# Patient Record
Sex: Female | Born: 1940 | Race: White | Hispanic: No | Smoking: Never smoker
Health system: Southern US, Community
[De-identification: ages and names within clinical notes are randomized; demographics above are authoritative.]

## PROBLEM LIST (undated history)

## (undated) DIAGNOSIS — R7303 Prediabetes: Secondary | ICD-10-CM

## (undated) DIAGNOSIS — T8859XA Other complications of anesthesia, initial encounter: Secondary | ICD-10-CM

## (undated) DIAGNOSIS — C801 Malignant (primary) neoplasm, unspecified: Secondary | ICD-10-CM

## (undated) DIAGNOSIS — D649 Anemia, unspecified: Secondary | ICD-10-CM

## (undated) DIAGNOSIS — J45909 Unspecified asthma, uncomplicated: Secondary | ICD-10-CM

## (undated) DIAGNOSIS — M5090 Cervical disc disorder, unspecified, unspecified cervical region: Secondary | ICD-10-CM

## (undated) DIAGNOSIS — R195 Other fecal abnormalities: Secondary | ICD-10-CM

## (undated) DIAGNOSIS — Z85828 Personal history of other malignant neoplasm of skin: Secondary | ICD-10-CM

## (undated) DIAGNOSIS — J189 Pneumonia, unspecified organism: Secondary | ICD-10-CM

## (undated) DIAGNOSIS — I341 Nonrheumatic mitral (valve) prolapse: Secondary | ICD-10-CM

## (undated) DIAGNOSIS — M1990 Unspecified osteoarthritis, unspecified site: Secondary | ICD-10-CM

## (undated) DIAGNOSIS — G479 Sleep disorder, unspecified: Secondary | ICD-10-CM

## (undated) DIAGNOSIS — R011 Cardiac murmur, unspecified: Secondary | ICD-10-CM

## (undated) DIAGNOSIS — E785 Hyperlipidemia, unspecified: Secondary | ICD-10-CM

## (undated) DIAGNOSIS — T4145XA Adverse effect of unspecified anesthetic, initial encounter: Secondary | ICD-10-CM

## (undated) HISTORY — PX: CATARACT EXTRACTION: SUR2

## (undated) HISTORY — PX: OTHER SURGICAL HISTORY: SHX169

## (undated) HISTORY — PX: KNEE ARTHROSCOPY: SHX127

## (undated) HISTORY — DX: Hyperlipidemia, unspecified: E78.5

## (undated) HISTORY — DX: Unspecified asthma, uncomplicated: J45.909

## (undated) HISTORY — PX: COLONOSCOPY: SHX174

## (undated) HISTORY — PX: ABDOMINAL HYSTERECTOMY: SHX81

## (undated) HISTORY — PX: BREAST ENHANCEMENT SURGERY: SHX7

## (undated) HISTORY — DX: Nonrheumatic mitral (valve) prolapse: I34.1

## (undated) HISTORY — PX: ELBOW SURGERY: SHX618

## (undated) HISTORY — DX: Unspecified osteoarthritis, unspecified site: M19.90

## (undated) HISTORY — PX: AUGMENTATION MAMMAPLASTY: SUR837

## (undated) HISTORY — PX: TUBAL LIGATION: SHX77

---
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
## 1999-01-11 ENCOUNTER — Ambulatory Visit (HOSPITAL_COMMUNITY): Admission: RE | Admit: 1999-01-11 | Discharge: 1999-01-11 | Payer: Self-pay | Admitting: Gynecology

## 1999-01-11 ENCOUNTER — Encounter (INDEPENDENT_AMBULATORY_CARE_PROVIDER_SITE_OTHER): Payer: Self-pay | Admitting: Specialist

## 2000-01-06 ENCOUNTER — Other Ambulatory Visit: Admission: RE | Admit: 2000-01-06 | Discharge: 2000-01-06 | Payer: Self-pay | Admitting: Gynecology

## 2000-02-10 ENCOUNTER — Encounter: Payer: Self-pay | Admitting: Gynecology

## 2000-02-10 ENCOUNTER — Encounter: Admission: RE | Admit: 2000-02-10 | Discharge: 2000-02-10 | Payer: Self-pay | Admitting: Gynecology

## 2001-03-11 ENCOUNTER — Other Ambulatory Visit: Admission: RE | Admit: 2001-03-11 | Discharge: 2001-03-11 | Payer: Self-pay | Admitting: Gynecology

## 2001-08-28 ENCOUNTER — Encounter: Payer: Self-pay | Admitting: Gynecology

## 2001-08-28 ENCOUNTER — Encounter: Payer: Self-pay | Admitting: Internal Medicine

## 2001-08-28 ENCOUNTER — Encounter: Admission: RE | Admit: 2001-08-28 | Discharge: 2001-08-28 | Payer: Self-pay | Admitting: Internal Medicine

## 2001-08-28 ENCOUNTER — Encounter: Admission: RE | Admit: 2001-08-28 | Discharge: 2001-08-28 | Payer: Self-pay | Admitting: Gynecology

## 2001-10-09 ENCOUNTER — Inpatient Hospital Stay (HOSPITAL_COMMUNITY): Admission: RE | Admit: 2001-10-09 | Discharge: 2001-10-12 | Payer: Self-pay | Admitting: Gynecology

## 2001-10-09 ENCOUNTER — Encounter (INDEPENDENT_AMBULATORY_CARE_PROVIDER_SITE_OTHER): Payer: Self-pay | Admitting: Specialist

## 2002-03-17 ENCOUNTER — Other Ambulatory Visit: Admission: RE | Admit: 2002-03-17 | Discharge: 2002-03-17 | Payer: Self-pay | Admitting: Gynecology

## 2002-07-03 DIAGNOSIS — M1990 Unspecified osteoarthritis, unspecified site: Secondary | ICD-10-CM

## 2002-07-03 HISTORY — DX: Unspecified osteoarthritis, unspecified site: M19.90

## 2002-11-21 ENCOUNTER — Encounter: Payer: Self-pay | Admitting: Internal Medicine

## 2002-11-21 ENCOUNTER — Encounter: Admission: RE | Admit: 2002-11-21 | Discharge: 2002-11-21 | Payer: Self-pay | Admitting: Internal Medicine

## 2003-04-06 ENCOUNTER — Other Ambulatory Visit: Admission: RE | Admit: 2003-04-06 | Discharge: 2003-04-06 | Payer: Self-pay | Admitting: Gynecology

## 2003-11-10 ENCOUNTER — Ambulatory Visit (HOSPITAL_COMMUNITY): Admission: RE | Admit: 2003-11-10 | Discharge: 2003-11-10 | Payer: Self-pay | Admitting: *Deleted

## 2003-11-10 ENCOUNTER — Encounter (INDEPENDENT_AMBULATORY_CARE_PROVIDER_SITE_OTHER): Payer: Self-pay | Admitting: Specialist

## 2003-11-24 ENCOUNTER — Encounter: Admission: RE | Admit: 2003-11-24 | Discharge: 2003-11-24 | Payer: Self-pay | Admitting: *Deleted

## 2004-03-04 ENCOUNTER — Encounter: Admission: RE | Admit: 2004-03-04 | Discharge: 2004-03-04 | Payer: Self-pay | Admitting: Gynecology

## 2004-04-06 ENCOUNTER — Other Ambulatory Visit: Admission: RE | Admit: 2004-04-06 | Discharge: 2004-04-06 | Payer: Self-pay | Admitting: Gynecology

## 2004-05-11 ENCOUNTER — Ambulatory Visit: Payer: Self-pay | Admitting: Internal Medicine

## 2004-06-10 ENCOUNTER — Ambulatory Visit: Payer: Self-pay | Admitting: Internal Medicine

## 2005-03-28 ENCOUNTER — Encounter: Admission: RE | Admit: 2005-03-28 | Discharge: 2005-03-28 | Payer: Self-pay | Admitting: Gynecology

## 2005-04-07 ENCOUNTER — Other Ambulatory Visit: Admission: RE | Admit: 2005-04-07 | Discharge: 2005-04-07 | Payer: Self-pay | Admitting: Gynecology

## 2005-07-17 ENCOUNTER — Ambulatory Visit: Payer: Self-pay | Admitting: Family Medicine

## 2005-08-10 ENCOUNTER — Ambulatory Visit: Payer: Self-pay | Admitting: Family Medicine

## 2006-04-09 ENCOUNTER — Other Ambulatory Visit: Admission: RE | Admit: 2006-04-09 | Discharge: 2006-04-09 | Payer: Self-pay | Admitting: Gynecology

## 2007-12-30 ENCOUNTER — Encounter: Admission: RE | Admit: 2007-12-30 | Discharge: 2007-12-30 | Payer: Self-pay | Admitting: Gynecology

## 2008-04-03 ENCOUNTER — Ambulatory Visit: Payer: Self-pay | Admitting: Internal Medicine

## 2008-04-03 DIAGNOSIS — R519 Headache, unspecified: Secondary | ICD-10-CM | POA: Insufficient documentation

## 2008-04-03 DIAGNOSIS — R51 Headache: Secondary | ICD-10-CM

## 2008-04-07 ENCOUNTER — Telehealth (INDEPENDENT_AMBULATORY_CARE_PROVIDER_SITE_OTHER): Payer: Self-pay | Admitting: *Deleted

## 2008-04-21 ENCOUNTER — Encounter: Payer: Self-pay | Admitting: Internal Medicine

## 2008-11-04 ENCOUNTER — Ambulatory Visit: Payer: Self-pay | Admitting: Internal Medicine

## 2008-11-04 DIAGNOSIS — J4520 Mild intermittent asthma, uncomplicated: Secondary | ICD-10-CM

## 2008-11-04 DIAGNOSIS — E785 Hyperlipidemia, unspecified: Secondary | ICD-10-CM | POA: Insufficient documentation

## 2008-11-04 DIAGNOSIS — M1990 Unspecified osteoarthritis, unspecified site: Secondary | ICD-10-CM

## 2008-11-04 DIAGNOSIS — K117 Disturbances of salivary secretion: Secondary | ICD-10-CM

## 2008-11-04 DIAGNOSIS — H04129 Dry eye syndrome of unspecified lacrimal gland: Secondary | ICD-10-CM | POA: Insufficient documentation

## 2008-11-05 LAB — CONVERTED CEMR LAB
Rhuematoid fact SerPl-aCnc: <20 intl units/mL (ref 0.0–20.0)
Sed Rate: 16 mm/hr (ref 0–22)

## 2008-11-06 ENCOUNTER — Encounter (INDEPENDENT_AMBULATORY_CARE_PROVIDER_SITE_OTHER): Payer: Self-pay | Admitting: *Deleted

## 2009-07-14 ENCOUNTER — Emergency Department (HOSPITAL_COMMUNITY): Admission: EM | Admit: 2009-07-14 | Discharge: 2009-07-14 | Payer: Self-pay | Admitting: Emergency Medicine

## 2009-09-07 ENCOUNTER — Ambulatory Visit: Payer: Self-pay | Admitting: Internal Medicine

## 2010-04-04 ENCOUNTER — Encounter: Admission: RE | Admit: 2010-04-04 | Discharge: 2010-04-04 | Payer: Self-pay | Admitting: Gynecology

## 2010-04-28 ENCOUNTER — Encounter: Payer: Self-pay | Admitting: Internal Medicine

## 2010-08-02 NOTE — Letter (Signed)
Summary: Beather Arbour MD  Beather Arbour MD   Imported By: Lanelle Bal 05/17/2010 10:41:47  _____________________________________________________________________  External Attachment:    Type:   Image     Comment:   External Document

## 2010-08-02 NOTE — Assessment & Plan Note (Signed)
Summary: congestion/swh   Vital Signs:  Patient profile:   70 year old female Height:      64.5 inches Weight:      167 pounds BMI:     28.32 O2 Sat:      98 % on Room air Temp:     98.1 degrees F oral Pulse rate:   78 / minute Resp:     17 per minute BP sitting:   118 / 76  (left arm)  Vitals Entered By: Doristine Devoid (September 07, 2009 3:51 PM)  O2 Flow:  Room air CC: some cough and congestion along w/ some nausea    CC:  some cough and congestion along w/ some nausea .  History of Present Illness: Onset as occipital headache 09/03/2009 followed by temp to 100 & chest congestion as of 03/05 w/o sputum. Rx: Vick's gel caps. Flu shot in 04/2009  Allergies: 1)  ! Cortisone 2)  ! Kenalog (Triamcinolone Acetonide)  Review of Systems General:  Complains of fever; denies chills and sweats; Temp to 99. ENT:  Denies nasal congestion and sinus pressure; No frontal headache , facial pain or purulence. Resp:  Complains of shortness of breath and wheezing; denies sputum productive; Some SOB; no PMH of asthma.  Physical Exam  General:  Appears younger than age,in no acute distress; alert,appropriate and cooperative throughout examination Ears:  wax bilaterally Nose:  External nasal examination shows no deformity or inflammation. Nasal mucosa are pink and moist without lesions or exudates. Mouth:  Oral mucosa and oropharynx without lesions or exudates.  Teeth in good repair. Lungs:  Normal respiratory effort, chest expands symmetrically. Lungs : R lung low grade rhonchi > L Heart:  Normal rate and regular rhythm. S1 and S2 normal without gallop, murmur, click, rub. S4 Cervical Nodes:  No lymphadenopathy noted Axillary Nodes:  No palpable lymphadenopathy   Impression & Recommendations:  Problem # 1:  BRONCHITIS-ACUTE (ICD-466.0)  RAD component  Her updated medication list for this problem includes:    Azithromycin 250 Mg Tabs (Azithromycin) .Marland Kitchen... As per pack    Advair Diskus  250-50 Mcg/dose Aepb (Fluticasone-salmeterol) .Marland Kitchen... 1 inhalation every 12 hrs; gargle & spit after use    Ventolin Hfa 108 (90 Base) Mcg/act Aers (Albuterol sulfate) .Marland Kitchen... 1-2 puffs every 4 hrs as needed cough/ wheezing  Complete Medication List: 1)  Estrogel 0.75 Mg/1.25 Gm (0.06%) Gel (Estradiol) .... Rub on arms daily 2)  Acuvail 0.45 % Soln (Ketorolac tromethamine) .Marland Kitchen.. 1 drop in right eye two times a day 3)  Pred Forte 1 % Susp (Prednisolone acetate) .Marland Kitchen.. 1 drop in right eye two times a day 4)  Azithromycin 250 Mg Tabs (Azithromycin) .... As per pack 5)  Advair Diskus 250-50 Mcg/dose Aepb (Fluticasone-salmeterol) .Marland Kitchen.. 1 inhalation every 12 hrs; gargle & spit after use 6)  Ventolin Hfa 108 (90 Base) Mcg/act Aers (Albuterol sulfate) .Marland Kitchen.. 1-2 puffs every 4 hrs as needed cough/ wheezing  Patient Instructions: 1)  Plain Mucinex for thick secretions. 2)  Drink as much fluid as you can tolerate for the next few days. Prescriptions: VENTOLIN HFA 108 (90 BASE) MCG/ACT AERS (ALBUTEROL SULFATE) 1-2 puffs every 4 hrs as needed cough/ wheezing  #1 x 0   Entered and Authorized by:   Marga Melnick MD   Signed by:   Marga Melnick MD on 09/07/2009   Method used:   Samples Given   RxID:   [id number] ADVAIR DISKUS 250-50 MCG/DOSE AEPB (FLUTICASONE-SALMETEROL) 1 inhalation every 12 hrs;  gargle & spit after use  #1 x 0   Entered and Authorized by:   Marga Melnick MD   Signed by:   Marga Melnick MD on 09/07/2009   Method used:   Samples Given   RxID:   [id number] AZITHROMYCIN 250 MG TABS (AZITHROMYCIN) as per pack  #1 x 0   Entered and Authorized by:   Marga Melnick MD   Signed by:   Marga Melnick MD on 09/07/2009   Method used:   Faxed to ...       [facility]  [id number] (retail)       [address]       [place], [place]  [address]       Ph: [phone] or [phone]       Fax: [phone]   RxID:   [id number]

## 2010-11-18 NOTE — Discharge Summary (Signed)
[facility]  Patient:    Isabella Valencia, Isabella Valencia Visit Number: [id number] MRN: [id number]          Service Type: GYN Location: 4W 0457 02 Attending Physician:  Katrina Stack Dictated by:   Jeani Sow, F.N.P. Admit Date:  10/09/2001 Discharge Date: 10/12/2001                             Discharge Summary  HISTORY OF PRESENT ILLNESS:  Ms. Pirani is a 70 year old white female with a history of an endometrial polyp.  She had hysteroscopy, resection of the polyp, and total endometrial resection.  She has not had any vaginal bleeding for several years and now presents with recurrence of spotting consistently. Upon ultrasound examination, she had a strange endometrial echo with thickening in the area suspicious of either endometrioma formation or recurrent endometrial polyp.  She had had endometrial sampling which revealed no evidence of endometrial carcinoma or hyperplasia, but the sampling was limited and it was impossible to be certain that a D&C went to the fundus of the uterus.  She is now admitted for definitive therapy by total abdominal hysterectomy, possible salpingo-oophorectomy.  ADMISSION PHYSICAL EXAMINATION:  CHEST:  Clear to A&P.  HEART:  Rate and rhythm were regular without murmur, gallop, or cardiac enlargement.  ABDOMEN:  Soft and scaphoid without masses or organomegaly.  PELVIC:  External genitalia within normal limits for female.  Vagina thin and atrophic.  Cervix is parous and clean.  Uterus is mid position, normal in size and shape.  Adnexa are bilaterally clear.  Rectovaginal exam confirms. Support is adequate.  IMPRESSION: 1. Abnormal uterine bleeding, recurrent.  Post hysteroscopy and resection of    endometrial polyps and ablation after a prolonged period of amenorrhea. 2. Ultrasound suspicious of endometrioma with normal endometrial sampling. 3. Osteoarthritis. 4. Anemia.  PLAN:  Alternatives had been discussed with the patient  including hysterectomy.  She wishes to proceed with abdominal hysterectomy, possible salpingo-oophorectomy.  Risks and benefits have been discussed with the patient and she accepts these procedures.  LABORATORY DATA:  Admission hemoglobin 13.1, hematocrit 38.7.  On the first postoperative day, hemoglobin was 11.8, hematocrit 34.7.  HOSPITAL COURSE:  Patient underwent total abdominal hysterectomy, bilateral salpingo-oophorectomy under general anesthesia.  The procedure was completed without any complications and the patient was returned to the recovery room in excellent condition.  Pathology report of the uterus - mild chronic cervicitis and hyperkeratosis, benign endometrial stratum both ______ with local inflammation, histiocytic infiltrate and fibrosis consistent with previous biopsy, leiomyomata and adenomyosis, serosal fibrous adhesions.  Bilateral ovaries and fallopian tubes - fibrous adhesions and a benign paratubal cyst. Her postoperative course was complicated on the second postoperative evening with some nausea and vomiting, cramping abdominal pain.  An enema and suppository were given and she had resolution of these symptoms.  She was discharged on the third postoperative day in excellent condition.  Final discharge instructions included no heavy lifting or straining, no vaginal entrance, and increased ambulation as tolerated.  She is to call for any fever of over 100.5 or failure of daily improvement.  MEDICATIONS: 1. Vioxx 25 mg daily. 2. Tylox one p.o. q.4-6h. p.r.n. discomfort. 3. Milk of Magnesia as needed. 4. ______ beta estradiol patch 0.05 mg.  DIET:  Regular.  FOLLOW-UP:  She is to return to the office in one week for follow-up.  CONDITION ON DISCHARGE:  Excellent.  FINAL DISCHARGE DIAGNOSES: 1. Abnormal uterine  bleeding with submucosal leiomyomata. 2. Resection of endometrial polyp and hysteroscopy, resection, ablation, 2000. 3. 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  PROCEDURES PERFORMED:  Total abdominal hysterectomy, bilateral salpingo- oophorectomy under general anesthesia. Dictated by:   Jeani Sow, F.N.P. Attending Physician:  Katrina Stack DD:  10/28/01 TD:  10/28/01 Job: [id number]

## 2010-11-18 NOTE — Op Note (Signed)
[facility]  Patient:    Isabella Valencia, Isabella Valencia Visit Number: [id number] MRN: [id number]          Service Type: GYN Location: 1S X001 01 Attending Physician:  Katrina Stack Dictated by:   Gretta Cool, M.D. Admit Date:  10/09/2001                             Operative Report  PREOPERATIVE DIAGNOSES: 1. Abnormal uterine bleeding with submucosal leiomyomata. 2. Resection of endometrial polyp and hysteroscopy resection ablation 2000.  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  OPERATION:  Total abdominal hysterectomy, bilateral salpingo-oophorectomy.  SURGEON:  Gretta Cool, M.D.  ASSISTANT:  Raynald Kemp, M.D.  ANESTHESIA:  General.  DESCRIPTION OF PROCEDURE:  Under excellent general anesthesia with the patients abdomen prepped and draped as a sterile field with Foley catheter entering her bladder, a Pfannenstiel incision was made.  The incision was extended through the fascia, perineum opened, and the abdomen explored.  There were no abnormalities identified in the upper abdomen.  Examination of the pelvis revealed extensive adhesions of the posterior aspect of the uterus to the rectosigmoid with no evidence of diverticulitis or other pelvic inflammatory process.  She has previous evidence of tubal sterilization procedure with hydrosalpinx formation.  The adhesions were lysed and the uterus mobilized.  The uterus was then grasped by the adnexal structures with Kelly clamps and the round ligament transected by cautery.  The anterior leaf of the broad ligament was then opened and the bladder pushed off the lower segment.  The infundibulopelvic vessels were then skeletonized, isolated from the ureter, clamped, cut, sutured, and tied with 0-Vicryl.  The cardinal and uterosacral ligaments were then progressively skeletonized, clamped, cut, sutured, and tied with 0-Vicryl.  The  cardinal and uterosacrals also were likewise clamped, cut, sutured, and tied with 0-Vicryl.  At this point the vagina was entered and the cervix excised.  The uterosacral and cardinal ligaments were then secured to the angle of the vagina and the cuff closed with running suture of 0-Vicryl.  At this point the cardinal uterosacral colposuspension was performed with O-Ethibond.  At this point the cuff was well supported and pulled far posteriorly.  The cul-de-sac was adequately supported.  At this point the peritoneum was closed with running suture of 2-0 Monocryl.  The packs and instruments were then removed and the abdominoperineal closed with running suture of 0-Monocryl.  The fascia was then approximated with a running suture of 0-Vicryl from ______ in the midline.  The subcutaneous tissue was approximated with interrupted sutures of 3-0 Vicryl and the skin closed with skin staples and Steri-Strips.  At the end of the procedure sponge and lap counts were correct.  There were no complications.  The patient returned to the recovery room in excellent condition. Dictated by:   Gretta Cool, M.D. Attending Physician:  Katrina Stack DD:  10/09/01 TD:  10/09/01 Job: [id number]

## 2010-11-18 NOTE — H&P (Signed)
[facility]  Patient:    Isabella Valencia, Isabella Valencia Visit Number: [id number] MRN: [id number]          Service Type: GYN Location: 4W 0457 02 Attending Physician:  Katrina Stack Dictated by:   Gretta Cool, M.D. Admit Date:  10/09/2001 Discharge Date: 10/12/2001                           History and Physical  CHIEF COMPLAINT:  Abnormal uterine bleeding.  HISTORY OF PRESENT ILLNESS:  Ms. Biss is a 70 year old white female with a history of an endometrial polyp.  She had hysteroscopy, resection of the polyp, and total endometrial resection.  She has not had bleeding for several years and now presents with recurrence of spotting fairly consistently.  She has a very strange endometrial echo with thickening in an area suspicious of either endometrioma formation or recurrent endometrial polyp.  She has had endometrial sampling which revealed no evidence of endometrial adenocarcinoma or hyperplasia, but the sampling was very limited and it was impossible to be certain that the [facility] went to the fundus of the uterus.  She is now admitted for definitive therapy by hysterectomy and possible salpingo-oophorectomy.  PAST MEDICAL HISTORY:  Usual childhood diseases without sequelae.  Medical Illnesses:  None.  PAST SURGICAL HISTORY: 1. Tubal ligation by Raynald Kemp, M.D. 2. Arm surgery by Dr. Leatha Gilding. 3. Breast augmentation in 1981 by Dr. ______ 4. Breast augmentation replacement by Consuello Bossier., M.D., in 1999.  FAMILY HISTORY:  Her father died at age 72 of stroke and dementia.  Her mother is living at age 25.  Two brothers in excellent health.  Her maternal grandmother had colon cancer.  No other known familiar tendency.  HABITS:  Occasional red wine intake.  Denies alcohol and recreation drugs.  SOCIAL HISTORY:  The patient is employed by the [employer] as a Scientist, forensic.  She has one daughter, age 69, who lives at home with  her. Two other grown daughters and living apart.  She is divorced from her husband.  REVIEW OF SYSTEMS:  Denies symptoms.  Cardiorespiratory:  Denies asthma, cough, bronchitis, and shortness of breath.  GU:  Denies urgency and dysuria. GI:  Denies change in bowel habits and food intolerance.  PHYSICAL EXAMINATION:  GENERAL APPEARANCE:  A well-developed, tall, thin, white female.  HEENT:  Pupils equal, round, and reactive to light and accommodation.  Fundi not examined.  Oropharynx clear.  NECK:  Supple without mass or thyroid enlargement.  CHEST:  Clear P to A.  BREASTS:  Soft without mass, nodes, or nipple discharge.  HEART:  Regular rhythm without murmur or cardiac enlargement.  ABDOMEN:  Soft and scaphoid without mass or organomegaly.  PELVIC:  External genitalia normal female.  Vagina thin and atrophic.  The cervix is parous and clear.  Uterine in mid position and normal in size, shape, and contour.  Adnexa clear.  Rectovaginal exam confirms.  Support is adequate.  EXTREMITIES:  Negative.  NEUROLOGIC:  Physiologic.  IMPRESSION: 1. Abnormal uterine bleeding, recurrent, post hysteroscopy and resection of    endometrial polyps and ablation after a prolonged period of amenorrhea.    Ultrasound suspicious of endometrioma.  Normal endometrial sampling. 2. Osteoarthritis. 3. Anemia.  RECOMMENDATIONS:  I have discussed the alternatives with the patient, including hysterectomy.  She wishes to proceed to abdominal hysterectomy and salpingo-oophorectomy.  She understands the risks and benefits and other alternative  forms of therapy, no therapy, etc. Dictated by:   Gretta Cool, M.D. Attending Physician:  Katrina Stack DD:  10/14/01 TD:  10/14/01 Job: [id number]

## 2012-03-27 ENCOUNTER — Encounter: Payer: Self-pay | Admitting: Internal Medicine

## 2012-03-27 ENCOUNTER — Ambulatory Visit (INDEPENDENT_AMBULATORY_CARE_PROVIDER_SITE_OTHER): Payer: Medicare Other | Admitting: Internal Medicine

## 2012-03-27 VITALS — BP 118/74 | HR 94 | Temp 98.3°F | Wt 164.8 lb

## 2012-03-27 DIAGNOSIS — M2669 Other specified disorders of temporomandibular joint: Secondary | ICD-10-CM

## 2012-03-27 DIAGNOSIS — Z23 Encounter for immunization: Secondary | ICD-10-CM

## 2012-03-27 DIAGNOSIS — H6120 Impacted cerumen, unspecified ear: Secondary | ICD-10-CM

## 2012-03-27 DIAGNOSIS — M542 Cervicalgia: Secondary | ICD-10-CM

## 2012-03-27 DIAGNOSIS — H6123 Impacted cerumen, bilateral: Secondary | ICD-10-CM

## 2012-03-27 MED ORDER — TRAMADOL HCL 50 MG PO TABS: 50.0000 mg | ORAL_TABLET | Freq: Four times a day (QID) | ORAL | Status: DC | PRN

## 2012-03-27 NOTE — Patient Instructions (Addendum)
Soft or liquid diet if having pain at the mandible. Use warm moist compresses to 3 times a day over the painful area.Consider glucosamine sulfate 1500 mg daily for your symptoms. Take this daily  for 3 months and then leave it off for 2 months. This will rehydrate the cartilages & soft tissues.  Please do not use Q-tips as we discussed. Should wax build up occur, please put 2-3 drops of mineral oil in the ear at night and cover the canal with a  cotton ball.In the morning fill the canal with hydrogen peroxide & leave  for 10-15 minutes.Following this shower and use the thinnest washrag available to wick out the wax.  If the  symptoms persist or progress; ENT consult recommended.

## 2012-03-27 NOTE — Progress Notes (Signed)
  Subjective:    Patient ID: Isabella Valencia, female    DOB: 1941-02-25, 71 y.o.   MRN: [id number]  HPI Symptoms began approximately 3 weeks ago after eating an apple; she has pain behind the posterior mandible every time she eats now. She has somewhat associated pressure sensation in the ear without tinnitus or hearing loss.  She had a similar problem in the Spring of 2012 which resolved without treatment. She had discussed with her dentist @ that time    Review of Systems She denies associated fever, chills, sweats, frontal headache, facial pain, or nasal purulence.     Objective:   Physical Exam General appearance:good health ;well nourished; no acute distress or increased work of breathing is present.  No  lymphadenopathy about the head, neck, or axilla noted.   Eyes: No conjunctival inflammation or lid edema is present. There is no scleral icterus.EOMI  Ears:  External ear exam shows no significant lesions or deformities.  Otoscopic examination reveals wax obscuring tympanic membranes  Nose:  External nasal examination shows no deformity or inflammation. Nasal mucosa are pink and moist without lesions or exudates. No significant septal dislocation or deviation.No obstruction to airflow.   Oral exam: Dental hygiene is good; lips and gums are healthy appearing.There is no oropharyngeal erythema or exudate noted. There is crepitus over the left temporal mandibular joint with mastication maneuvers. There is no tenderness to palpation over the masseter muscle or asymmetry of the mandible  Neck:  No deformities, thyromegaly, masses, or tenderness noted.   Supple with decreased range of motion with some discomfort with lateral rotation . There is no tenderness to deep palpation behind or under the posterior mandible   .    Extremities:  No cyanosis, edema, or clubbing  noted . 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  Plan: See orders and recommendations

## 2012-03-28 DIAGNOSIS — Z23 Encounter for immunization: Secondary | ICD-10-CM

## 2012-04-03 ENCOUNTER — Telehealth: Payer: Self-pay | Admitting: Internal Medicine

## 2012-04-03 ENCOUNTER — Other Ambulatory Visit: Payer: Self-pay | Admitting: Internal Medicine

## 2012-04-03 DIAGNOSIS — R6884 Jaw pain: Secondary | ICD-10-CM

## 2012-04-03 NOTE — Telephone Encounter (Signed)
Patient states she thinks she needs the ENT referral she discussed with Dr. Alwyn Ren. She is still having trouble swallowing and would prefer to go to Dr. Annalee Genta.

## 2012-04-03 NOTE — Telephone Encounter (Signed)
Referral needs to be entered if approved by Dr. Alwyn Ren please.

## 2012-04-08 ENCOUNTER — Other Ambulatory Visit: Payer: Self-pay | Admitting: Gastroenterology

## 2012-04-08 DIAGNOSIS — R198 Other specified symptoms and signs involving the digestive system and abdomen: Secondary | ICD-10-CM

## 2012-04-08 DIAGNOSIS — R197 Diarrhea, unspecified: Secondary | ICD-10-CM

## 2012-04-17 ENCOUNTER — Ambulatory Visit
Admission: RE | Admit: 2012-04-17 | Discharge: 2012-04-17 | Disposition: A | Discharge status: Home / Self Care | Payer: Medicare Other | Source: Ambulatory Visit | Attending: Gastroenterology | Admitting: Gastroenterology

## 2012-04-17 DIAGNOSIS — R197 Diarrhea, unspecified: Secondary | ICD-10-CM

## 2012-04-17 DIAGNOSIS — R198 Other specified symptoms and signs involving the digestive system and abdomen: Secondary | ICD-10-CM

## 2013-05-14 ENCOUNTER — Other Ambulatory Visit: Payer: Self-pay | Admitting: Dermatology

## 2014-05-13 ENCOUNTER — Ambulatory Visit (INDEPENDENT_AMBULATORY_CARE_PROVIDER_SITE_OTHER): Payer: Medicare Other | Admitting: Internal Medicine

## 2014-05-13 ENCOUNTER — Ambulatory Visit (INDEPENDENT_AMBULATORY_CARE_PROVIDER_SITE_OTHER): Payer: Medicare Other

## 2014-05-13 ENCOUNTER — Encounter: Payer: Self-pay | Admitting: Internal Medicine

## 2014-05-13 VITALS — BP 130/82 | HR 84 | Temp 98.3°F | Resp 12 | Wt 165.0 lb

## 2014-05-13 DIAGNOSIS — R0683 Snoring: Secondary | ICD-10-CM

## 2014-05-13 DIAGNOSIS — Z23 Encounter for immunization: Secondary | ICD-10-CM

## 2014-05-13 DIAGNOSIS — M1711 Unilateral primary osteoarthritis, right knee: Secondary | ICD-10-CM

## 2014-05-13 DIAGNOSIS — I4510 Unspecified right bundle-branch block: Secondary | ICD-10-CM | POA: Insufficient documentation

## 2014-05-13 DIAGNOSIS — Z87898 Personal history of other specified conditions: Secondary | ICD-10-CM

## 2014-05-13 DIAGNOSIS — Z9189 Other specified personal risk factors, not elsewhere classified: Secondary | ICD-10-CM

## 2014-05-13 NOTE — Progress Notes (Signed)
   Subjective:    Patient ID: Isabella Valencia, female    DOB: Nov 03, 1940, 73 y.o.   MRN: [id number]  HPI   She is scheduled for total right  knee replacement in January 2016.  She has end-stage osteoarthritis of the right knee. This compromises her ability to walk as this increases pain . The pain is almost constant and it is disturbing sleep.It's described as a level VII-X on a 10 scale and sharp.  She did have a steroid injection once by Dr. Alvan Dame; this gave no benefit. She does have some improvement with Voltaren gel topically. Aleve has been of benefit but has been associated with some bruising.    She had requested having injections of rooster comb but her orthopedist states that there is no cartilage to replenish.  She is very concerned about the surgery. She says she's had increasing difficulty being awakened following anesthesia postoperatively. She also has a history of snoring. There is no definite history of sleep apnea  She has had symptoms of dry eyes and dry mouth. The latter persists and is associated with halitosis.  Dry eye symptoms  essentially resolved when she quit wearing contacts.  She was evaluated for rheumatologic disease in 2010. At that time her ANA, sedimentation rate, and RA factor were negative or within normal limits  EKG has revealed right bundle branch block.      Review of Systems   Chest pain, palpitations, tachycardia, exertional dyspnea, paroxysmal nocturnal dyspnea, claudication or edema are absent.       Objective:   Physical Exam   Positive or pertinent findings include: She appears much younger then her stated age. Her tongue and oral mucosa are moist. She has mild DIP osteoarthritic change in the hands She has profound fusiform changes of the right knee with marked crepitus. The gait is slightly unstable.  General appearance :adequately nourished; in no distress. Eyes: No conjunctival inflammation or scleral icterus is  present. Oral exam: Dental hygiene is good. Lips and gums are healthy appearing.There is no oropharyngeal crowding,erythema or exudate noted.  Heart:  Normal rate and regular rhythm. S1 and S2 normal without gallop, murmur, click, rub or other extra sounds   Lungs:Chest clear to auscultation; no wheezes, rhonchi,rales ,or rubs present.No increased work of breathing.  Abdomen: bowel sounds normal, soft and non-tender without masses, organomegaly or hernias noted.  No guarding or rebound. No flank tenderness to percussion. Vascular : all pulses equal ; no bruits present. Skin:Warm & dry.  Intact without suspicious lesions or rashes ; no jaundice or tenting Lymphatic: No lymphadenopathy is noted about the head, neck, axilla.            Assessment & Plan:  #1end-stage osteoarthritis right knee  #2 history of difficulty being aroused from anesthesia postoperatively  #3 history of snoring  Plan: Referral will be made to Sleep Medicine to rule out sleep apnea as a perioperative risk.  Otherwise I see no contraindication to the planned surgery.

## 2014-05-13 NOTE — Patient Instructions (Signed)
The Sleep Medicine  referral will be scheduled and you'll be notified of the time.Please call the Referral Co-Ordinator @ [phone] if you have not been notified of appointment time within 7-10 days.

## 2014-05-13 NOTE — Progress Notes (Signed)
Pre visit review using our clinic review tool, if applicable. No additional management support is needed unless otherwise documented below in the visit note. 

## 2014-06-24 ENCOUNTER — Institutional Professional Consult (permissible substitution): Payer: Medicare Other | Admitting: Pulmonary Disease

## 2014-07-06 ENCOUNTER — Encounter (HOSPITAL_COMMUNITY): Admission: RE | Payer: Self-pay | Source: Ambulatory Visit

## 2014-07-06 ENCOUNTER — Inpatient Hospital Stay (HOSPITAL_COMMUNITY): Admission: RE | Admit: 2014-07-06 | Payer: Medicare Other | Source: Ambulatory Visit | Admitting: Orthopedic Surgery

## 2014-07-06 SURGERY — ARTHROPLASTY, KNEE, TOTAL
Anesthesia: Spinal | Site: Knee | Laterality: Right

## 2014-07-15 DIAGNOSIS — M1711 Unilateral primary osteoarthritis, right knee: Secondary | ICD-10-CM | POA: Diagnosis not present

## 2014-07-15 DIAGNOSIS — M25561 Pain in right knee: Secondary | ICD-10-CM | POA: Diagnosis not present

## 2014-08-18 ENCOUNTER — Other Ambulatory Visit: Payer: Self-pay | Admitting: Internal Medicine

## 2014-08-18 DIAGNOSIS — Z87898 Personal history of other specified conditions: Secondary | ICD-10-CM

## 2014-08-20 ENCOUNTER — Ambulatory Visit (INDEPENDENT_AMBULATORY_CARE_PROVIDER_SITE_OTHER): Payer: Medicare Other | Admitting: Internal Medicine

## 2014-08-20 ENCOUNTER — Ambulatory Visit (INDEPENDENT_AMBULATORY_CARE_PROVIDER_SITE_OTHER)
Admission: RE | Admit: 2014-08-20 | Discharge: 2014-08-20 | Disposition: A | Discharge status: Home / Self Care | Payer: Medicare Other | Source: Ambulatory Visit | Attending: Internal Medicine | Admitting: Internal Medicine

## 2014-08-20 ENCOUNTER — Encounter: Payer: Self-pay | Admitting: Internal Medicine

## 2014-08-20 VITALS — BP 100/62 | HR 84 | Ht 64.0 in | Wt 166.4 lb

## 2014-08-20 DIAGNOSIS — J4520 Mild intermittent asthma, uncomplicated: Secondary | ICD-10-CM

## 2014-08-20 DIAGNOSIS — Z01818 Encounter for other preprocedural examination: Secondary | ICD-10-CM | POA: Diagnosis not present

## 2014-08-20 DIAGNOSIS — Z9189 Other specified personal risk factors, not elsewhere classified: Secondary | ICD-10-CM

## 2014-08-20 DIAGNOSIS — Z87898 Personal history of other specified conditions: Secondary | ICD-10-CM

## 2014-08-20 NOTE — Progress Notes (Signed)
   Subjective:    Patient ID: Isabella Valencia, female    DOB: 02/08/1941,    MRN: [id number]  HPI   82 yowf never smoker had trouble waking up from [place] around 2000 p hysterectomy at [facility] now needs R TKR by Alvan Dame and surgery clearance requested by Dr Linna Darner so referred to pulmonary clinic 08/20/2014    08/20/2014 1st [facility] office visit/ Isabella Valencia   Chief Complaint  Patient presents with  . Pulmonary Consult    Needs pulmonary clearance for TKR- right. Pt denies any respiratory co's today.   Not limited by breathing from desired activities but rather by R knee pain but when she was not limited by knee she had no doe, also no daytime hypersomnolence or am ha/ confusion reported.   No obvious day to day or daytime variabilty in breathing or assoc chronic cough or cp or chest tightness, subjective wheeze overt sinus or hb symptoms. No unusual exp hx or h/o childhood pna/ asthma or knowledge of premature birth.  Sleeping ok without nocturnal  or early am exacerbation  of respiratory  c/o's or need for noct saba. Also denies any obvious fluctuation of symptoms with weather or environmental changes or other aggravating or alleviating factors except as outlined above   Current Medications, Allergies, Complete Past Medical History, Past Surgical History, Family History, and Social History were reviewed in [employer] record.            Review of Systems  Constitutional: Negative for fever, chills and unexpected weight change.  HENT: Negative for congestion, dental problem, ear pain, nosebleeds, postnasal drip, rhinorrhea, sinus pressure, sneezing, sore throat, trouble swallowing and voice change.   Eyes: Negative for visual disturbance.  Respiratory: Negative for cough, choking and shortness of breath.   Cardiovascular: Negative for chest pain and leg swelling.  Gastrointestinal: Negative for vomiting, abdominal pain and diarrhea.  Genitourinary: Negative for difficulty  urinating.  Musculoskeletal: Positive for arthralgias.  Skin: Negative for rash.  Neurological: Negative for tremors, syncope and headaches.  Hematological: Does not bruise/bleed easily.       Objective:   Physical Exam   amb wf nad   Wt Readings from Last 3 Encounters:  08/20/14 166 lb 6.4 oz (75.479 kg)  05/13/14 165 lb (74.844 kg)  03/27/12 164 lb 12.8 oz (74.753 kg)    Vital signs reviewed   M I airway   HEENT: nl dentition, turbinates, and orophanx. Nl external ear canals without cough reflex   NECK :  without JVD/Nodes/TM/ nl carotid upstrokes bilaterally   LUNGS: no acc muscle use, clear to A and P bilaterally without cough on insp or exp maneuvers   CV:  RRR  no s3 or murmur or increase in P2, no edema   ABD:  soft and nontender with nl excursion in the supine position. No bruits or organomegaly, bowel sounds nl  MS:  warm without deformities, calf tenderness, cyanosis or clubbing  SKIN: warm and dry without lesions    NEURO:  alert, approp, no deficits   CXR PA and Lateral:   08/20/2014 :     I personally reviewed images and agree with radiology impression as follows:    The heart size and mediastinal contours are within normal limits. Both lungs are clear. The visualized skeletal structures are unremarkable. Breast implants noted.  IMPRESSION: No active cardiopulmonary disease.         Assessment & Plan:

## 2014-08-20 NOTE — Patient Instructions (Signed)
Please remember to go to the xray  department downstairs for your tests - we will call you with the results when they are available.  You are cleared for anesthesia in all forms

## 2014-08-20 NOTE — Assessment & Plan Note (Signed)
No significant hx to suggest active asthma or contraindication to surgery.

## 2014-08-20 NOTE — Assessment & Plan Note (Addendum)
Records are not available in epic but the surgery happened at [facility] where presumably anesthesia has access to the old records but sounds like simple over sedation and nothing "toxic" or prohibitive to repeat anesthesia but obviously based on her age would use the lowest doses tolerated  No hx to suggest OSA but could certainly use periop cpap if any issues with this and PCCM service can see prn.

## 2014-08-21 ENCOUNTER — Telehealth: Payer: Self-pay

## 2014-08-21 ENCOUNTER — Encounter: Payer: Self-pay | Admitting: *Deleted

## 2014-08-21 NOTE — Telephone Encounter (Signed)
-----   Message from Hendricks Limes, MD sent at 08/21/2014  6:20 AM EST ----- Please FAX Dr Gustavus Bryant 08/20/14 office visit to Dr Alvan Dame as pre op clearance. Thanks, [employer]

## 2014-08-21 NOTE — Telephone Encounter (Signed)
08/20/14 office note has been faxed to Dr Alvan Dame at [phone]

## 2014-09-16 NOTE — H&P (Signed)
TOTAL KNEE ADMISSION H&P  Patient is being admitted for right total knee arthroplasty.  Subjective:  Chief Complaint:   Right knee primary OA / pain.  HPI: Isabella Valencia, 74 y.o. female, has a history of pain and functional disability in the right knee due to arthritis and has failed non-surgical conservative treatments for greater than 12 weeks to includeNSAID's and/or analgesics, corticosteriod injections and activity modification.  Onset of symptoms was gradual, starting 5+ years ago with gradually worsening course since that time. The patient noted prior procedures on the knee to include  arthroscopy on the right knee(s).  Patient currently rates pain in the right knee(s) at 8 out of 10 with activity. Patient has night pain, worsening of pain with activity and weight bearing, pain that interferes with activities of daily living, pain with passive range of motion, crepitus and joint swelling.  Patient has evidence of periarticular osteophytes and joint space narrowing by imaging studies. There is no active infection.  Risks, benefits and expectations were discussed with the patient.  Risks including but not limited to the risk of anesthesia, blood clots, nerve damage, blood vessel damage, failure of the prosthesis, infection and up to and including death.  Patient understand the risks, benefits and expectations and wishes to proceed with surgery.   PCP: Unice Cobble, MD  D/C Plans:      SNF [facility])  Post-op Meds:       No Rx given   Tranexamic Acid:      To be given - IV   Decadron:      Is to be given  FYI:     ASA post-op  Norco post-op    Patient Active Problem List   Diagnosis Date Noted  . History of anesthesia reaction 08/20/2014  . RBBB (right bundle branch block) 05/13/2014  . HYPERLIPIDEMIA 11/04/2008  . DRY EYE SYNDROME 11/04/2008  . Intermittent asthma without complication 40/02/6760  . DRY MOUTH 11/04/2008  . OSTEOARTHRITIS 11/04/2008  . HEADACHE 04/03/2008   Past  Medical History  Diagnosis Date  . Arthritis 2004    neg ANA & RA  . Asthma   . Hyperlipidemia   . MVP (mitral valve prolapse)     Past Surgical History  Procedure Laterality Date  . Elbow surgery       X3  . Cataract extraction    . Abdominal hysterectomy    . Tubal ligation    . Cosmetic eye surgery    . Colonoscopy      every 5 years ; Dr Earlean Shawl    No prescriptions prior to admission   Allergies  Allergen Reactions  . Triamcinolone Acetonide     Soft tissue atrophy with injection of Kenalog    History  Substance Use Topics  . Smoking status: Never Smoker   . Smokeless tobacco: Not on file  . Alcohol Use: Yes     Comment:  wine socially    Family History  Problem Relation Age of Onset  . Arthritis Mother   . Colon cancer Mother   . Stroke Father   . Colon cancer Maternal Aunt   . Colon cancer Maternal Grandmother   . Colon polyps Brother      Review of Systems  Constitutional: Negative.   HENT: Negative.   Eyes: Negative.   Respiratory: Negative.   Cardiovascular: Negative.   Gastrointestinal: Negative.   Genitourinary: Negative.   Musculoskeletal: Positive for joint pain.  Skin: Negative.   Neurological: Negative.   Endo/Heme/Allergies:  Negative.   Psychiatric/Behavioral: Negative.     Objective:  Physical Exam  Constitutional: She is oriented to person, place, and time. She appears well-developed and well-nourished.  HENT:  Head: Normocephalic and atraumatic.  Eyes: Pupils are equal, round, and reactive to light.  Neck: Neck supple. No JVD present. No tracheal deviation present. No thyromegaly present.  Cardiovascular: Normal rate, regular rhythm, normal heart sounds and intact distal pulses.   Respiratory: Effort normal and breath sounds normal. No respiratory distress. She has no wheezes.  GI: Soft. There is no tenderness. There is no guarding.  Musculoskeletal:       Right knee: She exhibits decreased range of motion, swelling and bony  tenderness. She exhibits no ecchymosis, no deformity, no laceration and no erythema. Tenderness found.  Lymphadenopathy:    She has no cervical adenopathy.  Neurological: She is alert and oriented to person, place, and time.  Skin: Skin is warm and dry.  Psychiatric: She has a normal mood and affect.     Labs:  Estimated body mass index is 27.90 kg/(m^2) as calculated from the following:   Height as of 09/07/09: 5' 4.5" (1.638 m).   Weight as of 05/13/14: 74.844 kg (165 lb).   Imaging Review Plain radiographs demonstrate moderate degenerative joint disease of the right knee(s). The overall alignment is neutral. The bone quality appears to be good for age and reported activity level.  Assessment/Plan:  End stage arthritis, right knee   The patient history, physical examination, clinical judgment of the provider and imaging studies are consistent with end stage degenerative joint disease of the right knee(s) and total knee arthroplasty is deemed medically necessary. The treatment options including medical management, injection therapy arthroscopy and arthroplasty were discussed at length. The risks and benefits of total knee arthroplasty were presented and reviewed. The risks due to aseptic loosening, infection, stiffness, patella tracking problems, thromboembolic complications and other imponderables were discussed. The patient acknowledged the explanation, agreed to proceed with the plan and consent was signed. Patient is being admitted for inpatient treatment for surgery, pain control, PT, OT, prophylactic antibiotics, VTE prophylaxis, progressive ambulation and ADL's and discharge planning. The patient is planning to be discharged to skilled nursing facility.      West Pugh Crucita Lacorte   PA-C  09/16/2014, 11:50 AM

## 2014-09-23 NOTE — Patient Instructions (Addendum)
Isabella Valencia  09/23/2014   Your procedure is scheduled on: 10/06/14   Report to [facility] Main  Entrance and follow signs to               [name] at 7:00 AM.   Call this number if you have problems the morning of surgery [phone]   Remember:  Do not eat food or drink liquids :After Midnight.   Take these medicines the morning of surgery with A SIP OF WATER: NONE                               You may not have any metal on your body including hair pins and              piercings  Do not wear jewelry, make-up, lotions, powders or perfumes.             Do not wear nail polish.  Do not shave  48 hours prior to surgery.              Men may shave face and neck.   Do not bring valuables to the hospital. Isabella Valencia.  Contacts, dentures or bridgework may not be worn into surgery.  Leave suitcase in the car. After surgery it may be brought to your room.     Patients discharged the day of surgery will not be allowed to drive home.  Name and phone number of your driver:  Special Instructions: N/A              Please read over the following fact sheets you were given: _____________________________________________________________________                                                     Isabella Valencia  Before surgery, you can play an important role.  Because skin is not sterile, your skin needs to be as free of germs as possible.  You can reduce the number of germs on your skin by washing with CHG (chlorahexidine gluconate) soap before surgery.  CHG is an antiseptic cleaner which kills germs and bonds with the skin to continue killing germs even after washing. Please DO NOT use if you have an allergy to CHG or antibacterial soaps.  If your skin becomes reddened/irritated stop using the CHG and inform your nurse when you arrive at Short Stay. Do not shave (including legs and  underarms) for at least 48 hours prior to the first CHG shower.  You may shave your face. Please follow these instructions carefully:   1.  Shower with CHG Soap the night before surgery and the  morning of Surgery.   2.  If you choose to wash your hair, wash your hair first as usual with your  normal  Shampoo.   3.  After you shampoo, rinse your hair and body thoroughly to remove the  shampoo.  4.  Use CHG as you would any other liquid soap.  You can apply chg directly  to the skin and wash . Gently wash with scrungie or clean wascloth    5.  Apply the CHG Soap to your body ONLY FROM THE NECK DOWN.   Do not use on open                           Wound or open sores. Avoid contact with eyes, ears mouth and genitals (private parts).                        Genitals (private parts) with your normal soap.              6.  Wash thoroughly, paying special attention to the area where your surgery  will be performed.   7.  Thoroughly rinse your body with warm water from the neck down.   8.  DO NOT shower/wash with your normal soap after using and rinsing off  the CHG Soap .                9.  Pat yourself dry with a clean towel.             10.  Wear clean pajamas.             11.  Place clean sheets on your bed the night of your first shower and do not  sleep with pets.  Day of Surgery : Do not apply any lotions/deodorants the morning of surgery.  Please wear clean clothes to the hospital/surgery center.  FAILURE TO FOLLOW THESE INSTRUCTIONS MAY RESULT IN THE CANCELLATION OF YOUR SURGERY    PATIENT SIGNATURE_________________________________  ______________________________________________________________________     Isabella Valencia  An incentive spirometer is a tool that can help keep your lungs clear and active. This tool measures how well you are filling your lungs with each breath. Taking long deep breaths may help reverse or decrease  the chance of developing breathing (pulmonary) problems (especially infection) following:  A long period of time when you are unable to move or be active. BEFORE THE PROCEDURE   If the spirometer includes an indicator to show your best effort, your nurse or respiratory therapist will set it to a desired goal.  If possible, sit up straight or lean slightly forward. Try not to slouch.  Hold the incentive spirometer in an upright position. INSTRUCTIONS FOR USE   Sit on the edge of your bed if possible, or sit up as far as you can in bed or on a chair.  Hold the incentive spirometer in an upright position.  Breathe out normally.  Place the mouthpiece in your mouth and seal your lips tightly around it.  Breathe in slowly and as deeply as possible, raising the piston or the ball toward the top of the column.  Hold your breath for 3-5 seconds or for as long as possible. Allow the piston or ball to fall to the bottom of the column.  Remove the mouthpiece from your mouth and breathe out normally.  Rest for a few seconds and repeat Steps 1 through 7 at least 10 times every 1-2 hours when you are awake. Take your time and take a few normal breaths between deep breaths.  The spirometer may include an indicator to show your best effort. Use the indicator as a goal to work toward during  each repetition.  After each set of 10 deep breaths, practice coughing to be sure your lungs are clear. If you have an incision (the cut made at the time of surgery), support your incision when coughing by placing a pillow or rolled up towels firmly against it. Once you are able to get out of bed, walk around indoors and cough well. You may stop using the incentive spirometer when instructed by your caregiver.  RISKS AND COMPLICATIONS  Take your time so you do not get dizzy or light-headed.  If you are in pain, you may need to take or ask for pain medication before doing incentive spirometry. It is harder to  take a deep breath if you are having pain. AFTER USE  Rest and breathe slowly and easily.  It can be helpful to keep track of a log of your progress. Your caregiver can provide you with a simple table to help with this. If you are using the spirometer at home, follow these instructions: Isabella Valencia IF:   You are having difficultly using the spirometer.  You have trouble using the spirometer as often as instructed.  Your pain medication is not giving enough relief while using the spirometer.  You develop fever of 100.5 F (38.1 C) or higher. SEEK IMMEDIATE MEDICAL CARE IF:   You cough up bloody sputum that had not been present before.  You develop fever of 102 F (38.9 C) or greater.  You develop worsening pain at or near the incision site. MAKE SURE YOU:   Understand these instructions.  Will watch your condition.  Will get help right away if you are not doing well or get worse. Document Released: 10/30/2006 Document Revised: 09/11/2011 Document Reviewed: 12/31/2006 ExitCare Patient Information 2014 ExitCare, [place].   ________________________________________________________________________  WHAT IS A BLOOD TRANSFUSION? Blood Transfusion Information  A transfusion is the replacement of blood or some of its parts. Blood is made up of multiple cells which provide different functions.  Red blood cells carry oxygen and are used for blood loss replacement.  White blood cells fight against infection.  Platelets control bleeding.  Plasma helps clot blood.  Other blood products are available for specialized needs, such as hemophilia or other clotting disorders. BEFORE THE TRANSFUSION  Who gives blood for transfusions?   Healthy volunteers who are fully evaluated to make sure their blood is safe. This is blood bank blood. Transfusion therapy is the safest it has ever been in the practice of medicine. Before blood is taken from a donor, a complete history is taken to  make sure that person has no history of diseases nor engages in risky social behavior (examples are intravenous drug use or sexual activity with multiple partners). The donor's travel history is screened to minimize risk of transmitting infections, such as malaria. The donated blood is tested for signs of infectious diseases, such as HIV and hepatitis. The blood is then tested to be sure it is compatible with you in order to minimize the chance of a transfusion reaction. If you or a relative donates blood, this is often done in anticipation of surgery and is not appropriate for emergency situations. It takes many days to process the donated blood. RISKS AND COMPLICATIONS Although transfusion therapy is very safe and saves many lives, the main dangers of transfusion include:   Getting an infectious disease.  Developing a transfusion reaction. This is an allergic reaction to something in the blood you were given. Every precaution is taken to prevent  this. The decision to have a blood transfusion has been considered carefully by your caregiver before blood is given. Blood is not given unless the benefits outweigh the risks. AFTER THE TRANSFUSION  Right after receiving a blood transfusion, you will usually feel much better and more energetic. This is especially true if your red blood cells have gotten low (anemic). The transfusion raises the level of the red blood cells which carry oxygen, and this usually causes an energy increase.  The nurse administering the transfusion will monitor you carefully for complications. HOME CARE INSTRUCTIONS  No special instructions are needed after a transfusion. You may find your energy is better. Speak with your caregiver about any limitations on activity for underlying diseases you may have. SEEK MEDICAL CARE IF:   Your condition is not improving after your transfusion.  You develop redness or irritation at the intravenous (IV) site. SEEK IMMEDIATE MEDICAL CARE  IF:  Any of the following symptoms occur over the next 12 hours:  Shaking chills.  You have a temperature by mouth above 102 F (38.9 C), not controlled by medicine.  Chest, back, or muscle pain.  People around you feel you are not acting correctly or are confused.  Shortness of breath or difficulty breathing.  Dizziness and fainting.  You get a rash or develop hives.  You have a decrease in urine output.  Your urine turns a dark color or changes to pink, red, or brown. Any of the following symptoms occur over the next 10 days:  You have a temperature by mouth above 102 F (38.9 C), not controlled by medicine.  Shortness of breath.  Weakness after normal activity.  The white part of the eye turns yellow (jaundice).  You have a decrease in the amount of urine or are urinating less often.  Your urine turns a dark color or changes to pink, red, or brown. Document Released: 06/16/2000 Document Revised: 09/11/2011 Document Reviewed: 02/03/2008 [facility] Patient Information 2014 [place], [place].  _______________________________________________________________________

## 2014-09-28 ENCOUNTER — Encounter (HOSPITAL_COMMUNITY): Payer: Self-pay

## 2014-09-28 ENCOUNTER — Other Ambulatory Visit (HOSPITAL_COMMUNITY): Payer: Self-pay | Admitting: *Deleted

## 2014-09-28 ENCOUNTER — Encounter (HOSPITAL_COMMUNITY)
Admission: RE | Admit: 2014-09-28 | Discharge: 2014-09-28 | Disposition: A | Discharge status: Home / Self Care | Payer: Medicare Other | Source: Ambulatory Visit | Attending: Orthopedic Surgery | Admitting: Orthopedic Surgery

## 2014-09-28 DIAGNOSIS — I341 Nonrheumatic mitral (valve) prolapse: Secondary | ICD-10-CM | POA: Insufficient documentation

## 2014-09-28 DIAGNOSIS — Z01812 Encounter for preprocedural laboratory examination: Secondary | ICD-10-CM | POA: Diagnosis not present

## 2014-09-28 DIAGNOSIS — Z01810 Encounter for preprocedural cardiovascular examination: Secondary | ICD-10-CM | POA: Insufficient documentation

## 2014-09-28 HISTORY — DX: Other complications of anesthesia, initial encounter: T88.59XA

## 2014-09-28 HISTORY — DX: Sleep disorder, unspecified: G47.9

## 2014-09-28 HISTORY — DX: Personal history of other malignant neoplasm of skin: Z85.828

## 2014-09-28 LAB — BASIC METABOLIC PANEL
Anion gap: 9 (ref 5–15)
BUN: 13 mg/dL (ref 6–23)
CHLORIDE: 103 mmol/L (ref 96–112)
CO2: 30 mmol/L (ref 19–32)
CREATININE: 0.79 mg/dL (ref 0.50–1.10)
Calcium: 9.8 mg/dL (ref 8.4–10.5)
GFR calc Af Amer: >90 mL/min (ref >90)
GFR, EST NON AFRICAN AMERICAN: 80 mL/min — AB (ref >90)
Glucose, Bld: 94 mg/dL (ref 70–99)
Potassium: 4.8 mmol/L (ref 3.5–5.1)
SODIUM: 142 mmol/L (ref 135–145)

## 2014-09-28 LAB — SURGICAL PCR SCREEN
MRSA, PCR: NEGATIVE
Staphylococcus aureus: POSITIVE — AB

## 2014-09-28 LAB — CBC
HEMATOCRIT: 39.7 % (ref 36.0–46.0)
Hemoglobin: 12.9 g/dL (ref 12.0–15.0)
MCH: 32.6 pg (ref 26.0–34.0)
MCHC: 32.5 g/dL (ref 30.0–36.0)
MCV: 100.3 fL — ABNORMAL HIGH (ref 78.0–100.0)
Platelets: 292 10*3/uL (ref 150–400)
RBC: 3.96 MIL/uL (ref 3.87–5.11)
RDW: 12.8 % (ref 11.5–15.5)
WBC: 5.1 10*3/uL (ref 4.0–10.5)

## 2014-09-28 LAB — PROTIME-INR
INR: 0.92 (ref 0.00–1.49)
Prothrombin Time: 12.5 seconds (ref 11.6–15.2)

## 2014-09-28 LAB — APTT: aPTT: 27 seconds (ref 24–37)

## 2014-09-28 LAB — URINALYSIS, ROUTINE W REFLEX MICROSCOPIC
BILIRUBIN URINE: NEGATIVE
GLUCOSE, UA: NEGATIVE mg/dL
HGB URINE DIPSTICK: NEGATIVE
Ketones, ur: NEGATIVE mg/dL
LEUKOCYTES UA: NEGATIVE
NITRITE: NEGATIVE
PROTEIN: NEGATIVE mg/dL
Specific Gravity, Urine: 1.019 (ref 1.005–1.030)
Urobilinogen, UA: 1 mg/dL (ref 0.0–1.0)
pH: 6.5 (ref 5.0–8.0)

## 2014-09-29 LAB — ABO/RH: ABO/RH(D): A NEG

## 2014-10-06 ENCOUNTER — Inpatient Hospital Stay (HOSPITAL_COMMUNITY): Payer: Medicare Other | Admitting: Anesthesiology

## 2014-10-06 ENCOUNTER — Encounter (HOSPITAL_COMMUNITY): Payer: Self-pay | Admitting: *Deleted

## 2014-10-06 ENCOUNTER — Inpatient Hospital Stay (HOSPITAL_COMMUNITY)
Admission: RE | Admit: 2014-10-06 | Discharge: 2014-10-09 | DRG: 470 | Disposition: A | Discharge status: Skilled Nursing Facility | Payer: Medicare Other | Source: Ambulatory Visit | Attending: Orthopedic Surgery | Admitting: Orthopedic Surgery

## 2014-10-06 ENCOUNTER — Encounter (HOSPITAL_COMMUNITY)
Admission: RE | Disposition: A | Discharge status: Skilled Nursing Facility | Payer: Self-pay | Source: Ambulatory Visit | Attending: Orthopedic Surgery

## 2014-10-06 DIAGNOSIS — M1711 Unilateral primary osteoarthritis, right knee: Secondary | ICD-10-CM | POA: Diagnosis not present

## 2014-10-06 DIAGNOSIS — Z23 Encounter for immunization: Secondary | ICD-10-CM

## 2014-10-06 DIAGNOSIS — I4510 Unspecified right bundle-branch block: Secondary | ICD-10-CM | POA: Diagnosis not present

## 2014-10-06 DIAGNOSIS — Z85828 Personal history of other malignant neoplasm of skin: Secondary | ICD-10-CM

## 2014-10-06 DIAGNOSIS — R278 Other lack of coordination: Secondary | ICD-10-CM | POA: Diagnosis not present

## 2014-10-06 DIAGNOSIS — J4520 Mild intermittent asthma, uncomplicated: Secondary | ICD-10-CM | POA: Diagnosis present

## 2014-10-06 DIAGNOSIS — Z823 Family history of stroke: Secondary | ICD-10-CM

## 2014-10-06 DIAGNOSIS — E785 Hyperlipidemia, unspecified: Secondary | ICD-10-CM | POA: Diagnosis present

## 2014-10-06 DIAGNOSIS — Z96659 Presence of unspecified artificial knee joint: Secondary | ICD-10-CM

## 2014-10-06 DIAGNOSIS — Z800 Family history of malignant neoplasm of digestive organs: Secondary | ICD-10-CM | POA: Diagnosis not present

## 2014-10-06 DIAGNOSIS — E663 Overweight: Secondary | ICD-10-CM | POA: Diagnosis present

## 2014-10-06 DIAGNOSIS — Z96651 Presence of right artificial knee joint: Secondary | ICD-10-CM

## 2014-10-06 DIAGNOSIS — I349 Nonrheumatic mitral valve disorder, unspecified: Secondary | ICD-10-CM | POA: Diagnosis not present

## 2014-10-06 DIAGNOSIS — M179 Osteoarthritis of knee, unspecified: Secondary | ICD-10-CM | POA: Diagnosis not present

## 2014-10-06 DIAGNOSIS — M6281 Muscle weakness (generalized): Secondary | ICD-10-CM | POA: Diagnosis not present

## 2014-10-06 DIAGNOSIS — R2681 Unsteadiness on feet: Secondary | ICD-10-CM | POA: Diagnosis not present

## 2014-10-06 DIAGNOSIS — Z471 Aftercare following joint replacement surgery: Secondary | ICD-10-CM | POA: Diagnosis not present

## 2014-10-06 HISTORY — PX: TOTAL KNEE ARTHROPLASTY: SHX125

## 2014-10-06 LAB — TYPE AND SCREEN
ABO/RH(D): A NEG
ANTIBODY SCREEN: NEGATIVE
DAT, IGG: NEGATIVE

## 2014-10-06 SURGERY — ARTHROPLASTY, KNEE, TOTAL
Anesthesia: Spinal | Site: Knee | Laterality: Right

## 2014-10-06 MED ORDER — POTASSIUM CHLORIDE 2 MEQ/ML IV SOLN
INTRAVENOUS | Status: DC
  Administered 2014-10-06 – 2014-10-07 (×2): via INTRAVENOUS
  Filled 2014-10-06 (×9): qty 1000

## 2014-10-06 MED ORDER — ALUM & MAG HYDROXIDE-SIMETH 200-200-20 MG/5ML PO SUSP: 30.0000 mL | ORAL | Status: DC | PRN

## 2014-10-06 MED ORDER — ASPIRIN EC 325 MG PO TBEC
325.0000 mg | DELAYED_RELEASE_TABLET | Freq: Two times a day (BID) | ORAL | Status: DC
  Administered 2014-10-07 – 2014-10-09 (×5): 325 mg via ORAL
  Filled 2014-10-06 (×7): qty 1

## 2014-10-06 MED ORDER — SODIUM CHLORIDE 0.9 % IJ SOLN
INTRAMUSCULAR | Status: AC
  Filled 2014-10-06: qty 50

## 2014-10-06 MED ORDER — CELECOXIB 200 MG PO CAPS
200.0000 mg | ORAL_CAPSULE | Freq: Two times a day (BID) | ORAL | Status: DC
  Administered 2014-10-06 – 2014-10-09 (×7): 200 mg via ORAL
  Filled 2014-10-06 (×7): qty 1

## 2014-10-06 MED ORDER — LACTATED RINGERS IV SOLN
INTRAVENOUS | Status: DC | PRN
  Administered 2014-10-06 (×3): via INTRAVENOUS

## 2014-10-06 MED ORDER — MIDAZOLAM HCL 2 MG/2ML IJ SOLN
INTRAMUSCULAR | Status: AC
  Filled 2014-10-06: qty 2

## 2014-10-06 MED ORDER — BUPIVACAINE-EPINEPHRINE (PF) 0.25% -1:200000 IJ SOLN
INTRAMUSCULAR | Status: AC
  Filled 2014-10-06: qty 30

## 2014-10-06 MED ORDER — FERROUS SULFATE 325 (65 FE) MG PO TABS
325.0000 mg | ORAL_TABLET | Freq: Three times a day (TID) | ORAL | Status: DC
  Administered 2014-10-06 – 2014-10-08 (×6): 325 mg via ORAL
  Filled 2014-10-06 (×11): qty 1

## 2014-10-06 MED ORDER — SODIUM CHLORIDE 0.9 % IJ SOLN
INTRAMUSCULAR | Status: DC | PRN
  Administered 2014-10-06: 29 mL

## 2014-10-06 MED ORDER — HYDROCODONE-ACETAMINOPHEN 7.5-325 MG PO TABS
1.0000 | ORAL_TABLET | ORAL | Status: DC
  Administered 2014-10-06 (×2): 2 via ORAL
  Administered 2014-10-06 – 2014-10-07 (×2): 1 via ORAL
  Administered 2014-10-07: 2 via ORAL
  Administered 2014-10-07 (×4): 1 via ORAL
  Administered 2014-10-08 – 2014-10-09 (×7): 2 via ORAL
  Filled 2014-10-06: qty 1
  Filled 2014-10-06: qty 2
  Filled 2014-10-06: qty 1
  Filled 2014-10-06 (×3): qty 2
  Filled 2014-10-06: qty 1
  Filled 2014-10-06 (×3): qty 2
  Filled 2014-10-06 (×2): qty 1
  Filled 2014-10-06: qty 2
  Filled 2014-10-06: qty 1
  Filled 2014-10-06 (×2): qty 2

## 2014-10-06 MED ORDER — CEFAZOLIN SODIUM-DEXTROSE 2-3 GM-% IV SOLR
2.0000 g | INTRAVENOUS | Status: AC
  Administered 2014-10-06: 2 g via INTRAVENOUS

## 2014-10-06 MED ORDER — METHOCARBAMOL 1000 MG/10ML IJ SOLN
500.0000 mg | Freq: Four times a day (QID) | INTRAVENOUS | Status: DC | PRN
  Administered 2014-10-06: 500 mg via INTRAVENOUS
  Filled 2014-10-06 (×2): qty 5

## 2014-10-06 MED ORDER — BUPIVACAINE IN DEXTROSE 0.75-8.25 % IT SOLN
INTRATHECAL | Status: DC | PRN
  Administered 2014-10-06: 1.5 mL via INTRATHECAL

## 2014-10-06 MED ORDER — ONDANSETRON HCL 4 MG/2ML IJ SOLN
4.0000 mg | Freq: Four times a day (QID) | INTRAMUSCULAR | Status: DC | PRN
  Administered 2014-10-06 – 2014-10-08 (×2): 4 mg via INTRAVENOUS
  Filled 2014-10-06 (×2): qty 2

## 2014-10-06 MED ORDER — PHENOL 1.4 % MT LIQD: 1.0000 | OROMUCOSAL | Status: DC | PRN

## 2014-10-06 MED ORDER — DOCUSATE SODIUM 100 MG PO CAPS
100.0000 mg | ORAL_CAPSULE | Freq: Two times a day (BID) | ORAL | Status: DC
  Administered 2014-10-06 – 2014-10-09 (×6): 100 mg via ORAL

## 2014-10-06 MED ORDER — LIDOCAINE HCL (CARDIAC) 20 MG/ML IV SOLN
INTRAVENOUS | Status: DC | PRN
  Administered 2014-10-06: 50 mg via INTRAVENOUS

## 2014-10-06 MED ORDER — DIPHENHYDRAMINE HCL 25 MG PO CAPS: 25.0000 mg | ORAL_CAPSULE | Freq: Four times a day (QID) | ORAL | Status: DC | PRN

## 2014-10-06 MED ORDER — DEXAMETHASONE SODIUM PHOSPHATE 10 MG/ML IJ SOLN
10.0000 mg | Freq: Once | INTRAMUSCULAR | Status: AC
  Administered 2014-10-06: 10 mg via INTRAVENOUS

## 2014-10-06 MED ORDER — PROPOFOL 10 MG/ML IV BOLUS
INTRAVENOUS | Status: AC
  Filled 2014-10-06: qty 20

## 2014-10-06 MED ORDER — LACTATED RINGERS IV SOLN: INTRAVENOUS | Status: DC

## 2014-10-06 MED ORDER — PROPOFOL INFUSION 10 MG/ML OPTIME
INTRAVENOUS | Status: DC | PRN
  Administered 2014-10-06: 75 ug/kg/min via INTRAVENOUS

## 2014-10-06 MED ORDER — BUPIVACAINE-EPINEPHRINE (PF) 0.25% -1:200000 IJ SOLN
INTRAMUSCULAR | Status: DC | PRN
  Administered 2014-10-06: 30 mL

## 2014-10-06 MED ORDER — POLYETHYLENE GLYCOL 3350 17 G PO PACK
17.0000 g | PACK | Freq: Two times a day (BID) | ORAL | Status: DC
  Administered 2014-10-06: 17 g via ORAL

## 2014-10-06 MED ORDER — LIDOCAINE HCL (CARDIAC) 20 MG/ML IV SOLN
INTRAVENOUS | Status: AC
  Filled 2014-10-06: qty 5

## 2014-10-06 MED ORDER — KETOROLAC TROMETHAMINE 30 MG/ML IJ SOLN
INTRAMUSCULAR | Status: DC | PRN
  Administered 2014-10-06: 30 mg

## 2014-10-06 MED ORDER — FENTANYL CITRATE 0.05 MG/ML IJ SOLN
INTRAMUSCULAR | Status: AC
  Filled 2014-10-06: qty 2

## 2014-10-06 MED ORDER — HYDROMORPHONE HCL 1 MG/ML IJ SOLN: 0.2500 mg | INTRAMUSCULAR | Status: DC | PRN

## 2014-10-06 MED ORDER — METOCLOPRAMIDE HCL 10 MG PO TABS
5.0000 mg | ORAL_TABLET | Freq: Three times a day (TID) | ORAL | Status: DC | PRN
  Administered 2014-10-07 – 2014-10-08 (×2): 10 mg via ORAL
  Filled 2014-10-06 (×2): qty 1

## 2014-10-06 MED ORDER — FENTANYL CITRATE 0.05 MG/ML IJ SOLN
INTRAMUSCULAR | Status: DC | PRN
  Administered 2014-10-06: 100 ug via INTRAVENOUS

## 2014-10-06 MED ORDER — CHLORHEXIDINE GLUCONATE 4 % EX LIQD: 60.0000 mL | Freq: Once | CUTANEOUS | Status: DC

## 2014-10-06 MED ORDER — MAGNESIUM CITRATE PO SOLN: 1.0000 | Freq: Once | ORAL | Status: AC | PRN

## 2014-10-06 MED ORDER — METOCLOPRAMIDE HCL 5 MG/ML IJ SOLN
5.0000 mg | Freq: Three times a day (TID) | INTRAMUSCULAR | Status: DC | PRN
  Administered 2014-10-06 – 2014-10-09 (×3): 10 mg via INTRAVENOUS
  Filled 2014-10-06 (×3): qty 2

## 2014-10-06 MED ORDER — METHOCARBAMOL 500 MG PO TABS
500.0000 mg | ORAL_TABLET | Freq: Four times a day (QID) | ORAL | Status: DC | PRN
  Administered 2014-10-06 – 2014-10-08 (×5): 500 mg via ORAL
  Filled 2014-10-06 (×5): qty 1

## 2014-10-06 MED ORDER — MENTHOL 3 MG MT LOZG: 1.0000 | LOZENGE | OROMUCOSAL | Status: DC | PRN

## 2014-10-06 MED ORDER — ONDANSETRON HCL 4 MG PO TABS
4.0000 mg | ORAL_TABLET | Freq: Four times a day (QID) | ORAL | Status: DC | PRN
  Administered 2014-10-08 – 2014-10-09 (×2): 4 mg via ORAL
  Filled 2014-10-06 (×2): qty 1

## 2014-10-06 MED ORDER — ONDANSETRON HCL 4 MG/2ML IJ SOLN
INTRAMUSCULAR | Status: DC | PRN
  Administered 2014-10-06: 4 mg via INTRAVENOUS

## 2014-10-06 MED ORDER — SODIUM CHLORIDE 0.9 % IR SOLN
Status: DC | PRN
Start: 2014-10-06 — End: 2014-10-06
  Administered 2014-10-06: 1000 mL

## 2014-10-06 MED ORDER — SODIUM CHLORIDE 0.9 % IV SOLN
1000.0000 mg | Freq: Once | INTRAVENOUS | Status: AC
  Administered 2014-10-06: 1000 mg via INTRAVENOUS
  Filled 2014-10-06: qty 10

## 2014-10-06 MED ORDER — HYDROMORPHONE HCL 1 MG/ML IJ SOLN
0.5000 mg | INTRAMUSCULAR | Status: DC | PRN
  Administered 2014-10-06: 0.5 mg via INTRAVENOUS
  Administered 2014-10-06: 1 mg via INTRAVENOUS
  Administered 2014-10-08: 0.5 mg via INTRAVENOUS
  Filled 2014-10-06 (×4): qty 1

## 2014-10-06 MED ORDER — BISACODYL 10 MG RE SUPP: 10.0000 mg | Freq: Every day | RECTAL | Status: DC | PRN

## 2014-10-06 MED ORDER — PNEUMOCOCCAL VAC POLYVALENT 25 MCG/0.5ML IJ INJ
0.5000 mL | INJECTION | INTRAMUSCULAR | Status: DC
  Filled 2014-10-06 (×3): qty 0.5

## 2014-10-06 MED ORDER — CEFAZOLIN SODIUM-DEXTROSE 2-3 GM-% IV SOLR
INTRAVENOUS | Status: AC
  Filled 2014-10-06: qty 50

## 2014-10-06 MED ORDER — CEFAZOLIN SODIUM-DEXTROSE 2-3 GM-% IV SOLR
2.0000 g | Freq: Four times a day (QID) | INTRAVENOUS | Status: AC
  Administered 2014-10-06 (×2): 2 g via INTRAVENOUS
  Filled 2014-10-06 (×2): qty 50

## 2014-10-06 MED ORDER — KETOROLAC TROMETHAMINE 30 MG/ML IJ SOLN
INTRAMUSCULAR | Status: AC
  Filled 2014-10-06: qty 1

## 2014-10-06 MED ORDER — MIDAZOLAM HCL 5 MG/5ML IJ SOLN
INTRAMUSCULAR | Status: DC | PRN
  Administered 2014-10-06: 2 mg via INTRAVENOUS

## 2014-10-06 SURGICAL SUPPLY — 58 items
BAG DECANTER FOR FLEXI CONT (MISCELLANEOUS) IMPLANT
BAG SPEC THK2 15X12 ZIP CLS (MISCELLANEOUS)
BAG ZIPLOCK 12X15 (MISCELLANEOUS) IMPLANT
BANDAGE ELASTIC 6 VELCRO ST LF (GAUZE/BANDAGES/DRESSINGS) ×3 IMPLANT
BANDAGE ESMARK 6X9 LF (GAUZE/BANDAGES/DRESSINGS) ×1 IMPLANT
BLADE SAW SGTL 13.0X1.19X90.0M (BLADE) ×3 IMPLANT
BNDG CMPR 9X6 STRL LF SNTH (GAUZE/BANDAGES/DRESSINGS) ×1
BNDG ESMARK 6X9 LF (GAUZE/BANDAGES/DRESSINGS) ×3
BOWL SMART MIX CTS (DISPOSABLE) ×3 IMPLANT
CAP KNEE TOTAL 3 SIGMA ×2 IMPLANT
CEMENT HV SMART SET (Cement) ×4 IMPLANT
CUFF TOURN SGL QUICK 34 (TOURNIQUET CUFF) ×3
CUFF TRNQT CYL 34X4X40X1 (TOURNIQUET CUFF) ×1 IMPLANT
DECANTER SPIKE VIAL GLASS SM (MISCELLANEOUS) ×3 IMPLANT
DRAPE EXTREMITY T 121X128X90 (DRAPE) ×3 IMPLANT
DRAPE POUCH INSTRU U-SHP 10X18 (DRAPES) ×3 IMPLANT
DRAPE U-SHAPE 47X51 STRL (DRAPES) ×3 IMPLANT
DRSG AQUACEL AG ADV 3.5X10 (GAUZE/BANDAGES/DRESSINGS) ×3 IMPLANT
DURAPREP 26ML APPLICATOR (WOUND CARE) ×6 IMPLANT
ELECT REM PT RETURN 9FT ADLT (ELECTROSURGICAL) ×3
ELECTRODE REM PT RTRN 9FT ADLT (ELECTROSURGICAL) ×1 IMPLANT
FACESHIELD WRAPAROUND (MASK) ×15 IMPLANT
FACESHIELD WRAPAROUND OR TEAM (MASK) ×5 IMPLANT
GLOVE BIOGEL PI IND STRL 7.5 (GLOVE) ×1 IMPLANT
GLOVE BIOGEL PI IND STRL 8.5 (GLOVE) ×1 IMPLANT
GLOVE BIOGEL PI INDICATOR 7.5 (GLOVE) ×2
GLOVE BIOGEL PI INDICATOR 8.5 (GLOVE) ×2
GLOVE ECLIPSE 8.0 STRL XLNG CF (GLOVE) ×1 IMPLANT
GLOVE ORTHO TXT STRL SZ7.5 (GLOVE) ×2 IMPLANT
GLOVE SURG SS PI 7.5 STRL IVOR (GLOVE) ×2 IMPLANT
GLOVE SURG SS PI 8.0 STRL IVOR (GLOVE) ×2 IMPLANT
GOWN SPEC L3 XXLG W/TWL (GOWN DISPOSABLE) ×3 IMPLANT
GOWN STRL REUS W/TWL LRG LVL3 (GOWN DISPOSABLE) ×3 IMPLANT
HANDPIECE INTERPULSE COAX TIP (DISPOSABLE) ×3
KIT BASIN OR (CUSTOM PROCEDURE TRAY) ×3 IMPLANT
LIQUID BAND (GAUZE/BANDAGES/DRESSINGS) ×3 IMPLANT
MANIFOLD NEPTUNE II (INSTRUMENTS) ×3 IMPLANT
NDL SAFETY ECLIPSE 18X1.5 (NEEDLE) ×1 IMPLANT
NEEDLE HYPO 18GX1.5 SHARP (NEEDLE) ×3
PACK TOTAL JOINT (CUSTOM PROCEDURE TRAY) ×3 IMPLANT
PEN SKIN MARKING BROAD (MISCELLANEOUS) ×3 IMPLANT
POSITIONER SURGICAL ARM (MISCELLANEOUS) ×3 IMPLANT
SET HNDPC FAN SPRY TIP SCT (DISPOSABLE) ×1 IMPLANT
SET PAD KNEE POSITIONER (MISCELLANEOUS) ×3 IMPLANT
SUCTION FRAZIER 12FR DISP (SUCTIONS) ×5 IMPLANT
SUT MNCRL AB 4-0 PS2 18 (SUTURE) ×3 IMPLANT
SUT VIC AB 1 CT1 36 (SUTURE) ×3 IMPLANT
SUT VIC AB 2-0 CT1 27 (SUTURE) ×9
SUT VIC AB 2-0 CT1 TAPERPNT 27 (SUTURE) ×3 IMPLANT
SUT VLOC 180 0 24IN GS25 (SUTURE) ×3 IMPLANT
SYR 50ML LL SCALE MARK (SYRINGE) ×3 IMPLANT
TOWEL OR 17X26 10 PK STRL BLUE (TOWEL DISPOSABLE) ×3 IMPLANT
TOWEL OR NON WOVEN STRL DISP B (DISPOSABLE) IMPLANT
TRAY FOLEY CATH 14FRSI W/METER (CATHETERS) ×1 IMPLANT
TRAY FOLEY METER SIL LF 16FR (CATHETERS) ×2 IMPLANT
WATER STERILE IRR 1500ML POUR (IV SOLUTION) ×3 IMPLANT
WRAP KNEE MAXI GEL POST OP (GAUZE/BANDAGES/DRESSINGS) ×3 IMPLANT
YANKAUER SUCT BULB TIP 10FT TU (MISCELLANEOUS) ×3 IMPLANT

## 2014-10-06 NOTE — Anesthesia Preprocedure Evaluation (Addendum)
Anesthesia Evaluation  Patient identified by MRN, date of birth, ID band Patient awake    Reviewed: Allergy & Precautions, NPO status , Patient's Chart, lab work & pertinent test results  History of Anesthesia Complications (+) history of anesthetic complications  Airway Mallampati: II  TM Distance: >3 FB Neck ROM: Full    Dental no notable dental hx.    Pulmonary asthma ,  breath sounds clear to auscultation  Pulmonary exam normal       Cardiovascular + dysrhythmias Rhythm:Regular Rate:Normal  H/O RBBB   Neuro/Psych  Headaches, negative psych ROS   GI/Hepatic negative GI ROS, Neg liver ROS,   Endo/Other  negative endocrine ROS  Renal/GU negative Renal ROS  negative genitourinary   Musculoskeletal  (+) Arthritis -,   Abdominal   Peds negative pediatric ROS (+)  Hematology negative hematology ROS (+)   Anesthesia Other Findings   Reproductive/Obstetrics negative OB ROS                             Anesthesia Physical Anesthesia Plan  ASA: II  Anesthesia Plan: Spinal   Post-op Pain Management:    Induction: Intravenous  Airway Management Planned:   Additional Equipment:   Intra-op Plan:   Post-operative Plan:   Informed Consent: I have reviewed the patients History and Physical, chart, labs and discussed the procedure including the risks, benefits and alternatives for the proposed anesthesia with the patient or authorized representative who has indicated his/her understanding and acceptance.   Dental advisory given  Plan Discussed with: CRNA  Anesthesia Plan Comments: (Discussed risks and benefits of and differences between spinal and general. Discussed risks of spinal including headache, backache, failure, bleeding, infection, and nerve damage. Patient consents to spinal. Questions answered. Coagulation studies and platelet count acceptable.)       Anesthesia Quick  Evaluation

## 2014-10-06 NOTE — Anesthesia Procedure Notes (Signed)
Spinal Patient location during procedure: OR End time: 10/06/2014 9:24 AM Staffing Resident/CRNA: WILLIFORD, PEGGY D Performed by: anesthesiologist and resident/CRNA  Preanesthetic Checklist Completed: patient identified, site marked, surgical consent, pre-op evaluation, timeout performed, IV checked, risks and benefits discussed and monitors and equipment checked Spinal Block Patient position: sitting Prep: Betadine Patient monitoring: heart rate, continuous pulse ox and blood pressure Approach: midline Location: L3-4 Injection technique: single-shot Needle Needle type: Sprotte and Pencil-Tip  Needle gauge: 24 G Needle length: 9 cm Assessment Sensory level: T6 Additional Notes Expiration date of kit checked and confirmed. Patient tolerated procedure well, without complications.     

## 2014-10-06 NOTE — Anesthesia Postprocedure Evaluation (Signed)
  Anesthesia Post-op Note  Patient: Isabella Valencia  Procedure(s) Performed: Procedure(s) (LRB): RIGHT TOTAL KNEE ARTHROPLASTY (Right)  Patient Location: PACU  Anesthesia Type: Spinal  Level of Consciousness: awake and alert   Airway and Oxygen Therapy: Patient Spontanous Breathing  Post-op Pain: mild  Post-op Assessment: Post-op Vital signs reviewed, Patient's Cardiovascular Status Stable, Respiratory Function Stable, Patent Airway and No signs of Nausea or vomiting  Last Vitals:  Filed Vitals:   10/06/14 1200  BP:   Pulse:   Temp:   Resp: 16    Post-op Vital Signs: stable   Complications: No apparent anesthesia complications

## 2014-10-06 NOTE — Evaluation (Signed)
Physical Therapy Evaluation Patient Details Name: Isabella Valencia MRN: [id number] DOB: 04-Feb-1941 Today's Date: 10/06/2014   History of Present Illness  s/p RTKA  Clinical Impression  S/p RTKA presents with decreased ability with mobility. To benefit from PT to increase ROM, strength and ability to eventually safely transition back home.     Follow Up Recommendations SNF (seems pt already planned for SNF )    Equipment Recommendations  Rolling walker with 5" wheels    Recommendations for Other Services       Precautions / Restrictions Precautions Precautions: Knee Restrictions Weight Bearing Restrictions: No      Mobility  Bed Mobility Overal bed mobility: Needs Assistance Bed Mobility: Supine to Sit;Sit to Supine     Supine to sit: Min assist     General bed mobility comments: asssit with upper body and LE   Transfers Overall transfer level: Needs assistance Equipment used: Rolling walker (2 wheeled) Transfers: Sit to/from Stand Sit to Stand: Min assist         General transfer comment: assist for lift off and cues for RW safety and hand placement  Ambulation/Gait Ambulation/Gait assistance: Min assist Ambulation Distance (Feet): 5 Feet Assistive device: Rolling walker (2 wheeled) Gait Pattern/deviations: Step-to pattern     General Gait Details: limited due to pt vomited once sitting EOB and didn't feel like waliking a distance.   Stairs            Wheelchair Mobility    Modified Rankin (Stroke Patients Only)       Balance Overall balance assessment: No apparent balance deficits (not formally assessed)                                           Pertinent Vitals/Pain Pain Assessment: 0-10 Pain Score: 2  Pain Location: R knee Pain Descriptors / Indicators: Aching Pain Intervention(s): Limited activity within patient's tolerance;Monitored during session;Premedicated before session;Ice applied    Home Living  Family/patient expects to be discharged to:: Skilled nursing facility Living Arrangements: Spouse/significant other Available Help at Discharge: Family Type of Home: House Home Access: Stairs to enter Entrance Stairs-Rails: Right Entrance Stairs-Number of Steps: 3-5 Home Layout: Two level;Bed/bath upstairs;Able to live on main level with bedroom/bathroom Home Equipment: Cane - single point      Prior Function Level of Independence: Independent         Comments: just limited with distance and pain      Hand Dominance        Extremity/Trunk Assessment               Lower Extremity Assessment: RLE deficits/detail RLE Deficits / Details: grossly 0-60 knee flexionin supine and quad 3+/5       Communication   Communication: No difficulties  Cognition Arousal/Alertness: Awake/alert Behavior During Therapy: WFL for tasks assessed/performed Overall Cognitive Status: Within Functional Limits for tasks assessed                      General Comments      Exercises Total Joint Exercises Ankle Circles/Pumps: AROM;Supine;Right;5 reps Quad Sets: AROM;Supine;Right;5 reps Heel Slides: AROM;Supine;Right;5 reps Straight Leg Raises: AAROM;Supine;Right;5 reps      Assessment/Plan    PT Assessment Patient needs continued PT services  PT Diagnosis Difficulty walking   PT Problem List Decreased strength;Decreased range of motion;Decreased activity tolerance;Decreased knowledge of use of DME;Decreased  mobility  PT Treatment Interventions DME instruction;Gait training;Patient/family education;Functional mobility training;Therapeutic activities;Therapeutic exercise   PT Goals (Current goals can be found in the Care Plan section) Acute Rehab PT Goals Patient Stated Goal: to walk better again  PT Goal Formulation: With patient Time For Goal Achievement: 10/20/14 Potential to Achieve Goals: Good    Frequency 7X/week   Barriers to discharge        Co-evaluation                End of Session Equipment Utilized During Treatment: Gait belt Activity Tolerance: Patient tolerated treatment well Patient left: in chair;with call bell/phone within reach;with family/visitor present Nurse Communication: Mobility status         Time: 4174-0814 PT Time Calculation (min) (ACUTE ONLY): 24 min   Charges:   PT Evaluation $Initial PT Evaluation Tier I: 1 Procedure PT Treatments $Therapeutic Exercise: 8-22 mins   PT G CodesClide Dales 2014/11/04, 6:33 PM Clide Dales, PT Pager: [phone] 11-04-2014

## 2014-10-06 NOTE — Op Note (Signed)
NAME:  Isabella Valencia                      MEDICAL RECORD NO.:  [id number]                             FACILITY:  [facility]      PHYSICIAN:  Pietro Cassis. Alvan Dame, M.D.  DATE OF BIRTH:  Nov 12, 1940      DATE OF PROCEDURE:  10/06/2014                                     OPERATIVE REPORT         PREOPERATIVE DIAGNOSIS:  Right knee osteoarthritis.      POSTOPERATIVE DIAGNOSIS:  Right knee osteoarthritis.      FINDINGS:  The patient was noted to have complete loss of cartilage and   bone-on-bone arthritis with associated osteophytes in the medial and patellofemoral compartments of   the knee with a significant synovitis and associated effusion.      PROCEDURE:  Right total knee replacement.      COMPONENTS USED:  De[employer] Sigma rotating platform posterior stabilized knee   system, a size 4N femur, 3 tibia, 10 mm PS insert, and 35 patellar   button.      SURGEON:  Pietro Cassis. Alvan Dame, M.D.      ASSISTANT:  Danae Orleans, PA-C.      ANESTHESIA:  Spinal.      SPECIMENS:  None.      COMPLICATION:  None.      DRAINS:  none.  EBL: <50cc      TOURNIQUET TIME:   Total Tourniquet Time Documented: Thigh (Right) - 30 minutes Total: Thigh (Right) - 30 minutes  .      The patient was stable to the recovery room.      INDICATION FOR PROCEDURE:  Avia E Zayed is a 74 y.o. female patient of   mine.  The patient had been seen, evaluated, and treated conservatively in the   office with medication, activity modification, and injections.  The patient had   radiographic changes of bone-on-bone arthritis with endplate sclerosis and osteophytes noted.      The patient failed conservative measures including medication, injections, and activity modification, and at this point was ready for more definitive measures.   Based on the radiographic changes and failed conservative measures, the patient   decided to proceed with total knee replacement.  Risks of infection,   DVT, component failure, need for revision  surgery, postop course, and   expectations were all   discussed and reviewed.  Consent was obtained for benefit of pain   relief.      PROCEDURE IN DETAIL:  The patient was brought to the operative theater.   Once adequate anesthesia, preoperative antibiotics, 2 gm of Ancef, 1gm of Tranexamic Acid, 10mg  of Decadron administered, the patient was positioned supine with the right thigh tourniquet placed.  The  right lower extremity was prepped and draped in sterile fashion.  A time-   out was performed identifying the patient, planned procedure, and   extremity.      The right lower extremity was placed in the [facility] leg holder.  The leg was   exsanguinated, tourniquet elevated to 250 mmHg.  A midline incision was   made followed by median parapatellar arthrotomy.  Following initial   exposure, attention was first directed to the patella.  Precut   measurement was noted to be 19 mm.  I resected down to 14 mm and used a   35 patellar button to restore patellar height as well as cover the cut   surface.      The lug holes were drilled and a metal shim was placed to protect the   patella from retractors and saw blades.      At this point, attention was now directed to the femur.  The femoral   canal was opened with a drill, irrigated to try to prevent fat emboli.  An   intramedullary rod was passed at 3 degrees valgus, 10 mm of bone was   resected off the distal femur.  Following this resection, the tibia was   subluxated anteriorly.  Using the extramedullary guide, 2 mm of bone was resected off   the proximal medial tibia.  We confirmed the gap would be   stable medially and laterally with a 10 mm insert as well as confirmed   the cut was perpendicular in the coronal plane, checking with an alignment rod.      Once this was done, I sized the femur to be a size 4 in the anterior-   posterior dimension, chose a narrow component based on medial and   lateral dimension.  The size 4 rotation  block was then pinned in   position anterior referenced using the C-clamp to set rotation.  The   anterior, posterior, and  chamfer cuts were made without difficulty nor   notching making certain that I was along the anterior cortex to help   with flexion gap stability.      The final box cut was made off the lateral aspect of distal femur.      At this point, the tibia was sized to be a size 3, the size 3 tray was   then pinned in position through the medial third of the tubercle,   drilled, and keel punched.  Trial reduction was now carried with a 4N femur,  3 tibia, a 10 mm PS insert, and the 35 patella botton.  The knee was brought to   extension, full extension with good flexion stability with the patella   tracking through the trochlea without application of pressure.  Given   all these findings, the trial components removed.  Final components were   opened and cement was mixed.  The knee was irrigated with normal saline   solution and pulse lavage.  The synovial lining was   then injected with 30cc of 0.25% Marcaine with epinephrine and 1 cc of Toradol plus 30cc of NS for a  total of 61 cc.      The knee was irrigated.  Final implants were then cemented onto clean and   dried cut surfaces of bone with the knee brought to extension with a 10 mm trial insert.      Once the cement had fully cured, the excess cement was removed   throughout the knee.  I confirmed I was satisfied with the range of   motion and stability, and the final 10 mm PS insert was chosen.  It was   placed into the knee.      The tourniquet had been let down at 30 minutes.  No significant   hemostasis required.  The   extensor mechanism was then reapproximated using #1 Vicryl with the knee  in flexion.  The   remaining wound was closed with 2-0 Vicryl and running 4-0 Monocryl.   The knee was cleaned, dried, dressed sterilely using Dermabond and   Aquacel dressing.  The patient was then   brought to  recovery room in stable condition, tolerating the procedure   well.   Please note that Physician Assistant, Danae Orleans, PA-C, was present for the entirety of the case, and was utilized for pre-operative positioning, peri-operative retractor management, general facilitation of the procedure.  He was also utilized for primary wound closure at the end of the case.              Pietro Cassis Alvan Dame, M.D.    10/06/2014 10:52 AM

## 2014-10-06 NOTE — Progress Notes (Signed)
Clinical Social Work Department CLINICAL SOCIAL WORK PLACEMENT NOTE 10/06/2014  Patient:  Isabella Valencia, Isabella Valencia  Account Number:  [id number] Admit date:  10/06/2014  Clinical Social Worker:  Werner Lean, LCSW  Date/time:  10/06/2014 04:20 PM  Clinical Social Work is seeking post-discharge placement for this patient at the following level of care:   SKILLED NURSING   (*CSW will update this form in Epic as items are completed)     Patient/family provided with [facility] Department of Clinical Social Work's list of facilities offering this level of care within the geographic area requested by the patient (or if unable, by the patient's family).  10/06/2014  Patient/family informed of their freedom to choose among providers that offer the needed level of care, that participate in Medicare, Medicaid or managed care program needed by the patient, have an available bed and are willing to accept the patient.    Patient/family informed of [facility]' ownership interest in [facility], as well as of the fact that they are under no obligation to receive care at this facility.  PASARR submitted to EDS on 10/06/2014 PASARR number received on 10/06/2014  FL2 transmitted to all facilities in geographic area requested by pt/family on  10/06/2014 FL2 transmitted to all facilities within larger geographic area on   Patient informed that his/her managed care company has contracts with or will negotiate with  certain facilities, including the following:     Patient/family informed of bed offers received:  10/06/2014 Patient chooses bed at [place] Physician recommends and patient chooses bed at    Patient to be transferred to  on   Patient to be transferred to facility by  Patient and family notified of transfer on  Name of family member notified:    The following physician request were entered in Epic:   Additional Comments:  Werner Lean LCSW [phone]

## 2014-10-06 NOTE — Transfer of Care (Signed)
Immediate Anesthesia Transfer of Care Note  Patient: Isabella Valencia  Procedure(s) Performed: Procedure(s): RIGHT TOTAL KNEE ARTHROPLASTY (Right)  Patient Location: PACU  Anesthesia Type:Spinal  Level of Consciousness: awake, alert  and oriented  Airway & Oxygen Therapy: Patient Spontanous Breathing and Patient connected to face mask oxygen  Post-op Assessment: Report given to RN and Post -op Vital signs reviewed and stable  Post vital signs: Reviewed and stable  Last Vitals:  Filed Vitals:   10/06/14 0659  BP: 97/81  Pulse: 94  Temp: 37.1 C  Resp: 16    Complications: No apparent anesthesia complications

## 2014-10-06 NOTE — Progress Notes (Signed)
Utilization review completed.  

## 2014-10-06 NOTE — Progress Notes (Signed)
Clinical Social Work Department BRIEF PSYCHOSOCIAL ASSESSMENT 10/06/2014  Patient:  Isabella Valencia, Isabella Valencia     Account Number:  [id number]     Admit date:  10/06/2014  Clinical Social Worker:  Lacie Scotts  Date/Time:  10/06/2014 03:18 PM  Referred by:  Physician  Date Referred:  10/06/2014 Referred for  SNF Placement   Other Referral:   Interview type:  Patient Other interview type:    PSYCHOSOCIAL DATA Living Status:  ALONE Admitted from facility:   Level of care:   Primary support name:  Angelica Chessman Primary support relationship to patient:  CHILD, ADULT Degree of support available:   supportive    CURRENT CONCERNS Current Concerns  Post-Acute Placement   Other Concerns:    SOCIAL WORK ASSESSMENT / PLAN Pt is a 74 yr old female living at home prior to hospitalization. CSW met with pt / family to assist with d/c planning. This is a planned admission. Pt has made prior arrangements to have ST Rehab at [facility] following hospital d/c. CSW has contacted SNF and d/c plans have been confirmed. CSW will continue to follow to assist with d/c planning to SNF.   Assessment/plan status:  Psychosocial Support/Ongoing Assessment of Needs Other assessment/ plan:   Information/referral to community resources:   Insurance coverage for SNF and ambulance transport reviewed.    PATIENT'S/FAMILY'S RESPONSE TO PLAN OF CARE: " I'm going to [facility] for rehab. They're expecting me. " Pt's mood is bright. Pt / family are relieved surgery is completed and all went well.  " I know I'm going to be sore."  Reassurance / encouragement provided.    Werner Lean  LCSW [phone]

## 2014-10-06 NOTE — Interval H&P Note (Signed)
History and Physical Interval Note:  10/06/2014 8:28 AM  Isabella Valencia  has presented today for surgery, with the diagnosis of RIGHT KNEE OA  The various methods of treatment have been discussed with the patient and family. After consideration of risks, benefits and other options for treatment, the patient has consented to  Procedure(s): RIGHT TOTAL KNEE ARTHROPLASTY (Right) as a surgical intervention .  The patient's history has been reviewed, patient examined, no change in status, stable for surgery.  I have reviewed the patient's chart and labs.  Questions were answered to the patient's satisfaction.     Mauri Pole

## 2014-10-07 ENCOUNTER — Encounter (HOSPITAL_COMMUNITY): Payer: Self-pay | Admitting: Orthopedic Surgery

## 2014-10-07 LAB — BASIC METABOLIC PANEL
ANION GAP: 6 (ref 5–15)
BUN: 14 mg/dL (ref 6–23)
CALCIUM: 8.6 mg/dL (ref 8.4–10.5)
CHLORIDE: 105 mmol/L (ref 96–112)
CO2: 26 mmol/L (ref 19–32)
Creatinine, Ser: 0.62 mg/dL (ref 0.50–1.10)
GFR calc Af Amer: >90 mL/min (ref >90)
GFR, EST NON AFRICAN AMERICAN: 87 mL/min — AB (ref >90)
Glucose, Bld: 135 mg/dL — ABNORMAL HIGH (ref 70–99)
POTASSIUM: 4 mmol/L (ref 3.5–5.1)
Sodium: 137 mmol/L (ref 135–145)

## 2014-10-07 LAB — CBC
HCT: 33.5 % — ABNORMAL LOW (ref 36.0–46.0)
Hemoglobin: 10.9 g/dL — ABNORMAL LOW (ref 12.0–15.0)
MCH: 32.3 pg (ref 26.0–34.0)
MCHC: 32.5 g/dL (ref 30.0–36.0)
MCV: 99.4 fL (ref 78.0–100.0)
PLATELETS: 232 10*3/uL (ref 150–400)
RBC: 3.37 MIL/uL — AB (ref 3.87–5.11)
RDW: 12.7 % (ref 11.5–15.5)
WBC: 10.9 10*3/uL — AB (ref 4.0–10.5)

## 2014-10-07 NOTE — Progress Notes (Signed)
OT Cancellation Note  Patient Details Name: Isabella Valencia MRN: [id number] DOB: Jul 08, 1940   Cancelled Treatment:  Noted plans for SNF- Will defer OT eval to SNF [facility] [place], Thereasa Parkin 10/07/2014, 12:39 PM

## 2014-10-07 NOTE — Progress Notes (Signed)
Physical Therapy Treatment Patient Details Name: Isabella Valencia MRN: [id number] DOB: 28-Jun-1941 Today's Date: 10/07/2014    History of Present Illness s/p RTKA    PT Comments    POD # 1 am session.  Assisted with amb a limited distance in hallway then performed some TKR TE's followed by ICE.   Follow Up Recommendations  SNF     Equipment Recommendations  Rolling walker with 5" wheels    Recommendations for Other Services       Precautions / Restrictions      Mobility  Bed Mobility               General bed mobility comments: Pt OOB in recliner  Transfers Overall transfer level: Needs assistance Equipment used: Rolling walker (2 wheeled) Transfers: Sit to/from Stand Sit to Stand: Min assist            Ambulation/Gait Ambulation/Gait assistance: Min assist Ambulation Distance (Feet): 12 Feet Assistive device: Rolling walker (2 wheeled) Gait Pattern/deviations: Step-to pattern;Decreased stance time - right Gait velocity: decreased   General Gait Details: 75% VC's to increase WB thru R LE and proper placement.   Stairs            Wheelchair Mobility    Modified Rankin (Stroke Patients Only)       Balance                                    Cognition Arousal/Alertness: Awake/alert Behavior During Therapy: WFL for tasks assessed/performed Overall Cognitive Status: Within Functional Limits for tasks assessed                      Exercises   Total Knee Replacement TE's 10 reps B LE ankle pumps 10 reps towel squeezes 10 reps knee presses  Followed by ICE     General Comments        Pertinent Vitals/Pain Pain Assessment: 0-10 Pain Score: 5  Pain Location: R knee Pain Descriptors / Indicators: Aching;Sore Pain Intervention(s): Monitored during session;Premedicated before session;Repositioned;Ice applied    Home Living                      Prior Function            PT Goals (current goals can  now be found in the care plan section) Progress towards PT goals: Progressing toward goals    Frequency  7X/week    PT Plan      Co-evaluation             End of Session Equipment Utilized During Treatment: Gait belt Activity Tolerance: Patient limited by fatigue Patient left: in chair;with call bell/phone within reach     Time: 1055-1120 PT Time Calculation (min) (ACUTE ONLY): 25 min  Charges:  $Gait Training: 8-22 mins $Therapeutic Exercise: 8-22 mins                    G Codes:      Rica Koyanagi  PTA [facility]  Acute  Rehab Pager      [phone]

## 2014-10-07 NOTE — Care Management Note (Signed)
    Page 1 of 1   10/07/2014     11:51:50 AM CARE MANAGEMENT NOTE 10/07/2014  Patient:  Isabella Valencia, Isabella Valencia   Account Number:  [id number]  Date Initiated:  10/07/2014  Documentation initiated by:  [facility]  Subjective/Objective Assessment:   adm: RIGHT TOTAL KNEE ARTHROPLASTY (Right)     Action/Plan:   discharge planning   Anticipated DC Date:  10/08/2014   Anticipated DC Plan:  [place]  CM consult      Choice offered to / List presented to:             Status of service:  Completed, signed off Medicare Important Message given?   (If response is "NO", the following Medicare IM given date fields will be blank) Date Medicare IM given:   Medicare IM given by:   Date Additional Medicare IM given:   Additional Medicare IM given by:    Discharge Disposition:  [place]  Per UR Regulation:    If discussed at Long Length of Stay Meetings, dates discussed:    Comments:  10/07/14 11:50 CM notes pt to go to SNF; CSW arranging.  No other CM needs were communicated.  Mariane Masters, BSN, CM [phone].

## 2014-10-07 NOTE — Progress Notes (Signed)
     Subjective: 1 Day Post-Op Procedure(s) (LRB): RIGHT TOTAL KNEE ARTHROPLASTY (Right)   Patient reports pain as mild, pain controlled. Some nausea yesterday, but otherwise no events.   Objective:   VITALS:   Filed Vitals:   10/07/14 0640  BP: 102/60  Pulse: 58  Temp: 97.6 F (36.4 C)  Resp: 16    Dorsiflexion/Plantar flexion intact Incision: dressing C/D/I No cellulitis present Compartment soft  LABS  Recent Labs  10/07/14 0428  HGB 10.9*  HCT 33.5*  WBC 10.9*  PLT 232     Recent Labs  10/07/14 0428  NA 137  K 4.0  BUN 14  CREATININE 0.62  GLUCOSE 135*     Assessment/Plan: 1 Day Post-Op Procedure(s) (LRB): RIGHT TOTAL KNEE ARTHROPLASTY (Right) Foley cath d/c'ed Advance diet Up with therapy D/C IV fluids Discharge to SNF [facility]) eventually, when ready  Overweight (BMI 25-29.9) Estimated body mass index is 27.97 kg/(m^2) as calculated from the following:   Height as of this encounter: 5\' 4"  (1.626 m).   Weight as of this encounter: 73.936 kg (163 lb). Patient also counseled that weight may inhibit the healing process Patient counseled that losing weight will help with future health issues      [place]. Raynelle Fujikawa   PAC  10/07/2014, 9:53 AM

## 2014-10-08 DIAGNOSIS — E663 Overweight: Secondary | ICD-10-CM | POA: Diagnosis present

## 2014-10-08 LAB — CBC
HCT: 32.8 % — ABNORMAL LOW (ref 36.0–46.0)
HEMOGLOBIN: 10.7 g/dL — AB (ref 12.0–15.0)
MCH: 32.5 pg (ref 26.0–34.0)
MCHC: 32.6 g/dL (ref 30.0–36.0)
MCV: 99.7 fL (ref 78.0–100.0)
PLATELETS: 240 10*3/uL (ref 150–400)
RBC: 3.29 MIL/uL — AB (ref 3.87–5.11)
RDW: 13.1 % (ref 11.5–15.5)
WBC: 7.8 10*3/uL (ref 4.0–10.5)

## 2014-10-08 LAB — BASIC METABOLIC PANEL
ANION GAP: 6 (ref 5–15)
BUN: 20 mg/dL (ref 6–23)
CO2: 27 mmol/L (ref 19–32)
Calcium: 8.9 mg/dL (ref 8.4–10.5)
Chloride: 105 mmol/L (ref 96–112)
Creatinine, Ser: 0.74 mg/dL (ref 0.50–1.10)
GFR calc Af Amer: >90 mL/min (ref >90)
GFR, EST NON AFRICAN AMERICAN: 82 mL/min — AB (ref >90)
GLUCOSE: 104 mg/dL — AB (ref 70–99)
POTASSIUM: 4.1 mmol/L (ref 3.5–5.1)
SODIUM: 138 mmol/L (ref 135–145)

## 2014-10-08 MED ORDER — DOCUSATE SODIUM 100 MG PO CAPS: 100.0000 mg | ORAL_CAPSULE | Freq: Two times a day (BID) | ORAL | Status: DC

## 2014-10-08 MED ORDER — ASPIRIN 325 MG PO TBEC: 325.0000 mg | DELAYED_RELEASE_TABLET | Freq: Two times a day (BID) | ORAL | Status: AC

## 2014-10-08 MED ORDER — TIZANIDINE HCL 4 MG PO TABS: 4.0000 mg | ORAL_TABLET | Freq: Four times a day (QID) | ORAL | Status: DC | PRN

## 2014-10-08 MED ORDER — FERROUS SULFATE 325 (65 FE) MG PO TABS: 325.0000 mg | ORAL_TABLET | Freq: Three times a day (TID) | ORAL | Status: DC

## 2014-10-08 MED ORDER — POLYETHYLENE GLYCOL 3350 17 G PO PACK: 17.0000 g | PACK | Freq: Two times a day (BID) | ORAL | Status: DC

## 2014-10-08 MED ORDER — HYDROCODONE-ACETAMINOPHEN 7.5-325 MG PO TABS: 1.0000 | ORAL_TABLET | ORAL | Status: DC | PRN

## 2014-10-08 MED ORDER — PROMETHAZINE HCL 12.5 MG PO TABS: 12.5000 mg | ORAL_TABLET | Freq: Four times a day (QID) | ORAL | Status: DC | PRN

## 2014-10-08 NOTE — Discharge Summary (Signed)
Physician Discharge Summary  Patient ID: Isabella Valencia MRN: [id number] DOB/AGE: 1941/01/13 74 y.o.  Admit date: 10/06/2014 Discharge date:   10/09/2014  Procedures:  Procedure(s) (LRB): RIGHT TOTAL KNEE ARTHROPLASTY (Right)  Attending Physician:  Dr. Paralee Cancel   Admission Diagnoses:   Right knee primary OA / pain  Discharge Diagnoses:  Principal Problem:   S/P right TKA Active Problems:   S/P knee replacement   Overweight (BMI 25.0-29.9)  Past Medical History  Diagnosis Date  . Arthritis 2004    neg ANA & RA  . Hyperlipidemia   . MVP (mitral valve prolapse)   . Complication of anesthesia     slow to wake up  . Asthma     minimal problems  . History of skin cancer   . Difficulty sleeping     due to knee pain    HPI:    Isabella Valencia, 74 y.o. female, has a history of pain and functional disability in the right knee due to arthritis and has failed non-surgical conservative treatments for greater than 12 weeks to includeNSAID's and/or analgesics, corticosteriod injections and activity modification. Onset of symptoms was gradual, starting 5+ years ago with gradually worsening course since that time. The patient noted prior procedures on the knee to include arthroscopy on the right knee(s). Patient currently rates pain in the right knee(s) at 8 out of 10 with activity. Patient has night pain, worsening of pain with activity and weight bearing, pain that interferes with activities of daily living, pain with passive range of motion, crepitus and joint swelling. Patient has evidence of periarticular osteophytes and joint space narrowing by imaging studies. There is no active infection. Risks, benefits and expectations were discussed with the patient. Risks including but not limited to the risk of anesthesia, blood clots, nerve damage, blood vessel damage, failure of the prosthesis, infection and up to and including death. Patient understand the risks, benefits and expectations  and wishes to proceed with surgery.   PCP: Unice Cobble, MD   Discharged Condition: good  Hospital Course:  Patient underwent the above stated procedure on 10/06/2014. Patient tolerated the procedure well and brought to the recovery room in good condition and subsequently to the floor.  POD #1 BP: 102/60 ; Pulse: 58 ; Temp: 97.6 F (36.4 C) ; Resp: 16 Patient reports pain as mild, pain controlled. Some nausea yesterday, but otherwise no events.  Dorsiflexion/plantar flexion intact, incision: dressing C/D/I, no cellulitis present and compartment soft.   LABS  Basename    HGB  10.9  HCT  33.5   POD #2  BP: 125/67 ; Pulse: 87 ; Temp: 97.8 F (36.6 C) ; Resp: 16 Patient reports pain as mild, pain controlled. No events throughout the night. Complaining of some nausea. Ready to be discharged. Dorsiflexion/plantar flexion intact, incision: dressing C/D/I, no cellulitis present and compartment soft.   LABS  Basename    HGB  10.7  HCT  32.8   POD #3  BP: 137/82 ; Pulse: 91 ; Temp: 97.7 F (36.5 C) ; Resp: 16 Patient reports pain as mild, pain controlled. Still complaining of nausea, but otherwise no events. Ready to be discharged to skilled nursing facility. Dorsiflexion/plantar flexion intact, incision: dressing C/D/I, no cellulitis present and compartment soft.   LABS   No new labs   Discharge Exam: General appearance: alert, cooperative and no distress Extremities: Homans sign is negative, no sign of DVT, no edema, redness or tenderness in the calves or thighs and  no ulcers, gangrene or trophic changes  Disposition:  Skilled nursing facility with follow up in 2 weeks   Follow-up Information    Follow up with Mauri Pole, MD. Schedule an appointment as soon as possible for a visit in 2 weeks.   Specialty:  Orthopedic Surgery   Contact information:   [address] [place] [address] [phone]       Discharge Instructions    Call MD / Call  911    Complete by:  As directed   If you experience chest pain or shortness of breath, CALL 911 and be transported to the hospital emergency room.  If you develope a fever above 101 F, pus (white drainage) or increased drainage or redness at the wound, or calf pain, call your surgeon's office.     Change dressing    Complete by:  As directed   Maintain surgical dressing until follow up in the clinic. If the edges start to pull up, may reinforce with tape. If the dressing is no longer working, may remove and cover with gauze and tape, but must keep the area dry and clean.  Call with any questions or concerns.     Constipation Prevention    Complete by:  As directed   Drink plenty of fluids.  Prune juice may be helpful.  You may use a stool softener, such as Colace (over the counter) 100 mg twice a day.  Use MiraLax (over the counter) for constipation as needed.     Diet - low sodium heart healthy    Complete by:  As directed      Discharge instructions    Complete by:  As directed   Maintain surgical dressing until follow up in the clinic. If the edges start to pull up, may reinforce with tape. If the dressing is no longer working, may remove and cover with gauze and tape, but must keep the area dry and clean.  Follow up in 2 weeks at [facility]. Call with any questions or concerns.     Increase activity slowly as tolerated    Complete by:  As directed      TED hose    Complete by:  As directed   Use stockings (TED hose) for 2 weeks on both leg(s).  You may remove them at night for sleeping.     Weight bearing as tolerated    Complete by:  As directed   Laterality:  right  Extremity:  Lower             Medication List    STOP taking these medications        naproxen sodium 220 MG tablet  Commonly known as:  ANAPROX     traMADol 50 MG tablet  Commonly known as:  ULTRAM      TAKE these medications        aspirin 325 MG EC tablet  Take 1 tablet (325 mg total) by  mouth 2 (two) times daily.     docusate sodium 100 MG capsule  Commonly known as:  COLACE  Take 1 capsule (100 mg total) by mouth 2 (two) times daily.     ELESTRIN TD  Place 1 application onto the skin every morning.     ferrous sulfate 325 (65 FE) MG tablet  Take 1 tablet (325 mg total) by mouth 3 (three) times daily after meals.     HYDROcodone-acetaminophen 7.5-325 MG per tablet  Commonly known as:  NORCO  Take 1-2 tablets by mouth every 4 (four) hours as needed for moderate pain.     ONE-A-DAY ESSENTIAL Tabs  Take 1 tablet by mouth every morning.     polyethylene glycol packet  Commonly known as:  MIRALAX / GLYCOLAX  Take 17 g by mouth 2 (two) times daily.     promethazine 12.5 MG tablet  Commonly known as:  PHENERGAN  Take 1 tablet (12.5 mg total) by mouth every 6 (six) hours as needed for nausea or vomiting.     tiZANidine 4 MG tablet  Commonly known as:  ZANAFLEX  Take 1 tablet (4 mg total) by mouth every 6 (six) hours as needed for muscle spasms.         Signed:   West Pugh. Lindberg Zenon   PA-C  10/09/2014, 7:57 AM

## 2014-10-08 NOTE — Discharge Instructions (Signed)

## 2014-10-08 NOTE — Progress Notes (Signed)
Physical Therapy Treatment Patient Details Name: Isabella Valencia MRN: [id number] DOB: 10-07-1940 Today's Date: 10/08/2014    History of Present Illness s/p RTKA    PT Comments    POD # 2 pm session.  Pt feeling a little better but still present with c/o fatigue and pain which limits her activity tolerance.  Pt will need ST Rehab at SNF prior to returning home.  Follow Up Recommendations  SNF     Equipment Recommendations  Rolling walker with 5" wheels    Recommendations for Other Services       Precautions / Restrictions Precautions Precautions: Knee;Fall Restrictions Weight Bearing Restrictions: No    Mobility  Bed Mobility Overal bed mobility: Needs Assistance Bed Mobility: Sit to Supine     Supine to sit: Min guard (Assist with LE) Sit to supine: Min guard   General bed mobility comments: assist for R LE only  Transfers Overall transfer level: Needs assistance Equipment used: Rolling walker (2 wheeled) Transfers: Sit to/from Stand Sit to Stand: Min guard         General transfer comment: increased time and <25% VC's on safety with turns and hand placement with stand to sit   Ambulation/Gait Ambulation/Gait assistance: Min guard Ambulation Distance (Feet): 65 Feet Assistive device: Rolling walker (2 wheeled) Gait Pattern/deviations: Step-to pattern;Step-through pattern;Decreased stance time - right Gait velocity: decreased   General Gait Details: 25% VC's to increase WB thru R LE and proper placement.  Increased time.  Feeling a little better.   Stairs            Wheelchair Mobility    Modified Rankin (Stroke Patients Only)       Balance                                    Cognition Arousal/Alertness: Awake/alert Behavior During Therapy: WFL for tasks assessed/performed Overall Cognitive Status: Within Functional Limits for tasks assessed                      Exercises Total Joint Exercises Ankle Circles/Pumps:  AROM;10 reps;Right;Seated Quad Sets: AROM;Right;10 reps;Seated Towel Squeeze: AROM;Seated;10 reps;Both Heel Slides: AAROM;Right;Seated;Other reps (comment) (7 reps) Hip ABduction/ADduction: AROM;Right;10 reps;Seated Straight Leg Raises: Right;Seated;AROM (1 rep unable to lift. )    General Comments        Pertinent Vitals/Pain Pain Assessment: 0-10 Pain Score: 4  Pain Location: R knee during gait Pain Descriptors / Indicators: Aching;Sore Pain Intervention(s): Repositioned    Home Living                      Prior Function            PT Goals (current goals can now be found in the care plan section) Progress towards PT goals: Progressing toward goals    Frequency  7X/week    PT Plan      Co-evaluation             End of Session Equipment Utilized During Treatment: Gait belt Activity Tolerance: Patient limited by fatigue;Patient limited by pain Patient left: in bed;with call bell/phone within reach     Time: 1422-1435 PT Time Calculation (min) (ACUTE ONLY): 13 min  Charges:  $Gait Training: 8-22 mins                    G Codes:      Cecille Rubin  Duvan Mousel  PTA [facility]  Acute  Rehab Pager      [phone]

## 2014-10-08 NOTE — Progress Notes (Signed)
Physical Therapy Treatment Patient Details Name: Isabella Valencia MRN: [id number] DOB: June 07, 1941 Today's Date: 10/08/2014    History of Present Illness  R TKA     PT Comments    POD #2 AM session; pt in bed; ambulate 56ft; pt performed exercises in chair;  pt was fatigued, c/o nausea and dizziness earlier this morning, limited by pain during ambulate and exercies;   Follow Up Recommendations  SNF     Equipment Recommendations  Rolling walker with 5" wheels    Recommendations for Other Services       Precautions / Restrictions Precautions Precautions: Knee;Fall Restrictions Weight Bearing Restrictions: No    Mobility  Bed Mobility   Bed Mobility: Supine to Sit     Supine to sit: Min guard (Assist with LE)     General bed mobility comments: PT OOB to ambulate with walker  Transfers Overall transfer level: Needs assistance Equipment used: Rolling walker (2 wheeled) Transfers: Sit to/from Stand Sit to Stand: Min guard         General transfer comment: assit with Lift   Ambulation/Gait Ambulation/Gait assistance: Min guard Ambulation Distance (Feet): 75 Feet Assistive device: Rolling walker (2 wheeled) Gait Pattern/deviations: Decreased stance time - right;Step-to pattern (tight grasp on walker; elevated shoulder )     General Gait Details: 75% VC's to increase WB thru R LE and proper placement.    Stairs            Wheelchair Mobility    Modified Rankin (Stroke Patients Only)       Balance                                    Cognition Arousal/Alertness: Awake/alert (Sleepy/Gorggy ) Behavior During Therapy: WFL for tasks assessed/performed Overall Cognitive Status: Within Functional Limits for tasks assessed                      Exercises Total Joint Exercises Ankle Circles/Pumps: AROM;10 reps;Right;Seated Quad Sets: AROM;Right;10 reps;Seated Towel Squeeze: AROM;Seated;10 reps;Both Heel Slides:  AAROM;Right;Seated;Other reps (comment) (7 reps) Hip ABduction/ADduction: AROM;Right;10 reps;Seated Straight Leg Raises: Right;Seated;AROM (1 rep unable to lift. )    General Comments        Pertinent Vitals/Pain Pain Assessment: 0-10 Pain Score: 6  Pain Location: R Knee; pain of 4 with ambulation and 6 with exercises  Pain Intervention(s): Premedicated before session;Repositioned;Ice applied;Monitored during session    Home Living                      Prior Function            PT Goals (current goals can now be found in the care plan section) Progress towards PT goals: Progressing toward goals    Frequency  7X/week    PT Plan      Co-evaluation             End of Session Equipment Utilized During Treatment: Gait belt Activity Tolerance: Patient limited by fatigue;Patient limited by pain Patient left: in chair;with call bell/phone within reach     Time: 1130-1155 PT Time Calculation (min) (ACUTE ONLY): 25 min  Charges:  $Gait Training: 8-22 mins $Therapeutic Exercise: 8-22 mins                    G Codes:      Duric,Sreto Student PTA 10/08/2014, 12:32 PM  Reviewed above  Rica Koyanagi  PTA [facility]  Acute  Rehab Pager      [phone]

## 2014-10-08 NOTE — Progress Notes (Signed)
     Subjective: 2 Days Post-Op Procedure(s) (LRB): RIGHT TOTAL KNEE ARTHROPLASTY (Right)   Patient reports pain as mild, pain controlled. No events throughout the night. Complaining of some nausea.  Ready to be discharged if nausea resolves and she continues to do well.  Objective:   VITALS:   Filed Vitals:   10/08/14 0454  BP: 125/67  Pulse: 87  Temp: 97.8 F (36.6 C)  Resp: 16    Dorsiflexion/Plantar flexion intact Incision: dressing C/D/I No cellulitis present Compartment soft  LABS  Recent Labs  10/07/14 0428 10/08/14 0435  HGB 10.9* 10.7*  HCT 33.5* 32.8*  WBC 10.9* 7.8  PLT 232 240     Recent Labs  10/07/14 0428 10/08/14 0435  NA 137 138  K 4.0 4.1  BUN 14 20  CREATININE 0.62 0.74  GLUCOSE 135* 104*     Assessment/Plan: 2 Days Post-Op Procedure(s) (LRB): RIGHT TOTAL KNEE ARTHROPLASTY (Right) Up with therapy Discharge to SNF  Follow up in 2 weeks at [facility] Follow up with OLIN,Sopheap Basic D in 2 weeks.  Contact information:  [facility] [address], [address] [name]   PAC  10/08/2014, 8:04 AM

## 2014-10-09 ENCOUNTER — Telehealth: Payer: Self-pay | Admitting: *Deleted

## 2014-10-09 DIAGNOSIS — Z96651 Presence of right artificial knee joint: Secondary | ICD-10-CM | POA: Diagnosis not present

## 2014-10-09 DIAGNOSIS — Z471 Aftercare following joint replacement surgery: Secondary | ICD-10-CM | POA: Diagnosis not present

## 2014-10-09 DIAGNOSIS — M6281 Muscle weakness (generalized): Secondary | ICD-10-CM | POA: Diagnosis not present

## 2014-10-09 DIAGNOSIS — M1711 Unilateral primary osteoarthritis, right knee: Secondary | ICD-10-CM | POA: Diagnosis not present

## 2014-10-09 DIAGNOSIS — J4520 Mild intermittent asthma, uncomplicated: Secondary | ICD-10-CM | POA: Diagnosis not present

## 2014-10-09 DIAGNOSIS — I4510 Unspecified right bundle-branch block: Secondary | ICD-10-CM | POA: Diagnosis not present

## 2014-10-09 DIAGNOSIS — R2681 Unsteadiness on feet: Secondary | ICD-10-CM | POA: Diagnosis not present

## 2014-10-09 DIAGNOSIS — R195 Other fecal abnormalities: Secondary | ICD-10-CM | POA: Diagnosis not present

## 2014-10-09 DIAGNOSIS — Z79890 Hormone replacement therapy (postmenopausal): Secondary | ICD-10-CM | POA: Diagnosis not present

## 2014-10-09 DIAGNOSIS — E785 Hyperlipidemia, unspecified: Secondary | ICD-10-CM | POA: Diagnosis not present

## 2014-10-09 DIAGNOSIS — I349 Nonrheumatic mitral valve disorder, unspecified: Secondary | ICD-10-CM | POA: Diagnosis not present

## 2014-10-09 DIAGNOSIS — R278 Other lack of coordination: Secondary | ICD-10-CM | POA: Diagnosis not present

## 2014-10-09 DIAGNOSIS — K5900 Constipation, unspecified: Secondary | ICD-10-CM | POA: Diagnosis not present

## 2014-10-09 DIAGNOSIS — D62 Acute posthemorrhagic anemia: Secondary | ICD-10-CM | POA: Diagnosis not present

## 2014-10-09 MED ORDER — PROMETHAZINE HCL 25 MG/ML IJ SOLN
6.2500 mg | Freq: Once | INTRAMUSCULAR | Status: AC
  Administered 2014-10-09: 12.5 mg via INTRAVENOUS
  Filled 2014-10-09: qty 1

## 2014-10-09 NOTE — Progress Notes (Signed)
Clinical Social Work Department CLINICAL SOCIAL WORK PLACEMENT NOTE 10/09/2014  Patient:  Isabella Valencia, Isabella Valencia  Account Number:  [id number] Admit date:  10/06/2014  Clinical Social Worker:  Werner Lean, LCSW  Date/time:  10/09/2014 12:29 PM  Clinical Social Work is seeking post-discharge placement for this patient at the following level of care:   SKILLED NURSING   (*CSW will update this form in Epic as items are completed)     Patient/family provided with [place] Department of Clinical Social Work's list of facilities offering this level of care within the geographic area requested by the patient (or if unable, by the patient's family).  10/06/2014  Patient/family informed of their freedom to choose among providers that offer the needed level of care, that participate in Medicare, Medicaid or managed care program needed by the patient, have an available bed and are willing to accept the patient.    Patient/family informed of [facility]' ownership interest in [facility], as well as of the fact that they are under no obligation to receive care at this facility.  PASARR submitted to EDS on 10/06/2014 PASARR number received on 10/06/2014  FL2 transmitted to all facilities in geographic area requested by pt/family on  10/06/2014 FL2 transmitted to all facilities within larger geographic area on   Patient informed that his/her managed care company has contracts with or will negotiate with  certain facilities, including the following:     Patient/family informed of bed offers received:  10/06/2014 Patient chooses bed at [facility] Physician recommends and patient chooses bed at    Patient to be transferred to [facility] on  10/09/2014 Patient to be transferred to facility by [facility] Patient and family notified of transfer on 10/09/2014 Name of family member notified:  Pt contacted family directly.  The following physician request were entered in Epic:   Additional  Comments: Pt is in agreement with d/c to SNF today. PT approved transport by car. NSG reviewed d/c summary, scripts, avs. Scripts included in d/c packet. Packet provided to pt prior to d/c.  Werner Lean LCSW [phone]

## 2014-10-09 NOTE — Telephone Encounter (Signed)
Pt was on tcm list d/c 10/09/14 ot has total right knee replacement. Will f/u with Dr. Honor Loh in 2 weeks. Did not schedule TCM appt with pcp...Johny Chess

## 2014-10-09 NOTE — Progress Notes (Signed)
     Subjective: 3 Days Post-Op Procedure(s) (LRB): RIGHT TOTAL KNEE ARTHROPLASTY (Right)   Patient reports pain as mild, pain controlled. Still complaining of nausea, but otherwise no events.  Ready to be discharged to skilled nursing facility.  Objective:   VITALS:   Filed Vitals:   10/09/14 0452  BP: 137/82  Pulse: 91  Temp: 97.7 F (36.5 C)  Resp: 16    Dorsiflexion/Plantar flexion intact Incision: dressing C/Valencia/I No cellulitis present Compartment soft  LABS  Recent Labs  10/07/14 0428 10/08/14 0435  HGB 10.9* 10.7*  HCT 33.5* 32.8*  WBC 10.9* 7.8  PLT 232 240     Recent Labs  10/07/14 0428 10/08/14 0435  NA 137 138  K 4.0 4.1  BUN 14 20  CREATININE 0.62 0.74  GLUCOSE 135* 104*     Assessment/Plan: 3 Days Post-Op Procedure(s) (LRB): RIGHT TOTAL KNEE ARTHROPLASTY (Right) Up with therapy Discharge to SNF  Follow up in 2 weeks at [facility]. Follow up with Isabella Valencia,Isabella Valencia in 2 weeks.  Contact information:  [facility] [address] Isabella Valencia   PAC  10/09/2014, 7:52 AM

## 2014-10-12 ENCOUNTER — Non-Acute Institutional Stay (SKILLED_NURSING_FACILITY): Payer: Medicare Other | Admitting: Adult Health

## 2014-10-12 ENCOUNTER — Encounter: Payer: Self-pay | Admitting: Adult Health

## 2014-10-12 DIAGNOSIS — Z79890 Hormone replacement therapy (postmenopausal): Secondary | ICD-10-CM

## 2014-10-12 DIAGNOSIS — D62 Acute posthemorrhagic anemia: Secondary | ICD-10-CM | POA: Diagnosis not present

## 2014-10-12 DIAGNOSIS — Z96651 Presence of right artificial knee joint: Secondary | ICD-10-CM | POA: Diagnosis not present

## 2014-10-12 DIAGNOSIS — M1711 Unilateral primary osteoarthritis, right knee: Secondary | ICD-10-CM | POA: Diagnosis not present

## 2014-10-12 DIAGNOSIS — K5900 Constipation, unspecified: Secondary | ICD-10-CM | POA: Diagnosis not present

## 2014-10-12 NOTE — Progress Notes (Signed)
Patient ID: Isabella Valencia, female   DOB: [birth year], 74 y.o.   MRN: [id number]   10/12/2014  Facility:  Nursing Home Location:  [place] Room Number: 169-C LEVEL OF CARE:  SNF (31)   Chief Complaint  Patient presents with  . Hospitalization Follow-up    Osteoarthritis S/P right total knee arthroplasty, anemia, constipation and HRT    HISTORY OF PRESENT ILLNESS:  This is a 74 year old female who has been admitted to [facility] on 10/09/14 from [facility] with osteoarthritis S/P right total knee arthroplasty. She has PMH of hyperlipidemia, mitral valve prolapse and asthma.  She has been admitted for a short-term rehabilitation.  PAST MEDICAL HISTORY:  Past Medical History  Diagnosis Date  . Arthritis 2004    neg ANA & RA  . Hyperlipidemia   . MVP (mitral valve prolapse)   . Complication of anesthesia     slow to wake up  . Asthma     minimal problems  . History of skin cancer   . Difficulty sleeping     due to knee pain    CURRENT MEDICATIONS: Reviewed per MAR/see medication list  Allergies  Allergen Reactions  . Triamcinolone Acetonide     Soft tissue atrophy with injection of Kenalog     REVIEW OF SYSTEMS:  GENERAL: no change in appetite, no fatigue, no weight changes, no fever, chills or weakness RESPIRATORY: no cough, SOB, DOE, wheezing, hemoptysis CARDIAC: no chest pain, edema or palpitations GI: no abdominal pain, diarrhea, heart burn, nausea or vomiting + constipation  PHYSICAL EXAMINATION  GENERAL: no acute distress, normal body habitus SKIN:   Right knee surgical incision is covered with aquacel dressing, dry, no redness EYES: conjunctivae normal, sclerae normal, normal eye lids NECK: supple, trachea midline, no neck masses, no thyroid tenderness, no thyromegaly LYMPHATICS: no LAN in the neck, no supraclavicular LAN RESPIRATORY: breathing is even & unlabored, BS CTAB CARDIAC: RRR, no murmur,no extra heart  sounds, no edema GI: abdomen soft, normal BS, no masses, no tenderness, no hepatomegaly, no splenomegaly EXTREMITIES: Able to move 4 extremities PSYCHIATRIC: the patient is alert & oriented to person, affect & behavior appropriate  LABS/RADIOLOGY: Labs reviewed: Basic Metabolic Panel:  Recent Labs  09/28/14 1102 10/07/14 0428 10/08/14 0435  NA 142 137 138  K 4.8 4.0 4.1  CL 103 105 105  CO2 30 26 27   GLUCOSE 94 135* 104*  BUN 13 14 20   CREATININE 0.79 0.62 0.74  CALCIUM 9.8 8.6 8.9   CBC:  Recent Labs  09/28/14 1102 10/07/14 0428 10/08/14 0435  WBC 5.1 10.9* 7.8  HGB 12.9 10.9* 10.7*  HCT 39.7 33.5* 32.8*  MCV 100.3* 99.4 99.7  PLT 292 232 240    ASSESSMENT/PLAN:  Osteoarthritis S/P right total knee arthroplasty - or rehabilitation; follow-up with [facility]; continue aspirin 325 mg by mouth twice a day for DVT prophylaxis, Norco 7.5/325 mg 1-2 tabs by mouth every 4 hours when necessary for pain and sent to flex 4 mg by mouth every 6 hours when necessary for muscle spasm Anemia, acute blood loss - hemoglobin 10.7; continue iron 325 mg by mouth 3 times a day Constipation - discontinue Colace and MiraLAX per patient request and start senna S2 tabs by mouth twice a day and Dulcolax suppository 1 per rectal daily when necessary HRT - continue Elastrin 0.1 mg/graft 1 application topically Q D   Goals of care:  Short-term rehabilitation   Labs/test ordered:  CBC, CMP  Spent 50 minutes in patient care.   [facility], NP [employer] [phone]

## 2014-10-13 ENCOUNTER — Non-Acute Institutional Stay (SKILLED_NURSING_FACILITY): Payer: Medicare Other | Admitting: Internal Medicine

## 2014-10-13 DIAGNOSIS — M1711 Unilateral primary osteoarthritis, right knee: Secondary | ICD-10-CM

## 2014-10-13 DIAGNOSIS — K5900 Constipation, unspecified: Secondary | ICD-10-CM

## 2014-10-13 DIAGNOSIS — R195 Other fecal abnormalities: Secondary | ICD-10-CM

## 2014-10-13 DIAGNOSIS — Z79890 Hormone replacement therapy (postmenopausal): Secondary | ICD-10-CM | POA: Diagnosis not present

## 2014-10-13 DIAGNOSIS — D62 Acute posthemorrhagic anemia: Secondary | ICD-10-CM

## 2014-10-13 NOTE — Progress Notes (Signed)
Patient ID: Isabella Valencia, female   DOB: Feb 23, 1941, 74 y.o.   MRN: [id number]     [facility]   PCP: Unice Cobble, MD  Code Status: full code  Allergies  Allergen Reactions  . Triamcinolone Acetonide     Soft tissue atrophy with injection of Kenalog    Chief Complaint  Patient presents with  . New Admit To SNF     HPI:  74 year old patient is here for short term rehabilitation post hospital admission from 10/06/14-10/09/14 with right knee osteoarthritis. She underwent right total knee arthroplasty. She is seen in her room today. She complaints of black colored stool and nausea. Denies any frank blood in stool. No other concerns. Daughter is present at bedside. Has been constipated since surgery but now having bowel movement, had one today She has PMH of HLD, arthritis, MVP and asthma  Review of Systems:  Constitutional: Negative for fever, chills, diaphoresis.  HENT: Negative for headache, congestion, nasal discharge Eyes: Negative for eye pain, blurred vision, double vision and discharge.  Respiratory: Negative for cough, shortness of breath and wheezing.   Cardiovascular: Negative for chest pain, palpitations. Positive for leg swelling.  Gastrointestinal: Negative for heartburn, nausea, vomiting, abdominal pain, rectal bleed, diarrhea. Has constipation Genitourinary: Negative for dysuria Musculoskeletal: Negative for back pain, falls Skin: Negative for itching, rash.  Neurological: positive for weakness. Negative for dizziness, tingling, focal weakness Psychiatric/Behavioral: Negative for depression, anxiety, insomnia and memory loss.    Past Medical History  Diagnosis Date  . Arthritis 2004    neg ANA & RA  . Hyperlipidemia   . MVP (mitral valve prolapse)   . Complication of anesthesia     slow to wake up  . Asthma     minimal problems  . History of skin cancer   . Difficulty sleeping     due to knee pain   Past Surgical  History  Procedure Laterality Date  . Elbow surgery       X3  . Cataract extraction    . Abdominal hysterectomy    . Tubal ligation    . Cosmetic eye surgery    . Colonoscopy      every 5 years ; Dr Earlean Shawl  . Knee arthroscopy      rt knee  . Total knee arthroplasty Right 10/06/2014    Procedure: RIGHT TOTAL KNEE ARTHROPLASTY;  Surgeon: Paralee Cancel, MD;  Location: [facility];  Service: Orthopedics;  Laterality: Right;   Social History:   reports that she has never smoked. She does not have any smokeless tobacco history on file. She reports that she drinks alcohol. She reports that she does not use illicit drugs.  Family History  Problem Relation Age of Onset  . Arthritis Mother   . Colon cancer Mother   . Stroke Father   . Colon cancer Maternal Aunt   . Colon cancer Maternal Grandmother   . Colon polyps Brother     Medications: Patient's Medications  New Prescriptions   No medications on file  Previous Medications   ASPIRIN EC 325 MG EC TABLET    Take 1 tablet (325 mg total) by mouth 2 (two) times daily.   DOCUSATE SODIUM (COLACE) 100 MG CAPSULE    Take 1 capsule (100 mg total) by mouth 2 (two) times daily.   ESTRADIOL (ELESTRIN TD)    Place 1 application onto the skin every morning.   FERROUS SULFATE 325 (65 FE) MG TABLET  Take 1 tablet (325 mg total) by mouth 3 (three) times daily after meals.   HYDROCODONE-ACETAMINOPHEN (NORCO) 7.5-325 MG PER TABLET    Take 1-2 tablets by mouth every 4 (four) hours as needed for moderate pain.   MULTIPLE VITAMIN (ONE-A-DAY ESSENTIAL) TABS    Take 1 tablet by mouth every morning.    POLYETHYLENE GLYCOL (MIRALAX / GLYCOLAX) PACKET    Take 17 g by mouth 2 (two) times daily.   PROMETHAZINE (PHENERGAN) 12.5 MG TABLET    Take 1 tablet (12.5 mg total) by mouth every 6 (six) hours as needed for nausea or vomiting.   TIZANIDINE (ZANAFLEX) 4 MG TABLET    Take 1 tablet (4 mg total) by mouth every 6 (six) hours as needed for muscle spasms.  Modified  Medications   No medications on file  Discontinued Medications   No medications on file     Physical Exam: Filed Vitals:   10/13/14 1355  BP: 110/71  Pulse: 85  Temp: 98.3 F (36.8 C)  Resp: 16  Weight: 169 lb 12.8 oz (77.021 kg)  SpO2: 94%    General- elderly female, well built, in no acute distress Head- normocephalic, atraumatic Throat- moist mucus membrane Eyes- PERRLA, EOMI, no pallor, no icterus, no discharge, normal conjunctiva, normal sclera Neck- no cervical lymphadenopathy Cardiovascular- normal s1,s2, no murmurs, palpable dorsalis pedis and radial pulses, 1+ leg edema Respiratory- bilateral clear to auscultation, no wheeze, no rhonchi, no crackles, no use of accessory muscles Abdomen- bowel sounds present, soft, non tender Musculoskeletal- able to move all 4 extremities, limited right knee range of motion  Neurological- no focal deficit Skin- warm and dry, aquacel dressing to right knee Psychiatry- alert and oriented to person, place and time, normal mood and affect    Labs reviewed: Basic Metabolic Panel:  Recent Labs  09/28/14 1102 10/07/14 0428 10/08/14 0435  NA 142 137 138  K 4.8 4.0 4.1  CL 103 105 105  CO2 30 26 27   GLUCOSE 94 135* 104*  BUN 13 14 20   CREATININE 0.79 0.62 0.74  CALCIUM 9.8 8.6 8.9   CBC:  Recent Labs  09/28/14 1102 10/07/14 0428 10/08/14 0435  WBC 5.1 10.9* 7.8  HGB 12.9 10.9* 10.7*  HCT 39.7 33.5* 32.8*  MCV 100.3* 99.4 99.7  PLT 292 232 240    Assessment/Plan  Right knee Osteoarthritis  S/P right total knee arthroplasty. Will have her work with physical therapy and occupational therapy team to help with gait training and muscle strengthening exercises.fall precautions. Skin care. Encourage to be out of bed. continue aspirin 325 mg bid but change it to [facility] for DVT prophylaxis, Norco 7.5/325 mg 1-2 tabs q4h prn pain and zenaflex 4 mg q6h prn muscle spasm  Dark colored stools Likely from being on iron. She is also  on aspirin, check stool x 3 for blood. Pending h&h  Anemia, acute blood loss On iron 325 mg tid, monitor h&h and if hb > 10, change iron to 325 mg daily and monitor  Constipation  continue senna S 2 tabs bid and Dulcolax suppository daily prn with miralx bid  HRT continue Elastrin 0.1 mg/graft 1 application topically Q D   Goals of care: short term rehabilitation    Labs/tests ordered: cbc, cmp  Family/ staff Communication: reviewed care plan with patient and nursing supervisor    Blanchie Serve, MD  [facility] [phone] (Monday-Friday 8 am - 5 pm) [phone] (afterhours)

## 2014-10-18 DIAGNOSIS — Z96651 Presence of right artificial knee joint: Secondary | ICD-10-CM | POA: Diagnosis not present

## 2014-10-18 DIAGNOSIS — Z9181 History of falling: Secondary | ICD-10-CM | POA: Diagnosis not present

## 2014-10-18 DIAGNOSIS — Z471 Aftercare following joint replacement surgery: Secondary | ICD-10-CM | POA: Diagnosis not present

## 2014-10-19 DIAGNOSIS — Z9181 History of falling: Secondary | ICD-10-CM | POA: Diagnosis not present

## 2014-10-19 DIAGNOSIS — Z471 Aftercare following joint replacement surgery: Secondary | ICD-10-CM | POA: Diagnosis not present

## 2014-10-19 DIAGNOSIS — Z96651 Presence of right artificial knee joint: Secondary | ICD-10-CM | POA: Diagnosis not present

## 2014-10-21 DIAGNOSIS — Z96651 Presence of right artificial knee joint: Secondary | ICD-10-CM | POA: Diagnosis not present

## 2014-10-21 DIAGNOSIS — Z471 Aftercare following joint replacement surgery: Secondary | ICD-10-CM | POA: Diagnosis not present

## 2014-10-21 DIAGNOSIS — Z9181 History of falling: Secondary | ICD-10-CM | POA: Diagnosis not present

## 2014-10-24 DIAGNOSIS — Z471 Aftercare following joint replacement surgery: Secondary | ICD-10-CM | POA: Diagnosis not present

## 2014-10-24 DIAGNOSIS — Z96651 Presence of right artificial knee joint: Secondary | ICD-10-CM | POA: Diagnosis not present

## 2014-10-24 DIAGNOSIS — Z9181 History of falling: Secondary | ICD-10-CM | POA: Diagnosis not present

## 2014-10-26 DIAGNOSIS — Z9181 History of falling: Secondary | ICD-10-CM | POA: Diagnosis not present

## 2014-10-26 DIAGNOSIS — Z471 Aftercare following joint replacement surgery: Secondary | ICD-10-CM | POA: Diagnosis not present

## 2014-10-26 DIAGNOSIS — Z96651 Presence of right artificial knee joint: Secondary | ICD-10-CM | POA: Diagnosis not present

## 2014-10-28 DIAGNOSIS — Z471 Aftercare following joint replacement surgery: Secondary | ICD-10-CM | POA: Diagnosis not present

## 2014-10-28 DIAGNOSIS — Z96651 Presence of right artificial knee joint: Secondary | ICD-10-CM | POA: Diagnosis not present

## 2014-10-28 DIAGNOSIS — Z9181 History of falling: Secondary | ICD-10-CM | POA: Diagnosis not present

## 2014-10-29 DIAGNOSIS — Z9181 History of falling: Secondary | ICD-10-CM | POA: Diagnosis not present

## 2014-10-29 DIAGNOSIS — Z471 Aftercare following joint replacement surgery: Secondary | ICD-10-CM | POA: Diagnosis not present

## 2014-10-29 DIAGNOSIS — Z96651 Presence of right artificial knee joint: Secondary | ICD-10-CM | POA: Diagnosis not present

## 2014-11-02 DIAGNOSIS — R262 Difficulty in walking, not elsewhere classified: Secondary | ICD-10-CM | POA: Diagnosis not present

## 2014-11-02 DIAGNOSIS — M1711 Unilateral primary osteoarthritis, right knee: Secondary | ICD-10-CM | POA: Diagnosis not present

## 2014-11-02 DIAGNOSIS — M25561 Pain in right knee: Secondary | ICD-10-CM | POA: Diagnosis not present

## 2014-11-04 DIAGNOSIS — R262 Difficulty in walking, not elsewhere classified: Secondary | ICD-10-CM | POA: Diagnosis not present

## 2014-11-04 DIAGNOSIS — M25561 Pain in right knee: Secondary | ICD-10-CM | POA: Diagnosis not present

## 2014-11-04 DIAGNOSIS — M1711 Unilateral primary osteoarthritis, right knee: Secondary | ICD-10-CM | POA: Diagnosis not present

## 2014-11-06 DIAGNOSIS — R262 Difficulty in walking, not elsewhere classified: Secondary | ICD-10-CM | POA: Diagnosis not present

## 2014-11-06 DIAGNOSIS — M25561 Pain in right knee: Secondary | ICD-10-CM | POA: Diagnosis not present

## 2014-11-06 DIAGNOSIS — M1711 Unilateral primary osteoarthritis, right knee: Secondary | ICD-10-CM | POA: Diagnosis not present

## 2014-11-09 DIAGNOSIS — M1711 Unilateral primary osteoarthritis, right knee: Secondary | ICD-10-CM | POA: Diagnosis not present

## 2014-11-09 DIAGNOSIS — M25561 Pain in right knee: Secondary | ICD-10-CM | POA: Diagnosis not present

## 2014-11-09 DIAGNOSIS — R262 Difficulty in walking, not elsewhere classified: Secondary | ICD-10-CM | POA: Diagnosis not present

## 2014-11-11 DIAGNOSIS — M25561 Pain in right knee: Secondary | ICD-10-CM | POA: Diagnosis not present

## 2014-11-11 DIAGNOSIS — M1711 Unilateral primary osteoarthritis, right knee: Secondary | ICD-10-CM | POA: Diagnosis not present

## 2014-11-11 DIAGNOSIS — R262 Difficulty in walking, not elsewhere classified: Secondary | ICD-10-CM | POA: Diagnosis not present

## 2014-11-13 DIAGNOSIS — M25561 Pain in right knee: Secondary | ICD-10-CM | POA: Diagnosis not present

## 2014-11-13 DIAGNOSIS — M1711 Unilateral primary osteoarthritis, right knee: Secondary | ICD-10-CM | POA: Diagnosis not present

## 2014-11-13 DIAGNOSIS — R262 Difficulty in walking, not elsewhere classified: Secondary | ICD-10-CM | POA: Diagnosis not present

## 2014-11-16 DIAGNOSIS — R262 Difficulty in walking, not elsewhere classified: Secondary | ICD-10-CM | POA: Diagnosis not present

## 2014-11-16 DIAGNOSIS — M25561 Pain in right knee: Secondary | ICD-10-CM | POA: Diagnosis not present

## 2014-11-16 DIAGNOSIS — M1711 Unilateral primary osteoarthritis, right knee: Secondary | ICD-10-CM | POA: Diagnosis not present

## 2014-11-18 DIAGNOSIS — R262 Difficulty in walking, not elsewhere classified: Secondary | ICD-10-CM | POA: Diagnosis not present

## 2014-11-18 DIAGNOSIS — M1711 Unilateral primary osteoarthritis, right knee: Secondary | ICD-10-CM | POA: Diagnosis not present

## 2014-11-18 DIAGNOSIS — M25561 Pain in right knee: Secondary | ICD-10-CM | POA: Diagnosis not present

## 2014-11-20 DIAGNOSIS — R262 Difficulty in walking, not elsewhere classified: Secondary | ICD-10-CM | POA: Diagnosis not present

## 2014-11-20 DIAGNOSIS — M1711 Unilateral primary osteoarthritis, right knee: Secondary | ICD-10-CM | POA: Diagnosis not present

## 2014-11-20 DIAGNOSIS — M25561 Pain in right knee: Secondary | ICD-10-CM | POA: Diagnosis not present

## 2014-11-24 DIAGNOSIS — M1711 Unilateral primary osteoarthritis, right knee: Secondary | ICD-10-CM | POA: Diagnosis not present

## 2014-11-24 DIAGNOSIS — M25561 Pain in right knee: Secondary | ICD-10-CM | POA: Diagnosis not present

## 2014-11-24 DIAGNOSIS — R262 Difficulty in walking, not elsewhere classified: Secondary | ICD-10-CM | POA: Diagnosis not present

## 2014-11-25 DIAGNOSIS — Z471 Aftercare following joint replacement surgery: Secondary | ICD-10-CM | POA: Diagnosis not present

## 2014-11-25 DIAGNOSIS — Z96651 Presence of right artificial knee joint: Secondary | ICD-10-CM | POA: Diagnosis not present

## 2014-11-27 DIAGNOSIS — R262 Difficulty in walking, not elsewhere classified: Secondary | ICD-10-CM | POA: Diagnosis not present

## 2014-11-27 DIAGNOSIS — M25561 Pain in right knee: Secondary | ICD-10-CM | POA: Diagnosis not present

## 2014-11-27 DIAGNOSIS — M1711 Unilateral primary osteoarthritis, right knee: Secondary | ICD-10-CM | POA: Diagnosis not present

## 2014-12-01 DIAGNOSIS — M25561 Pain in right knee: Secondary | ICD-10-CM | POA: Diagnosis not present

## 2014-12-01 DIAGNOSIS — R262 Difficulty in walking, not elsewhere classified: Secondary | ICD-10-CM | POA: Diagnosis not present

## 2014-12-01 DIAGNOSIS — M1711 Unilateral primary osteoarthritis, right knee: Secondary | ICD-10-CM | POA: Diagnosis not present

## 2014-12-02 ENCOUNTER — Telehealth: Payer: Self-pay

## 2014-12-02 NOTE — Telephone Encounter (Signed)
Patient stated she did mammogram sometime in April, 2015 at [facility] on [address]---overdue health maint updated with that date

## 2014-12-04 DIAGNOSIS — M25561 Pain in right knee: Secondary | ICD-10-CM | POA: Diagnosis not present

## 2014-12-04 DIAGNOSIS — M1711 Unilateral primary osteoarthritis, right knee: Secondary | ICD-10-CM | POA: Diagnosis not present

## 2014-12-04 DIAGNOSIS — R262 Difficulty in walking, not elsewhere classified: Secondary | ICD-10-CM | POA: Diagnosis not present

## 2015-01-01 DIAGNOSIS — D485 Neoplasm of uncertain behavior of skin: Secondary | ICD-10-CM | POA: Diagnosis not present

## 2015-01-01 DIAGNOSIS — L814 Other melanin hyperpigmentation: Secondary | ICD-10-CM | POA: Diagnosis not present

## 2015-01-01 DIAGNOSIS — L821 Other seborrheic keratosis: Secondary | ICD-10-CM | POA: Diagnosis not present

## 2015-01-01 DIAGNOSIS — L570 Actinic keratosis: Secondary | ICD-10-CM | POA: Diagnosis not present

## 2015-01-01 DIAGNOSIS — Z85828 Personal history of other malignant neoplasm of skin: Secondary | ICD-10-CM | POA: Diagnosis not present

## 2015-01-06 DIAGNOSIS — Z471 Aftercare following joint replacement surgery: Secondary | ICD-10-CM | POA: Diagnosis not present

## 2015-01-06 DIAGNOSIS — M1712 Unilateral primary osteoarthritis, left knee: Secondary | ICD-10-CM | POA: Diagnosis not present

## 2015-01-06 DIAGNOSIS — Z96651 Presence of right artificial knee joint: Secondary | ICD-10-CM | POA: Diagnosis not present

## 2015-01-06 DIAGNOSIS — M25562 Pain in left knee: Secondary | ICD-10-CM | POA: Diagnosis not present

## 2015-01-19 DIAGNOSIS — M1712 Unilateral primary osteoarthritis, left knee: Secondary | ICD-10-CM | POA: Diagnosis not present

## 2015-01-26 DIAGNOSIS — M1711 Unilateral primary osteoarthritis, right knee: Secondary | ICD-10-CM | POA: Diagnosis not present

## 2015-02-02 DIAGNOSIS — M1711 Unilateral primary osteoarthritis, right knee: Secondary | ICD-10-CM | POA: Diagnosis not present

## 2015-02-02 DIAGNOSIS — M25561 Pain in right knee: Secondary | ICD-10-CM | POA: Diagnosis not present

## 2015-03-17 DIAGNOSIS — M1712 Unilateral primary osteoarthritis, left knee: Secondary | ICD-10-CM | POA: Diagnosis not present

## 2015-04-27 DIAGNOSIS — H524 Presbyopia: Secondary | ICD-10-CM | POA: Diagnosis not present

## 2015-08-27 ENCOUNTER — Ambulatory Visit: Payer: Self-pay | Admitting: Family

## 2016-02-18 ENCOUNTER — Encounter: Payer: Self-pay | Admitting: Internal Medicine

## 2016-02-18 ENCOUNTER — Ambulatory Visit (INDEPENDENT_AMBULATORY_CARE_PROVIDER_SITE_OTHER): Payer: Medicare Other | Admitting: Internal Medicine

## 2016-02-18 VITALS — BP 124/76 | HR 86 | Temp 98.2°F | Resp 16 | Wt 165.0 lb

## 2016-02-18 DIAGNOSIS — E785 Hyperlipidemia, unspecified: Secondary | ICD-10-CM | POA: Diagnosis not present

## 2016-02-18 DIAGNOSIS — Z23 Encounter for immunization: Secondary | ICD-10-CM | POA: Diagnosis not present

## 2016-02-18 NOTE — Progress Notes (Signed)
Pre visit review using our clinic review tool, if applicable. No additional management support is needed unless otherwise documented below in the visit note. 

## 2016-02-18 NOTE — Assessment & Plan Note (Addendum)
Familial Deferred blood work today Will start exercising and work on weight loss

## 2016-02-18 NOTE — Progress Notes (Signed)
Subjective:    Patient ID: Isabella Valencia, female    DOB: 1940-10-28, 75 y.o.   MRN: [id number]  HPI She is here to establish with a new pcp.  She is here for follow up.   She has no concerns.    She did taper herself off the estrogen recently.  She is having some hot flashes.   Her last dexa was normal.  She does not exercise, but takes calcium and vitamin d daily.    Hyperlipidemia:  She has a history of high cholesterol and believes it is genetic.  She wants to avoid medication and is not too concerned about her cholesterol.  She is not currently exercising recently and knows she needs to.  She knows she needs to lose.    Medications and allergies reviewed with patient and updated if appropriate.  Patient Active Problem List   Diagnosis Date Noted  . Overweight (BMI 25.0-29.9) 10/08/2014  . 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 S/P knee replacement 10/06/2014  . History of anesthesia reaction 08/20/2014  . RBBB (right bundle branch block) 05/13/2014  . Hyperlipidemia 11/04/2008  . DRY EYE SYNDROME 11/04/2008  . Intermittent asthma without complication [id number]  . Osteoarthritis 11/04/2008  . HEADACHE 04/03/2008    Current Outpatient Prescriptions on File Prior to Visit  Medication Sig Dispense Refill  . Multiple Vitamin (ONE-A-DAY ESSENTIAL) TABS Take 1 tablet by mouth every morning.      No current facility-administered medications on file prior to visit.     Past Medical History:  Diagnosis Date  . Arthritis 2004   neg ANA & RA  . Asthma    minimal problems  . Complication of anesthesia    slow to wake up  . Difficulty sleeping    due to knee pain  . History of skin cancer   . Hyperlipidemia   . MVP (mitral valve prolapse)     Past Surgical History:  Procedure Laterality Date  . ABDOMINAL HYSTERECTOMY    . CATARACT EXTRACTION    . COLONOSCOPY     every 5 years ; Dr Earlean Shawl  . cosmetic eye surgery    . ELBOW SURGERY      X3  . KNEE ARTHROSCOPY     rt knee    . TOTAL KNEE ARTHROPLASTY Right 10/06/2014   Procedure: RIGHT TOTAL KNEE ARTHROPLASTY;  Surgeon: Paralee Cancel, MD;  Location: [facility];  Service: Orthopedics;  Laterality: Right;  . TUBAL LIGATION      Social History   Social History  . Marital status: Divorced    Spouse name: N/A  . Number of children: N/A  . Years of education: N/A   Social History Main Topics  . Smoking status: Never Smoker  . Smokeless tobacco: None  . Alcohol use 0.0 oz/week     Comment:  wine socially  . Drug use: No  . Sexual activity: Not Asked   Other Topics Concern  . None   Social History Narrative   No regular exercise    Family History  Problem Relation Age of Onset  . Arthritis Mother   . Colon cancer Mother   . Stroke Father   . Colon cancer Maternal Aunt   . Colon cancer Maternal Grandmother   . Colon polyps Brother     Review of Systems  Constitutional: Negative for chills and fever.       Hot flashes  Respiratory: Negative for cough, shortness of breath and wheezing.   Cardiovascular:  Negative for chest pain, palpitations and leg swelling.  Gastrointestinal: Positive for diarrhea (after eating salads). Negative for abdominal pain and nausea.       No gerd  Musculoskeletal: Positive for arthralgias (knee pain - OA) and back pain (chronic lower back).  Neurological: Negative for dizziness, light-headedness and headaches.  Psychiatric/Behavioral: Negative for dysphoric mood. The patient is not nervous/anxious.        Objective:   Vitals:   02/18/16 1524  BP: 124/76  Pulse: 86  Resp: 16  Temp: 98.2 F (36.8 C)   Filed Weights   02/18/16 1524  Weight: 165 lb (74.8 kg)   Body mass index is 28.32 kg/m.   Physical Exam Constitutional: Appears well-developed and well-nourished. No distress.  HENT:  Head: Normocephalic and atraumatic.  Neck: Neck supple. No tracheal deviation present. No thyromegaly present.  Cardiovascular: Normal rate, regular rhythm and normal heart  sounds.   No murmur heard. No carotid bruit  Pulmonary/Chest: Effort normal and breath sounds normal. No respiratory distress. No has no wheezes. No rales.  Musculoskeletal: No edema.  Lymphadenopathy: No cervical adenopathy.  Skin: Skin is warm and dry. Not diaphoretic.  Psychiatric: Normal mood and affect. Behavior is normal.       Assessment & Plan:   prevnar today  She deferred blood work   See Problem List for Assessment and Plan of chronic medical problems.

## 2016-02-18 NOTE — Patient Instructions (Addendum)
You should try to get 1200mg  of calcium a day and [phone] units of vitamin D a day.    Ideally start exercising regularly.  Test(s) ordered today. Your results will be released to [place] (or called to you) after review, usually within 72hours after test completion. If any changes need to be made, you will be notified at that same time.  All other Health Maintenance issues reviewed.   All recommended immunizations and age-appropriate screenings are up-to-date or discussed.  prevnar vaccine administered today.   Medications reviewed and updated.  No changes recommended at this time.  Please followup in one year for a physical, sooner if needed

## 2016-02-26 ENCOUNTER — Emergency Department (HOSPITAL_COMMUNITY): Payer: Medicare Other

## 2016-02-26 ENCOUNTER — Observation Stay (HOSPITAL_COMMUNITY)
Admission: EM | Admit: 2016-02-26 | Discharge: 2016-02-28 | Disposition: A | Discharge status: Home / Self Care | Payer: Medicare Other | Attending: Internal Medicine | Admitting: Internal Medicine

## 2016-02-26 ENCOUNTER — Observation Stay (HOSPITAL_COMMUNITY): Payer: Medicare Other

## 2016-02-26 ENCOUNTER — Encounter (HOSPITAL_COMMUNITY): Payer: Self-pay | Admitting: Emergency Medicine

## 2016-02-26 DIAGNOSIS — R7989 Other specified abnormal findings of blood chemistry: Secondary | ICD-10-CM

## 2016-02-26 DIAGNOSIS — R749 Abnormal serum enzyme level, unspecified: Secondary | ICD-10-CM | POA: Diagnosis not present

## 2016-02-26 DIAGNOSIS — R778 Other specified abnormalities of plasma proteins: Secondary | ICD-10-CM | POA: Diagnosis present

## 2016-02-26 DIAGNOSIS — Z96651 Presence of right artificial knee joint: Secondary | ICD-10-CM | POA: Insufficient documentation

## 2016-02-26 DIAGNOSIS — R079 Chest pain, unspecified: Secondary | ICD-10-CM | POA: Diagnosis present

## 2016-02-26 DIAGNOSIS — J45909 Unspecified asthma, uncomplicated: Secondary | ICD-10-CM | POA: Insufficient documentation

## 2016-02-26 DIAGNOSIS — I341 Nonrheumatic mitral (valve) prolapse: Secondary | ICD-10-CM

## 2016-02-26 DIAGNOSIS — J4520 Mild intermittent asthma, uncomplicated: Secondary | ICD-10-CM | POA: Diagnosis present

## 2016-02-26 DIAGNOSIS — M542 Cervicalgia: Secondary | ICD-10-CM | POA: Insufficient documentation

## 2016-02-26 DIAGNOSIS — E785 Hyperlipidemia, unspecified: Secondary | ICD-10-CM | POA: Insufficient documentation

## 2016-02-26 DIAGNOSIS — I340 Nonrheumatic mitral (valve) insufficiency: Secondary | ICD-10-CM | POA: Diagnosis present

## 2016-02-26 DIAGNOSIS — R9431 Abnormal electrocardiogram [ECG] [EKG]: Secondary | ICD-10-CM | POA: Diagnosis present

## 2016-02-26 DIAGNOSIS — Z85828 Personal history of other malignant neoplasm of skin: Secondary | ICD-10-CM | POA: Insufficient documentation

## 2016-02-26 DIAGNOSIS — M1990 Unspecified osteoarthritis, unspecified site: Secondary | ICD-10-CM | POA: Diagnosis present

## 2016-02-26 HISTORY — DX: Cervical disc disorder, unspecified, unspecified cervical region: M50.90

## 2016-02-26 LAB — I-STAT TROPONIN, ED
TROPONIN I, POC: 0.14 ng/mL — AB (ref 0.00–0.08)
Troponin i, poc: 0.19 ng/mL (ref 0.00–0.08)
Troponin i, poc: 0.19 ng/mL (ref 0.00–0.08)

## 2016-02-26 LAB — CBC
HEMATOCRIT: 37.6 % (ref 36.0–46.0)
Hemoglobin: 12.3 g/dL (ref 12.0–15.0)
MCH: 32.4 pg (ref 26.0–34.0)
MCHC: 32.7 g/dL (ref 30.0–36.0)
MCV: 98.9 fL (ref 78.0–100.0)
Platelets: 222 10*3/uL (ref 150–400)
RBC: 3.8 MIL/uL — ABNORMAL LOW (ref 3.87–5.11)
RDW: 13.3 % (ref 11.5–15.5)
WBC: 8.5 10*3/uL (ref 4.0–10.5)

## 2016-02-26 LAB — TROPONIN I
TROPONIN I: 0.18 ng/mL — AB (ref <0.03)
Troponin I: 0.15 ng/mL (ref <0.03)

## 2016-02-26 LAB — MAGNESIUM: MAGNESIUM: 1.8 mg/dL (ref 1.7–2.4)

## 2016-02-26 LAB — BASIC METABOLIC PANEL
Anion gap: 8 (ref 5–15)
BUN: 11 mg/dL (ref 6–20)
CALCIUM: 9.6 mg/dL (ref 8.9–10.3)
CO2: 28 mmol/L (ref 22–32)
CREATININE: 0.8 mg/dL (ref 0.44–1.00)
Chloride: 101 mmol/L (ref 101–111)
GFR calc non Af Amer: >60 mL/min (ref >60)
Glucose, Bld: 83 mg/dL (ref 65–99)
Potassium: 4.1 mmol/L (ref 3.5–5.1)
Sodium: 137 mmol/L (ref 135–145)

## 2016-02-26 MED ORDER — SODIUM CHLORIDE 0.9% FLUSH
3.0000 mL | Freq: Two times a day (BID) | INTRAVENOUS | Status: DC
  Administered 2016-02-26 – 2016-02-28 (×4): 3 mL via INTRAVENOUS

## 2016-02-26 MED ORDER — ASPIRIN EC 81 MG PO TBEC
81.0000 mg | DELAYED_RELEASE_TABLET | Freq: Every day | ORAL | Status: DC
  Administered 2016-02-27 – 2016-02-28 (×2): 81 mg via ORAL
  Filled 2016-02-26 (×2): qty 1

## 2016-02-26 MED ORDER — ASPIRIN EC 81 MG PO TBEC: 81.0000 mg | DELAYED_RELEASE_TABLET | Freq: Every day | ORAL | Status: DC

## 2016-02-26 MED ORDER — ENOXAPARIN SODIUM 40 MG/0.4ML ~~LOC~~ SOLN: 40.0000 mg | SUBCUTANEOUS | Status: DC

## 2016-02-26 MED ORDER — FAMOTIDINE 20 MG PO TABS
20.0000 mg | ORAL_TABLET | Freq: Two times a day (BID) | ORAL | Status: DC
  Administered 2016-02-26 – 2016-02-28 (×4): 20 mg via ORAL
  Filled 2016-02-26 (×4): qty 1

## 2016-02-26 MED ORDER — OXYCODONE-ACETAMINOPHEN 5-325 MG PO TABS
1.0000 | ORAL_TABLET | Freq: Once | ORAL | Status: AC
  Administered 2016-02-26: 1 via ORAL
  Filled 2016-02-26: qty 1

## 2016-02-26 MED ORDER — ENOXAPARIN SODIUM 40 MG/0.4ML ~~LOC~~ SOLN
40.0000 mg | SUBCUTANEOUS | Status: DC
  Administered 2016-02-26 – 2016-02-27 (×2): 40 mg via SUBCUTANEOUS
  Filled 2016-02-26 (×2): qty 0.4

## 2016-02-26 MED ORDER — ACETAMINOPHEN 325 MG PO TABS
650.0000 mg | ORAL_TABLET | Freq: Four times a day (QID) | ORAL | Status: DC | PRN
  Administered 2016-02-27 – 2016-02-28 (×2): 650 mg via ORAL
  Filled 2016-02-26 (×2): qty 2

## 2016-02-26 MED ORDER — TRAMADOL HCL 50 MG PO TABS
50.0000 mg | ORAL_TABLET | Freq: Four times a day (QID) | ORAL | Status: DC | PRN
  Administered 2016-02-27 – 2016-02-28 (×4): 50 mg via ORAL
  Filled 2016-02-26 (×4): qty 1

## 2016-02-26 MED ORDER — CYCLOBENZAPRINE HCL 10 MG PO TABS
5.0000 mg | ORAL_TABLET | Freq: Once | ORAL | Status: AC
  Administered 2016-02-26: 5 mg via ORAL
  Filled 2016-02-26: qty 1

## 2016-02-26 MED ORDER — NITROGLYCERIN 0.4 MG SL SUBL: 0.4000 mg | SUBLINGUAL_TABLET | SUBLINGUAL | Status: DC | PRN

## 2016-02-26 MED ORDER — ONDANSETRON HCL 4 MG/2ML IJ SOLN: 4.0000 mg | Freq: Four times a day (QID) | INTRAMUSCULAR | Status: DC | PRN

## 2016-02-26 MED ORDER — NITROGLYCERIN 0.4 MG/SPRAY TL SOLN: 1.0000 | Status: DC | PRN

## 2016-02-26 MED ORDER — KETOROLAC TROMETHAMINE 30 MG/ML IJ SOLN: 30.0000 mg | Freq: Once | INTRAMUSCULAR | Status: DC

## 2016-02-26 NOTE — ED Notes (Signed)
Pt transported to xray 

## 2016-02-26 NOTE — ED Triage Notes (Signed)
Onset of left neck pain on Tuesday; worse with movement and laying down. Went to urgent care in [place] today for the neck pain and they noted abnormal EKG with no old EKG for comparison. Given 325mg  of ASA and sent for evaluation. Denies any chest pain or pressure. Denies SOB. Denies N/V/diaphoresis. Neck pain continues as chief complaint.

## 2016-02-26 NOTE — ED Notes (Signed)
Patient returned from X-ray; transporting to floor at this time.

## 2016-02-26 NOTE — ED Notes (Addendum)
CRITICAL VALUE ALERT  Critical value received:  Trop 0.14 Date of notification:  02/26/16  Time of notification:  1615  Critical value read back: yes  MD notified: Inda Castle

## 2016-02-26 NOTE — ED Notes (Signed)
CRITICAL VALUE ALERT  Critical value received:  Trop 0.15  Date of notification:  02/26/16  Time of notification:  [id number]  Critical value read back: yes  MD notified: Inda Castle

## 2016-02-26 NOTE — H&P (Signed)
History and Physical    Isabella Valencia [id number] DOB: 1941-02-27 DOA: 02/26/2016  PCP: Binnie Rail, MD   Patient coming from: Home.  Chief Complaint: Chest pain.  HPI: Isabella Valencia is a 75 y.o. female with medical history significant of Osteoarthritis, asthma,skin cancer, hyperlipidemia, mitral valve prolapse. In the emergency department with complaints of having an episode of chest pain earlier this week.  Per patient, around 2200 on Tuesday evening, she developed mild chest pressure. She subsequently was able to go to sleep, but states that she woke up around 0300 Wednesday morning with neck pain that radiated to her chest, producing and pressure-like discomfort but denies dyspnea, dizziness, palpitations, diaphoresis, orthopnea, PND or pitting edema lower extremities.   She states that she took some hydrocodone that she had available from previous surgery,  an aspirin and went back to sleep. Since then, she has been taking a daily aspirin using tramadol during the day time and hydrocodone at bedtime to avoid pain. However, she states that her neck pain has worsened since Tuesday Socially he went to the urgent care facility in [place], where they performed an EKG which was abnormal and no old EKG was available for comparison.  They gave the patient  325 mg of aspirin and refer her to the emergency department.  ED Course: Work up in the emergency department shows abnormal EKG with ST segment/T wave abnormalities and mildly positive troponin levels. She is currently chest pain free and her neck pain is still bothering her, but is not too intense at the moment. However, she stated that she would like to have something for pain as needed.  Review of Systems: As per HPI otherwise 10 point review of systems negative.   Past Medical History:  Diagnosis Date  . Arthritis 2004   neg ANA & RA  . Asthma    minimal problems  . Complication of anesthesia    slow to wake up  . Difficulty  sleeping    due to knee pain  . History of skin cancer   . Hyperlipidemia   . MVP (mitral valve prolapse)     Past Surgical History:  Procedure Laterality Date  . ABDOMINAL HYSTERECTOMY    . CATARACT EXTRACTION    . COLONOSCOPY     every 5 years ; Dr Earlean Shawl  . cosmetic eye surgery    . ELBOW SURGERY      X3  . KNEE ARTHROSCOPY     rt knee  . TOTAL KNEE ARTHROPLASTY Right 10/06/2014   Procedure: RIGHT TOTAL KNEE ARTHROPLASTY;  Surgeon: Paralee Cancel, MD;  Location: [facility];  Service: Orthopedics;  Laterality: Right;  . TUBAL LIGATION       reports that she has never smoked. She has never used smokeless tobacco. She reports that she drinks alcohol. She reports that she does not use drugs.  Allergies  Allergen Reactions  . Triamcinolone Acetonide     Soft tissue atrophy with injection of Kenalog  . Adhesive [Tape] Rash    Tears skin, Please use "paper" tape    Family History  Problem Relation Age of Onset  . Arthritis Mother   . Colon cancer Mother   . Stroke Father   . Colon cancer Maternal Aunt   . Colon cancer Maternal Grandmother   . Colon polyps Brother     Prior to Admission medications   Medication Sig Start Date End Date Taking? Authorizing Provider  Calcium Citrate-Vitamin D (CALCIUM + D PO)  Take 1 tablet by mouth daily.   Yes Historical Provider, MD  Multiple Vitamin (ONE-A-DAY ESSENTIAL) TABS Take 1 tablet by mouth every morning.    Yes Historical Provider, MD    Physical Exam: Vitals:   02/26/16 1630 02/26/16 1700 02/26/16 1730 02/26/16 1800  BP: 114/60 104/82 111/55 99/56  Pulse: 80 90 85 87  Resp: 14 13 19 16   Temp:      TempSrc:      SpO2: 97% 97% 97% 99%  Weight:      Height:          Constitutional: NAD, calm, comfortable Vitals:   02/26/16 1630 02/26/16 1700 02/26/16 1730 02/26/16 1800  BP: 114/60 104/82 111/55 99/56  Pulse: 80 90 85 87  Resp: 14 13 19 16   Temp:      TempSrc:      SpO2: 97% 97% 97% 99%  Weight:      Height:        Eyes: PERRL, lids and conjunctivae normal ENMT: Mucous membranes are moist. Posterior pharynx clear of any exudate or lesions. Neck: normal, supple, no masses, no thyromegaly Respiratory: clear to auscultation bilaterally, no wheezing, no crackles. Normal respiratory effort. No accessory muscle use.  Cardiovascular: Regular rate and rhythm,  positive systolic murmur, no rubs / gallops. No extremity edema. 2+ pedal pulses. No carotid bruits.  Abdomen: Bowel sounds positive, soft.no tenderness, no masses palpated. No hepatosplenomegaly.   Musculoskeletal: no clubbing / cyanosis. Good ROM, no contractures. Normal muscle tone.  Skin: small hyperpigmented macules on dorsal torso. Neurologic: CN 2-12 grossly intact. Sensation intact, DTR normal. Strength 5/5 in all 4.  Psychiatric: Normal judgment and insight. Alert and oriented x 4. Normal mood.      Labs on Admission: I have personally reviewed following labs and imaging studies  CBC:  Recent Labs Lab 02/26/16 1658  WBC 8.5  HGB 12.3  HCT 37.6  MCV 98.9  PLT [id number]   Basic Metabolic Panel:  Recent Labs Lab 02/26/16 1658  NA 137  K 4.1  CL 101  CO2 28  GLUCOSE 83  BUN 11  CREATININE 0.80  CALCIUM 9.6   GFR: Estimated Creatinine Clearance: 60 mL/min (by C-G formula based on SCr of 0.8 mg/dL).  Cardiac Enzymes:  Recent Labs Lab 02/26/16 1242  TROPONINI 0.15*    Urine analysis:    Component Value Date/Time   COLORURINE YELLOW 09/28/2014 [place] 09/28/2014 1038   LABSPEC 1.019 09/28/2014 1038   PHURINE 6.5 09/28/2014 1038   GLUCOSEU NEGATIVE 09/28/2014 1038   HGBUR NEGATIVE 09/28/2014 1038   BILIRUBINUR NEGATIVE 09/28/2014 1038   KETONESUR NEGATIVE 09/28/2014 1038   PROTEINUR NEGATIVE 09/28/2014 1038   UROBILINOGEN 1.0 09/28/2014 1038   NITRITE NEGATIVE 09/28/2014 1038   LEUKOCYTESUR NEGATIVE 09/28/2014 1038    Radiological Exams on Admission: Dg Chest Portable 1 View  Result Date:  02/26/2016 CLINICAL DATA:  Elevated troponins.  Left-sided neck pain. EXAM: PORTABLE CHEST 1 VIEW COMPARISON:  08/20/2016 FINDINGS: The heart size and mediastinal contours are within normal limits. Both lungs are clear. The visualized skeletal structures are unremarkable. IMPRESSION: No active disease. Electronically Signed   By: Kerby Moors M.D.   On: 02/26/2016 16:52    EKG: Independently reviewed. Vent. rate 72 BPM PR interval * ms QRS duration 157 ms QT/QTc 467/512 ms P-R-T axes 70 12 27 Sinus rhythm Short PR interval Right bundle branch block nonspecific st changes inferolateral leads Compared to previous tracing 09/28/14  Assessment/Plan  Principal Problem:   Chest pain   Elevated troponin Admit to telemetry/observation. Supplemental oxygen as needed. Start daily 81 mg aspirin.  Nitroglycerin as needed for chest pain. Continue troponin levels trending. Check echocardiogram in the morning. Will need stress testing as screening or outpatient.  Active Problems:   History of MVP (mitral valve prolapse) Check echocardiogram in the morning.    Hyperlipidemia Not on antihyperlipidemic drugs.. Continue lifestyle modifications. Check fasting lipid profile in the morning.    Intermittent asthma without complication Albuterol MDI as needed. Supplemental oxygen as needed.    Osteoarthritis Continue analgesics as needed.     DVT prophylaxis: Lovenox SQ. Code Status: full code. Family Communication: her daughter Thayer Headings was present in the room. Disposition Plan: Admit to telemetry for troponin level trending and echocardiogram. Consults called:  Admission status: Observation/telemetry.   Reubin Milan MD [facility] Pager [phone].  If 7PM-7AM, please contact night-coverage [email or web address] Password [facility]  02/26/2016, 7:46 PM

## 2016-02-26 NOTE — ED Provider Notes (Signed)
[facility] DEPT Provider Note   CSN: [id number] Arrival date & time: 02/26/16  1144     History   Chief Complaint Chief Complaint  Patient presents with  . Neck Pain  . Abnormal ECG    HPI Isabella Valencia is a healthy 75 y.o. female  referred to the ED from urgent care an abnormal EKG and episode of chest tightness on Tuesday.  She complains of neck pain and spasm.  On Tuesday, she was feeling some mild anorexia and malaise and then noticed neck pain and chest tightness.   No pleuritic pain or dyspnea, took an aspirin and chest sensation resolved within an hour.  However, the neck pain has persisted for the past 5 days and limits her neck movement.  No shooting pain down arms or legs, no numbness or weakness, no change in bowel habits.  Radiates to back of her head.  Worse with movement, no pain at rest with head in neutral position.  No known injuries; was playing with grandchildren the day before onset.  Went to [facility] care today.  EKG was performed there which showed new inferolateral ST depressions and pre-existing RBBB, and she was directed to the ED.  Given full dose aspirin.  No exertional chest discomfort, dyspnea, or reduced exercise tolerance.  Documented history of ?familial HL? in EHR, which patient denies.  HPI  Past Medical History:  Diagnosis Date  . Arthritis 2004   neg ANA & RA  . Asthma    minimal problems  . Complication of anesthesia    slow to wake up  . Difficulty sleeping    due to knee pain  . History of skin cancer   . Hyperlipidemia   . MVP (mitral valve prolapse)     Patient Active Problem List   Diagnosis Date Noted  . Overweight (BMI 25.0-29.9) 10/08/2014  . 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 S/P knee replacement 10/06/2014  . History of anesthesia reaction 08/20/2014  . RBBB (right bundle branch block) 05/13/2014  . Hyperlipidemia 11/04/2008  . DRY EYE SYNDROME 11/04/2008  . Intermittent asthma without complication [id number]  .  Osteoarthritis 11/04/2008    Past Surgical History:  Procedure Laterality Date  . ABDOMINAL HYSTERECTOMY    . CATARACT EXTRACTION    . COLONOSCOPY     every 5 years ; Dr Earlean Shawl  . cosmetic eye surgery    . ELBOW SURGERY      X3  . KNEE ARTHROSCOPY     rt knee  . TOTAL KNEE ARTHROPLASTY Right 10/06/2014   Procedure: RIGHT TOTAL KNEE ARTHROPLASTY;  Surgeon: Paralee Cancel, MD;  Location: [facility];  Service: Orthopedics;  Laterality: Right;  . TUBAL LIGATION      OB History    No data available       Home Medications    Prior to Admission medications   Medication Sig Start Date End Date Taking? Authorizing Provider  CALCIUM PO Take by mouth daily.    Historical Provider, MD  Multiple Vitamin (ONE-A-DAY ESSENTIAL) TABS Take 1 tablet by mouth every morning.     Historical Provider, MD    Family History Family History  Problem Relation Age of Onset  . Arthritis Mother   . Colon cancer Mother   . Stroke Father   . Colon cancer Maternal Aunt   . Colon cancer Maternal Grandmother   . Colon polyps Brother     Social History Social History  Substance Use Topics  . Smoking status:  Never Smoker  . Smokeless tobacco: Never Used  . Alcohol use 0.0 oz/week     Comment:  wine socially     Allergies   Triamcinolone acetonide   Review of Systems Review of Systems  Constitutional: Negative for fever.  Respiratory: Positive for chest tightness. Negative for cough and shortness of breath.   Cardiovascular: Negative for chest pain, palpitations and leg swelling.  Gastrointestinal: Negative for abdominal distention, constipation, diarrhea, nausea and vomiting.  Genitourinary: Negative for dysuria and hematuria.  Musculoskeletal: Positive for neck pain and neck stiffness.  Neurological: Positive for headaches. Negative for dizziness, weakness and numbness.     Physical Exam Updated Vital Signs BP 135/86 (BP Location: Right Arm)   Pulse 63   Temp 98.1 F (36.7 C) (Oral)    Resp 11   Ht 5\' 4"  (1.626 m)   Wt 74.4 kg   SpO2 99%   BMI 28.15 kg/m   Physical Exam  Constitutional: She is oriented to person, place, and time. She appears well-developed and well-nourished. No distress.  HENT:  Head: Normocephalic and atraumatic.  Eyes: Conjunctivae are normal. No scleral icterus.  Neck:  Neck ROM significantly limited by pain to flexion, extension, bilateral rotation, and bilateral lateral flexion. No tenderness to palpation of anterior, lateral, or posterior neck. Bilateral trapezius with increased tone.  Cardiovascular: Normal rate and regular rhythm.   No murmur heard. S4 gallop  Pulmonary/Chest: Effort normal and breath sounds normal.  Abdominal: Soft. She exhibits no distension. There is no tenderness.  Musculoskeletal: She exhibits no edema or tenderness.  Normal active range of motion in bilateral UEs.  Neurological: She is alert and oriented to person, place, and time.  Strength and sensation intact in bilateral UE  Skin: Skin is warm and dry.  Psychiatric: She has a normal mood and affect. Her behavior is normal.     ED Treatments / Results  Labs (all labs ordered are listed, but only abnormal results are displayed) Labs Reviewed - No data to display  EKG  EKG Interpretation  Date/Time:  Saturday February 26 2016 11:46:43 EDT Ventricular Rate:  72 PR Interval:    QRS Duration: 157 QT Interval:  467 QTC Calculation: 512 R Axis:   12 Text Interpretation:  Sinus rhythm Short PR interval Right bundle branch block nonspecific st changes inferolateral leads Compared to previous tracing 09/28/14 Confirmed by RAY MD, Andee Poles [id number]) on 02/26/2016 11:53:00 AM       Radiology No results found.  Procedures Procedures (including critical care time)  Medications Ordered in ED Medications - No data to display   Initial Impression / Assessment and Plan / ED Course  I have reviewed the triage vital signs and the nursing notes.  Pertinent labs  & imaging results that were available during my care of the patient were reviewed by me and considered in my medical decision making (see chart for details).  Low concern for ACS, but with new, non-specific ST changes and an episode of chest tightness will check troponins.  Neck pain likely cervical strain/spasm.  No medicines to provoke dystonic reactions.  No symptoms of radiculopathy or cord compression. -Troponins -Repeat EKG if symptoms change -Flexeril for neck spasm  Clinical Course  Comment By Time  Pt reports worsening headache.  No chest pain or dyspnea.  Percocet for head/neck pain. Minus Liberty, MD 08/26 1350  Elevated Troponin, but delay in sample processing outside time window for POC.  Sample will be sent to main lab for Troponin.  Minus Liberty, MD 08/26 1401  Troponin elevated from main lab.  Repeat with new sample. Minus Liberty, MD 08/26 1505  Repeat Trop elevated. Minus Liberty, MD 08/26 [phone]  Accepted by Hospitalist Dr Tennis Must. Minus Liberty, MD 08/26 Vernelle Emerald    Final Clinical Impressions(s) / ED Diagnoses   Final diagnoses:  Elevated troponin  Abnormal EKG   Elevated troponins and abnormal EKG concerning for NSTEMI.  Admit for ACS workup.  New Prescriptions New Prescriptions   No medications on file     Minus Liberty, MD 02/26/16 [id number]    Pattricia Boss, MD 02/27/16 1039

## 2016-02-26 NOTE — ED Notes (Signed)
Mini lab tech concerned results of istat may not be accurate due to length of time prior to testing the tube. Notified MD, verification sent to main lab.

## 2016-02-27 ENCOUNTER — Encounter (HOSPITAL_COMMUNITY): Payer: Self-pay | Admitting: Cardiology

## 2016-02-27 ENCOUNTER — Observation Stay (HOSPITAL_COMMUNITY): Payer: Medicare Other

## 2016-02-27 DIAGNOSIS — R079 Chest pain, unspecified: Secondary | ICD-10-CM | POA: Diagnosis not present

## 2016-02-27 DIAGNOSIS — R7989 Other specified abnormal findings of blood chemistry: Secondary | ICD-10-CM | POA: Diagnosis not present

## 2016-02-27 DIAGNOSIS — M542 Cervicalgia: Secondary | ICD-10-CM

## 2016-02-27 DIAGNOSIS — R0789 Other chest pain: Secondary | ICD-10-CM | POA: Diagnosis not present

## 2016-02-27 DIAGNOSIS — E785 Hyperlipidemia, unspecified: Secondary | ICD-10-CM | POA: Diagnosis not present

## 2016-02-27 DIAGNOSIS — M47812 Spondylosis without myelopathy or radiculopathy, cervical region: Secondary | ICD-10-CM | POA: Diagnosis not present

## 2016-02-27 LAB — COMPREHENSIVE METABOLIC PANEL
ALK PHOS: 72 U/L (ref 38–126)
ALT: 11 U/L — ABNORMAL LOW (ref 14–54)
ANION GAP: 9 (ref 5–15)
AST: 18 U/L (ref 15–41)
Albumin: 3.4 g/dL — ABNORMAL LOW (ref 3.5–5.0)
BUN: 11 mg/dL (ref 6–20)
CALCIUM: 9.1 mg/dL (ref 8.9–10.3)
CO2: 27 mmol/L (ref 22–32)
Chloride: 100 mmol/L — ABNORMAL LOW (ref 101–111)
Creatinine, Ser: 0.71 mg/dL (ref 0.44–1.00)
GFR calc non Af Amer: >60 mL/min (ref >60)
Glucose, Bld: 99 mg/dL (ref 65–99)
Potassium: 3.8 mmol/L (ref 3.5–5.1)
SODIUM: 136 mmol/L (ref 135–145)
Total Bilirubin: 0.6 mg/dL (ref 0.3–1.2)
Total Protein: 6.4 g/dL — ABNORMAL LOW (ref 6.5–8.1)

## 2016-02-27 LAB — TROPONIN I
TROPONIN I: 0.14 ng/mL — AB (ref <0.03)
TROPONIN I: 0.16 ng/mL — AB (ref <0.03)
Troponin I: 0.11 ng/mL (ref <0.03)

## 2016-02-27 MED ORDER — CYCLOBENZAPRINE HCL 10 MG PO TABS
5.0000 mg | ORAL_TABLET | Freq: Three times a day (TID) | ORAL | Status: DC | PRN
  Administered 2016-02-27 – 2016-02-28 (×3): 5 mg via ORAL
  Filled 2016-02-27 (×3): qty 1

## 2016-02-27 NOTE — Progress Notes (Addendum)
PROGRESS NOTE  REALYNN Valencia [id number] DOB: 02/17/1941 DOA: 02/26/2016 PCP: Binnie Rail, MD  HPI/Recap of past 24 hours:  Currently chest pain free, but did report chest pain that resolved with asa Currently c/o left sided neck pain with movement  Assessment/Plan: Principal Problem:   Chest pain Active Problems:   Hyperlipidemia   Intermittent asthma without complication   Osteoarthritis   Elevated troponin   MVP (mitral valve prolapse)  Chest pain with troponin elevation and EKG changes, lipid panel pending, echo pending, on asa, currently chest pain free, cardiology consulted, input appreciated  Neck pain, report no pain at rest, but pain with turning her head or extend her neck, cervical x ray with + degenerative changes, no acute findings, MRI neck pending, on flexeril.   Code Status: full  Family Communication: patient   Disposition Plan: home in 1-2 days with cardiology clearance   Consultants:  cardiology  Procedures:  none  Antibiotics:  none   Objective: BP 124/69 (BP Location: Left Arm)   Pulse 76   Temp 98.4 F (36.9 C) (Oral)   Resp 14   Ht 5\' 4"  (1.626 m)   Wt 73.9 kg (163 lb)   SpO2 97%   BMI 27.98 kg/m   Intake/Output Summary (Last 24 hours) at 02/27/16 0903 Last data filed at 02/27/16 0433  Gross per 24 hour  Intake              300 ml  Output                0 ml  Net              300 ml   Filed Weights   02/26/16 1148 02/26/16 2152 02/27/16 0432  Weight: 74.4 kg (164 lb) 74.2 kg (163 lb 8 oz) 73.9 kg (163 lb)    Exam:   General:  NAD, neck pain with neck extension, no tenderness  Cardiovascular: RRR  Respiratory: CTABL  Abdomen: Soft/ND/NT, positive BS  Musculoskeletal: No Edema  Neuro: aaox3  Data Reviewed: Basic Metabolic Panel:  Recent Labs Lab 02/26/16 1658 02/26/16 1956 02/27/16 0515  NA 137  --  136  K 4.1  --  3.8  CL 101  --  100*  CO2 28  --  27  GLUCOSE 83  --  99  BUN 11  --  11    CREATININE 0.80  --  0.71  CALCIUM 9.6  --  9.1  MG  --  1.8  --    Liver Function Tests:  Recent Labs Lab 02/27/16 0515  AST 18  ALT 11*  ALKPHOS 72  BILITOT 0.6  PROT 6.4*  ALBUMIN 3.4*   No results for input(s): LIPASE, AMYLASE in the last 168 hours. No results for input(s): AMMONIA in the last 168 hours. CBC:  Recent Labs Lab 02/26/16 1658  WBC 8.5  HGB 12.3  HCT 37.6  MCV 98.9  PLT 222   Cardiac Enzymes:    Recent Labs Lab 02/26/16 1242 02/26/16 1956 02/27/16 0009 02/27/16 0515  TROPONINI 0.15* 0.18* 0.16* 0.14*   BNP (last 3 results) No results for input(s): BNP in the last 8760 hours.  ProBNP (last 3 results) No results for input(s): PROBNP in the last 8760 hours.  CBG: No results for input(s): GLUCAP in the last 168 hours.  No results found for this or any previous visit (from the past 240 hour(s)).   Studies: Dg Cervical Spine 2 Or 3 Views  Result Date: 02/26/2016 CLINICAL DATA:  Neck pain since Tuesday.  Now headache for 2 days. EXAM: CERVICAL SPINE - 2-3 VIEW COMPARISON:  None. FINDINGS: Degenerative changes with disc space narrowing and endplate hypertrophic changes most prominent at C3-4, C5-6, and C6-7 levels. Slight anterior subluxation of C4 on C5 is likely degenerative. No vertebral compression deformities. No prevertebral soft tissue swelling. C1-2 articulation appears intact. Degenerative changes in the cervical facet joints. IMPRESSION: Degenerative changes in the cervical spine. No acute displaced fractures identified. Electronically Signed   By: Lucienne Capers M.D.   On: 02/26/2016 21:11   Dg Chest Portable 1 View  Result Date: 02/26/2016 CLINICAL DATA:  Elevated troponins.  Left-sided neck pain. EXAM: PORTABLE CHEST 1 VIEW COMPARISON:  08/20/2016 FINDINGS: The heart size and mediastinal contours are within normal limits. Both lungs are clear. The visualized skeletal structures are unremarkable. IMPRESSION: No active disease.  Electronically Signed   By: Kerby Moors M.D.   On: 02/26/2016 16:52    Scheduled Meds: . aspirin EC  81 mg Oral Daily  . enoxaparin (LOVENOX) injection  40 mg Subcutaneous Q24H  . famotidine  20 mg Oral BID  . sodium chloride flush  3 mL Intravenous Q12H    Continuous Infusions:    Time spent: 71mins  Jaelyn Cloninger MD, PhD  [facility] Pager [phone]. If 7PM-7AM, please contact night-coverage at [email or web address], password [facility] 02/27/2016, 9:03 AM  LOS: 0 days

## 2016-02-27 NOTE — Consult Note (Signed)
Cardiology Consult Note  Admit date: 02/26/2016 Name: Isabella Valencia 75 y.o.  female DOB:  09/30/1940 MRN:  [id number]  Today's date:  02/27/2016  Referring Physician:    Internal medicine service  Primary Physician:   Dr. Billey Gosling  Reason for Consultation:    Abnormal EKG  IMPRESSIONS: 1.  Predominant symptom is neck pain that sounds like a cervical disc issue.  I do not think the neck pain is a cardiac issue 2.  Abnormal EKG with right bundle branch block and new ST depression from several years ago 3.  Hyperlipidemia 4.  History of asthma 5.  Elevation of troponin at a low level which is nonspecific-clinical story does not suggest an infarct  RECOMMENDATION: 1.  I would go ahead and get a myocardial perfusion scan with risk stratification-she does have previous breast implants that could cause some shadowing  2.  MRI of neck and treatment of neck symptoms 3.  Obtain lipid panel  HISTORY: This very nice 75 year old female is seen for evaluation of an abnormal EKG and abnormal troponins.  The patient previously has been in good health.  She has no history of hypertension, family history, diabetes, or other significant medical issues.  She was playing with grandkids last Tuesday and developed vague sensation of tightness and took aspirin.  She later ate 2 pizzas that she had  cooked in the oven and laid down.  She woke that night with very severe neck pain that is persistent since then.  The discomfort is described in her posterior neck and it hurts her to lay her head on the pillow.  It radiates up into the top part of her skull.  She has not had any recurrence of chest pain.  She took medicines that she had at home and when these did not help she was seen yesterday at an urgent care.  While there the did an EKG and advised her to come to the emergency room by ambulance.  Troponins have been low-level positive since then.  She has complained of persistent neck pain since admission but does  not have any S tightness or other symptoms suggestive of angina.  Prior to the episode last Tuesday she has been able to do housework, go up and down stairs and do activities without any cardiac type symptoms.  Troponins have been positive at a low level elevation but have been flat and did not exhibit atypical rise or fall.  EKG showed right bundle branch block with ST depression in the inferolateral leads.  Comparison to old EKGs did show worsening of the ST depression compared to one several years ago.  She normally denies PND, orthopnea or claudication.  She is currently complaining of neck pain at the present time.  Past Medical History:  Diagnosis Date  . Arthritis 2004   neg ANA & RA  . Asthma    minimal problems  . Cervical disc disease   . Difficulty sleeping    due to knee pain  . History of skin cancer   . Hyperlipidemia   . MVP (mitral valve prolapse)       Past Surgical History:  Procedure Laterality Date  . ABDOMINAL HYSTERECTOMY    . BREAST ENHANCEMENT SURGERY Bilateral   . CATARACT EXTRACTION    . COLONOSCOPY     every 5 years ; Dr Earlean Shawl  . cosmetic eye surgery    . ELBOW SURGERY      X3  . KNEE ARTHROSCOPY  rt knee  . TOTAL KNEE ARTHROPLASTY Right 10/06/2014   Procedure: RIGHT TOTAL KNEE ARTHROPLASTY;  Surgeon: Paralee Cancel, MD;  Location: [facility];  Service: Orthopedics;  Laterality: Right;  . TUBAL LIGATION       Allergies:  is allergic to triamcinolone acetonide and adhesive [tape].   Medications: Prior to Admission medications   Medication Sig Start Date End Date Taking? Authorizing Provider  Calcium Citrate-Vitamin D (CALCIUM + D PO) Take 1 tablet by mouth daily.   Yes Historical Provider, MD  Multiple Vitamin (ONE-A-DAY ESSENTIAL) TABS Take 1 tablet by mouth every morning.    Yes Historical Provider, MD    Family History: Family Status  Relation Status  . Mother   . Father   . Maternal Aunt   . Maternal Grandmother   . Brother     Social  History:   reports that she has never smoked. She has never used smokeless tobacco. She reports that she drinks alcohol. She reports that she does not use drugs.   Social History   Social History Narrative   No regular exercise    Review of Systems: She has had previous cataract surgery.  She has had previous knee arthroplasty for significant osteoarthritis of the knees.  Normally no GI symptoms.  No urinary symptoms.  No recent stresses or depression.  Has gained some weight recently.  Other than as noted above the remainder of the review of systems is unremarkable.  Physical Exam: BP 124/69 (BP Location: Left Arm)   Pulse 76   Temp 98.4 F (36.9 C) (Oral)   Resp 14   Ht 5\' 4"  (1.626 m)   Wt 73.9 kg (163 lb)   SpO2 97%   BMI 27.98 kg/m   General appearance: Pleasant talkative mildly obese white female in no acute distress Head: Normocephalic, without obvious abnormality, atraumatic Eyes: conjunctivae/corneas clear. PERRL, EOM's intact. Fundi benign. Neck: no adenopathy, no carotid bruit, no JVD, supple, symmetrical, trachea midline and Her neck is somewhat stiff and immobile Lungs: clear to auscultation bilaterally Heart: regular rate and rhythm, S1, S2 normal, no murmur, click, rub or gallop Abdomen: soft, non-tender; bowel sounds normal; no masses,  no organomegaly Pelvic: deferred Extremities: extremities normal, atraumatic, no cyanosis or edema and varicose veins noted Pulses: 2+ and symmetric Skin: Skin color, texture, turgor normal. No rashes or lesions Neurologic: Grossly normal   Labs: CBC  Recent Labs  02/26/16 1658  WBC 8.5  RBC 3.80*  HGB 12.3  HCT 37.6  PLT 222  MCV 98.9  MCH 32.4  MCHC 32.7  RDW 13.3   CMP   Recent Labs  02/27/16 0515  NA 136  K 3.8  CL 100*  CO2 27  GLUCOSE 99  BUN 11  CREATININE 0.71  CALCIUM 9.1  PROT 6.4*  ALBUMIN 3.4*  AST 18  ALT 11*  ALKPHOS 72  BILITOT 0.6  GFRNONAA >60  GFRAA >60   Cardiac Panel (last  3 results) Troponin (Point of Care Test)  Recent Labs  02/26/16 1554  TROPIPOC 0.14*   Cardiac Panel (last 3 results)  Recent Labs  02/27/16 0009 02/27/16 0515 02/27/16 1038  TROPONINI 0.16* 0.14* 0.11*     Radiology:  Chest x-ray shows no active disease, cervical spine shows degenerative changes   EKG: Right bundle branch block, ST depression in the inferolateral leads at rest  Signed:  W. Doristine Church MD [facility]   Cardiology Consultant  02/27/2016, 1:01 PM

## 2016-02-28 ENCOUNTER — Other Ambulatory Visit (HOSPITAL_COMMUNITY): Payer: Medicare Other

## 2016-02-28 ENCOUNTER — Observation Stay (HOSPITAL_COMMUNITY): Payer: Medicare Other

## 2016-02-28 ENCOUNTER — Observation Stay (HOSPITAL_BASED_OUTPATIENT_CLINIC_OR_DEPARTMENT_OTHER): Payer: Medicare Other

## 2016-02-28 DIAGNOSIS — E785 Hyperlipidemia, unspecified: Secondary | ICD-10-CM

## 2016-02-28 DIAGNOSIS — R9431 Abnormal electrocardiogram [ECG] [EKG]: Secondary | ICD-10-CM | POA: Diagnosis not present

## 2016-02-28 DIAGNOSIS — M47812 Spondylosis without myelopathy or radiculopathy, cervical region: Secondary | ICD-10-CM

## 2016-02-28 DIAGNOSIS — R7989 Other specified abnormal findings of blood chemistry: Secondary | ICD-10-CM | POA: Diagnosis not present

## 2016-02-28 DIAGNOSIS — R079 Chest pain, unspecified: Secondary | ICD-10-CM | POA: Diagnosis not present

## 2016-02-28 LAB — BASIC METABOLIC PANEL
Anion gap: 8 (ref 5–15)
BUN: 10 mg/dL (ref 6–20)
CALCIUM: 9.1 mg/dL (ref 8.9–10.3)
CO2: 28 mmol/L (ref 22–32)
CREATININE: 0.72 mg/dL (ref 0.44–1.00)
Chloride: 99 mmol/L — ABNORMAL LOW (ref 101–111)
GFR calc non Af Amer: >60 mL/min (ref >60)
Glucose, Bld: 101 mg/dL — ABNORMAL HIGH (ref 65–99)
Potassium: 3.9 mmol/L (ref 3.5–5.1)
SODIUM: 135 mmol/L (ref 135–145)

## 2016-02-28 LAB — LIPID PANEL
CHOL/HDL RATIO: 2.8 ratio
Cholesterol: 207 mg/dL — ABNORMAL HIGH (ref 0–200)
HDL: 75 mg/dL (ref >40)
LDL CALC: 117 mg/dL — AB (ref 0–99)
Triglycerides: 77 mg/dL (ref <150)
VLDL: 15 mg/dL (ref 0–40)

## 2016-02-28 LAB — MAGNESIUM: Magnesium: 1.9 mg/dL (ref 1.7–2.4)

## 2016-02-28 LAB — CBC
HEMATOCRIT: 34.6 % — AB (ref 36.0–46.0)
Hemoglobin: 11.2 g/dL — ABNORMAL LOW (ref 12.0–15.0)
MCH: 31.9 pg (ref 26.0–34.0)
MCHC: 32.4 g/dL (ref 30.0–36.0)
MCV: 98.6 fL (ref 78.0–100.0)
Platelets: 273 10*3/uL (ref 150–400)
RBC: 3.51 MIL/uL — ABNORMAL LOW (ref 3.87–5.11)
RDW: 13.2 % (ref 11.5–15.5)
WBC: 7.8 10*3/uL (ref 4.0–10.5)

## 2016-02-28 LAB — NM MYOCAR MULTI W/SPECT W/WALL MOTION / EF
CHL CUP NUCLEAR SRS: 4
CHL CUP NUCLEAR SSS: 6
CSEPED: 5 min
CSEPEW: 1 METS
CSEPPHR: 131 {beats}/min
Exercise duration (sec): 1 s
LV dias vol: 68 mL (ref 46–106)
LV sys vol: 27 mL
NUC STRESS TID: 1.21
RATE: 0.1
Rest HR: 69 {beats}/min
SDS: 2

## 2016-02-28 LAB — TSH: TSH: 2.907 u[IU]/mL (ref 0.350–4.500)

## 2016-02-28 MED ORDER — TECHNETIUM TC 99M TETROFOSMIN IV KIT
10.0000 | PACK | Freq: Once | INTRAVENOUS | Status: AC | PRN
  Administered 2016-02-28: 10 via INTRAVENOUS

## 2016-02-28 MED ORDER — CYCLOBENZAPRINE HCL 5 MG PO TABS: 5.0000 mg | ORAL_TABLET | Freq: Three times a day (TID) | ORAL | 0 refills | Status: DC | PRN

## 2016-02-28 MED ORDER — ATORVASTATIN CALCIUM 20 MG PO TABS: 20.0000 mg | ORAL_TABLET | Freq: Every day | ORAL | Status: DC

## 2016-02-28 MED ORDER — REGADENOSON 0.4 MG/5ML IV SOLN
0.4000 mg | Freq: Once | INTRAVENOUS | Status: AC
  Administered 2016-02-28: 0.4 mg via INTRAVENOUS
  Filled 2016-02-28: qty 5

## 2016-02-28 MED ORDER — REGADENOSON 0.4 MG/5ML IV SOLN
INTRAVENOUS | Status: AC
  Filled 2016-02-28: qty 5

## 2016-02-28 MED ORDER — ATORVASTATIN CALCIUM 20 MG PO TABS: 20.0000 mg | ORAL_TABLET | Freq: Every day | ORAL | 0 refills | Status: DC

## 2016-02-28 MED ORDER — TECHNETIUM TC 99M TETROFOSMIN IV KIT
30.0000 | PACK | Freq: Once | INTRAVENOUS | Status: AC | PRN
  Administered 2016-02-28: 30 via INTRAVENOUS

## 2016-02-28 NOTE — Discharge Summary (Addendum)
Discharge Summary  GUSTAVO GERVACIO [id number] DOB: 1940/07/30  PCP: Binnie Rail, MD  Admit date: 02/26/2016 Discharge date: 02/28/2016  Time spent: >33mins, more than 50% time spent on coordination of care.  Recommendations for Outpatient Follow-up:  1. F/u with PMD within a week  for hospital discharge follow up, repeat cbc/bmp at follow up  Discharge Diagnoses:  Active Hospital Problems   Diagnosis Date Noted  . Chest pain 02/26/2016  . Elevated troponin 02/26/2016  . MVP (mitral valve prolapse) 02/26/2016  . Hyperlipidemia 11/04/2008  . Osteoarthritis 11/04/2008  . Intermittent asthma without complication [id number]    Resolved Hospital Problems   Diagnosis Date Noted Date Resolved  No resolved problems to display.    Discharge Condition: stable  Diet recommendation: heart healthy  Filed Weights   02/26/16 2152 02/27/16 0432 02/28/16 0549  Weight: 74.2 kg (163 lb 8 oz) 73.9 kg (163 lb) 74.5 kg (164 lb 4.8 oz)    History of present illness:  Patient coming from: Home.  Chief Complaint: Chest pain.  HPI: Kensy E Kresse is a 75 y.o. female with medical history significant of Osteoarthritis, asthma,skin cancer, hyperlipidemia, mitral valve prolapse. In the emergency department with complaints of having an episode of chest pain earlier this week.  Per patient, around 2200 on Tuesday evening, she developed mild chest pressure. She subsequently was able to go to sleep, but states that she woke up around 0300 Wednesday morning with neck pain that radiated to her chest, producing and pressure-like discomfort but denies dyspnea, dizziness, palpitations, diaphoresis, orthopnea, PND or pitting edema lower extremities.   She states that she took some hydrocodone that she had available from previous surgery,  an aspirin and went back to sleep. Since then, she has been taking a daily aspirin using tramadol during the day time and hydrocodone at bedtime to avoid pain. However,  she states that her neck pain has worsened since Tuesday Socially he went to the urgent care facility in [place], where they performed an EKG which was abnormal and no old EKG was available for comparison.  They gave the patient  325 mg of aspirin and refer her to the emergency department.  ED Course: Work up in the emergency department shows abnormal EKG with ST segment/T wave abnormalities and mildly positive troponin levels. She is currently chest pain free and her neck pain is still bothering her, but is not too intense at the moment. However, she stated that she would like to have something for pain as needed.  Hospital Course:  Principal Problem:   Chest pain Active Problems:   Hyperlipidemia   Intermittent asthma without complication   Osteoarthritis   Elevated troponin   MVP (mitral valve prolapse)   Chest pain with mild troponin elevation and EKG with RBBB which is chronic, lipid panel LDL 117, HDL 75, total cholesterol 207, cardiac stress test low risk , echo pending,  chest pain free while in the hospital, patient does not want to take asa daily due to nose bleed with asa, cardiology Dr Wynonia Lawman consulted, input appreciated  HLD: started lipitor 20mg  po daily.  Neck pain, report no pain at rest, but pain with turning her head or extend her neck, cervical x ray with + degenerative changes, no acute findings, MRI neck + cervical spondylosis greatest at c5-6 level withmoderate left and mild right foraminal narrowing and mild canal stenosis with slight anterior cord impingement and flattening  , on flexeril. I have talked to neurosurgery on call Dr  Earnie Larsson who reviewed the imaging and recommend outpatient neurosurgery follow up.   Code Status: full  Family Communication: patient   Disposition Plan: home on 8/28   Consultants:  Cardiology Dr Wynonia Lawman  Neurosurgery Dr Annette Stable  Procedures:  Stress test  Antibiotics:  none   Discharge Exam: BP 107/72 (BP  Location: Left Arm)   Pulse 86   Temp 98.5 F (36.9 C) (Oral)   Resp 18   Ht 5\' 4"  (1.626 m)   Wt 74.5 kg (164 lb 4.8 oz)   SpO2 100%   BMI 28.20 kg/m     General:  NAD, neck pain with neck extension, no tenderness  Cardiovascular: RRR  Respiratory: CTABL  Abdomen: Soft/ND/NT, positive BS  Musculoskeletal: No Edema  Neuro: aaox3   Discharge Instructions You were cared for by a hospitalist during your hospital stay. If you have any questions about your discharge medications or the care you received while you were in the hospital after you are discharged, you can call the unit and asked to speak with the hospitalist on call if the hospitalist that took care of you is not available. Once you are discharged, your primary care physician will handle any further medical issues. Please note that NO REFILLS for any discharge medications will be authorized once you are discharged, as it is imperative that you return to your primary care physician (or establish a relationship with a primary care physician if you do not have one) for your aftercare needs so that they can reassess your need for medications and monitor your lab values.     Medication List    TAKE these medications   atorvastatin 20 MG tablet Commonly known as:  LIPITOR Take 1 tablet (20 mg total) by mouth daily at 6 PM.   CALCIUM + D PO Take 1 tablet by mouth daily.   cyclobenzaprine 5 MG tablet Commonly known as:  FLEXERIL Take 1 tablet (5 mg total) by mouth 3 (three) times daily as needed for muscle spasms.   ONE-A-DAY ESSENTIAL Tabs Take 1 tablet by mouth every morning.      Allergies  Allergen Reactions  . Triamcinolone Acetonide     Soft tissue atrophy with injection of Kenalog  . Adhesive [Tape] Rash    Tears skin, Please use "paper" tape   Follow-up Information    Binnie Rail, MD Follow up in 1 week(s).   Specialty:  Internal Medicine Why:  hospital discharge follow up Contact information: [address] [place] [address] [phone]        POOL,HENRY A, MD Follow up in 1 month(s).   Specialty:  Neurosurgery Why:  neck pain Contact information: [address] [place] [address] [phone]        Ezzard Standing, MD .   Specialty:  Cardiology Why:  as needed Contact information: [address] [place] [address] [phone]            The results of significant diagnostics from this hospitalization (including imaging, microbiology, ancillary and laboratory) are listed below for reference.    Significant Diagnostic Studies: Dg Cervical Spine 2 Or 3 Views  Result Date: 02/26/2016 CLINICAL DATA:  Neck pain since Tuesday.  Now headache for 2 days. EXAM: CERVICAL SPINE - 2-3 VIEW COMPARISON:  None. FINDINGS: Degenerative changes with disc space narrowing and endplate hypertrophic changes most prominent at C3-4, C5-6, and C6-7 levels. Slight anterior subluxation of C4 on C5 is  likely degenerative. No vertebral compression deformities. No prevertebral soft tissue swelling. C1-2 articulation appears intact. Degenerative changes in the cervical facet joints. IMPRESSION: Degenerative changes in the cervical spine. No acute displaced fractures identified. Electronically Signed   By: Lucienne Capers M.D.   On: 02/26/2016 21:11   Mr Cervical Spine Wo Contrast  Result Date: 02/27/2016 CLINICAL DATA:  75 y/o F; degenerative changes of the neck with pain and headache for 1 week EXAM: MRI CERVICAL SPINE WITHOUT CONTRAST TECHNIQUE: Multiplanar, multisequence MR imaging of the cervical spine was performed. No intravenous contrast was administered. COMPARISON:  Cervical radiographs 02/26/2016 FINDINGS: Alignment: Straightening of cervical lordosis with slight anterolisthesis of C4-5 and retrolisthesis at C5-6. Vertebrae: No evidence of fracture, diskitis, or suspicious osseous lesion. Severe endplate degenerative changes at C5-6 with mild  edema in the opposing vertebral bodies. T1/T2 subcentimeter hyperintense focus within the odontoid process may represent focal fat or small hemangioma. Cord: Normal signal and morphology. Posterior Fossa, vertebral arteries, paraspinal tissues: Negative. Disc levels: Multilevel disc desiccation and disc space narrowing severe at the C5-6 level. C2-3: Small central protrusion and right greater than left facet hypertrophy. No significant foraminal narrowing or canal stenosis. C3-4: No significant disc displacement, foraminal narrowing, or canal stenosis. Mild right greater than left facet hypertrophy. C4-5: Minimal anterolisthesis with uncovered disc and right greater than left uncovertebral/facet hypertrophy. Mild right foraminal narrowing. No significant canal stenosis. C5-6: Disc osteophyte complex and left-greater-than-right uncovertebral/facet hypertrophy. Moderate left and mild right foraminal narrowing. Mild canal stenosis with effacement of anterior thecal sac. Mild anterior cord flattening. C6-7: No significant disc displacement, foraminal narrowing, or canal stenosis. Bilateral facet hypertrophy. C7-T1: No significant disc displacement, foraminal narrowing, or canal stenosis. Bilateral facet hypertrophy. IMPRESSION: Cervical spondylosis greatest at the C5-6 level where there is moderate left and mild right foraminal narrowing and mild canal stenosis with slight anterior cord impingement and flattening. Electronically Signed   By: Kristine Garbe M.D.   On: 02/27/2016 17:08   Nm Myocar Multi W/spect W/wall Motion / Ef  Result Date: 02/28/2016  Horizontal ST segment depression ST segment depression of 1 mm was noted during stress in the II, aVF, V4 and V5 leads.  This is a low risk study. No ischemia identified  Nuclear stress EF: 60%.  Candee Furbish, MD   Dg Chest Portable 1 View  Result Date: 02/26/2016 CLINICAL DATA:  Elevated troponins.  Left-sided neck pain. EXAM: PORTABLE CHEST 1 VIEW  COMPARISON:  08/20/2016 FINDINGS: The heart size and mediastinal contours are within normal limits. Both lungs are clear. The visualized skeletal structures are unremarkable. IMPRESSION: No active disease. Electronically Signed   By: Kerby Moors M.D.   On: 02/26/2016 16:52    Microbiology: No results found for this or any previous visit (from the past 240 hour(s)).   Labs: Basic Metabolic Panel:  Recent Labs Lab 02/26/16 1658 02/26/16 1956 02/27/16 0515 02/28/16 0505  NA 137  --  136 135  K 4.1  --  3.8 3.9  CL 101  --  100* 99*  CO2 28  --  27 28  GLUCOSE 83  --  99 101*  BUN 11  --  11 10  CREATININE 0.80  --  0.71 0.72  CALCIUM 9.6  --  9.1 9.1  MG  --  1.8  --  1.9   Liver Function Tests:  Recent Labs Lab 02/27/16 0515  AST 18  ALT 11*  ALKPHOS 72  BILITOT 0.6  PROT 6.4*  ALBUMIN  3.4*   No results for input(s): LIPASE, AMYLASE in the last 168 hours. No results for input(s): AMMONIA in the last 168 hours. CBC:  Recent Labs Lab 02/26/16 1658 02/28/16 0505  WBC 8.5 7.8  HGB 12.3 11.2*  HCT 37.6 34.6*  MCV 98.9 98.6  PLT 222 273   Cardiac Enzymes:  Recent Labs Lab 02/26/16 1242 02/26/16 1956 02/27/16 0009 02/27/16 0515 02/27/16 1038  TROPONINI 0.15* 0.18* 0.16* 0.14* 0.11*   BNP: BNP (last 3 results) No results for input(s): BNP in the last 8760 hours.  ProBNP (last 3 results) No results for input(s): PROBNP in the last 8760 hours.  CBG: No results for input(s): GLUCAP in the last 168 hours.     SignedFlorencia Reasons MD, PhD  [facility] 02/28/2016, 3:46 PM

## 2016-02-28 NOTE — Progress Notes (Signed)
  Echocardiogram 2D Echocardiogram has been performed.  Isabella Valencia 02/28/2016, 2:16 PM

## 2016-02-28 NOTE — Progress Notes (Signed)
Subjective:  Neck pain persists today.  She had a particular difficult time holding her arms up above her head during the test.  The MRI showed significant cervical disc disease with some mild spinal cord compression at one level.  She thinks the neck pain may be be somewhat better after Flexeril.  Myocardial perfusion scan results still pending.  Objective:  Vital Signs in the last 24 hours: BP (!) 144/77   Pulse (!) 123   Temp 97.6 F (36.4 C) (Oral)   Resp 18   Ht 5\' 4"  (1.626 m)   Wt 74.5 kg (164 lb 4.8 oz)   SpO2 96%   BMI 28.20 kg/m   Physical Exam: Pleasant female currently in no acute distress Lungs:  Clear Cardiac:  Regular rhythm, normal S1 and S2, no S3 Extremities:  No edema present  Intake/Output from previous day: 08/27 0701 - 08/28 0700 In: 240 [P.O.:240] Out: 1000 [Urine:1000]  Weight Filed Weights   02/26/16 2152 02/27/16 0432 02/28/16 0549  Weight: 74.2 kg (163 lb 8 oz) 73.9 kg (163 lb) 74.5 kg (164 lb 4.8 oz)    Lab Results: Basic Metabolic Panel:  Recent Labs  02/27/16 0515 02/28/16 0505  NA 136 135  K 3.8 3.9  CL 100* 99*  CO2 27 28  GLUCOSE 99 101*  BUN 11 10  CREATININE 0.71 0.72   CBC:  Recent Labs  02/26/16 1658 02/28/16 0505  WBC 8.5 7.8  HGB 12.3 11.2*  HCT 37.6 34.6*  MCV 98.9 98.6  PLT 222 273   Cardiac Enzymes: Troponin (Point of Care Test)  Recent Labs  02/26/16 1554  TROPIPOC 0.14*   Cardiac Panel (last 3 results)  Recent Labs  02/27/16 0009 02/27/16 0515 02/27/16 1038  TROPONINI 0.16* 0.14* 0.11*    Telemetry: Sinus rhythm  Assessment/Plan:  1.  Prolonged neck pain for 5 days likely due to cervical disc disease 2.  Abnormal EKG with ST depression and right bundle branch block-myocardial perfusion scan is pending 3.  Nonspecific elevation of troponin which has been flat   Recommendations:  Treatment of cervical disc disease per primary team.  Awaiting results of myocardial perfusion scan.  If  this is low risk she can be discharged home.  I would have her follow-up with her primary care doctor and if needed I would be happy to follow her from a cardiac viewpoint.     Kerry Hough  MD [facility]  02/28/2016, 1:48 PM

## 2016-02-28 NOTE — Progress Notes (Signed)
Patient presented for Lexiscan. Tolerated procedure well. Baseline complain of neck pain and headache. Pending final stress imaging result.

## 2016-02-29 ENCOUNTER — Telehealth: Payer: Self-pay | Admitting: *Deleted

## 2016-02-29 ENCOUNTER — Telehealth: Payer: Self-pay | Admitting: Internal Medicine

## 2016-02-29 NOTE — Telephone Encounter (Signed)
Patient Name: CAMPBELL Waage  DOB: 06-Dec-1940    Initial Comment Caller states she was discharged from the hospital yesterday. Neck pain, and a headache.    Nurse Assessment  Nurse: Thad Ranger RN, Langley Gauss Date/Time (Eastern Time): 02/29/2016 1:47:36 PM  Confirm and document reason for call. If symptomatic, describe symptoms. You must click the next button to save text entered. ---Caller states she was discharged from the hospital yesterday. Seen for Neck pain and a headache. Several tests ran, muscle relaxers ordered and cardiology evaluated her as well.  Has the patient traveled out of the country within the last 30 days? ---Not Applicable  Does the patient have any new or worsening symptoms? ---Yes  Will a triage be completed? ---Yes  Related visit to physician within the last 2 weeks? ---Yes  Does the PT have any chronic conditions? (i.e. diabetes, asthma, etc.) ---No  Is this a behavioral health or substance abuse call? ---No     Guidelines    Guideline Title Affirmed Question Affirmed Notes  Neck Pain or Stiffness [1] Age > 50 AND [2] no history of prior similar neck pain    Final Disposition User   See PCP within 2 Oda Kilts, RN, Langley Gauss    Comments  Appt made for 03/13/16 at 16 w/ Dr Billey Gosling. Pt aware/agreeable. OFFICE NURSE: PLEASE GET ER RECORDS FROM ER VISIT ON 02/28/16 FOR DR BURNS REVIEW. PT SEEN FOR HA AND NECK PAIN; ALSO R/O CARDIAC DISORDER. SHE WAS EVALUATED BY CARDIOLOGIST WHILE IN ER AS WELL. Thank you.   Referrals  REFERRED TO PCP OFFICE   Disagree/Comply: Comply

## 2016-02-29 NOTE — Telephone Encounter (Signed)
Called pt concerning TCM hosp f/u no answer LMOM RTC...Isabella Valencia

## 2016-03-01 LAB — ECHOCARDIOGRAM COMPLETE
HEIGHTINCHES: 64 in
WEIGHTICAEL: 2628.8 [oz_av]

## 2016-03-01 NOTE — Telephone Encounter (Signed)
Pt call back completed TCM call below  Transition Care Management Follow-up Telephone Call   Date discharged? 02/28/16   How have you been since you were released from the hospital? Pt states she is ok still have the nagging headache sxs, but not like it was before she went to the hospital.    Do you understand why you were in the hospital? YES   Do you understand the discharge instructions? YES  Where were you discharged to? Home  Items Reviewed:  Medications reviewed: YES  Allergies reviewed: YES  Dietary changes reviewed: YES  Referrals reviewed: YES, she will discuss w/Dr. Quay Burow to see if she need to see the neurosurgery   Functional Questionnaire:   Activities of Daily Living (ADLs):   She states she are independent in the following: ambulation, bathing and hygiene, feeding, continence, grooming, toileting and dressing States she  require assistance with the following: ambulation   Any transportation issues/concerns?: NO   Any patient concerns? NO   Confirmed importance and date/time of follow-up visits scheduled YES, appt made 03/13/16  Provider Appointment booked with Dr. Quay Burow  Confirmed with patient if condition begins to worsen call PCP or go to the ER.  Patient was given the office number and encouraged to call back with question or concerns.  : YES

## 2016-03-13 ENCOUNTER — Ambulatory Visit (INDEPENDENT_AMBULATORY_CARE_PROVIDER_SITE_OTHER): Payer: Medicare Other | Admitting: Internal Medicine

## 2016-03-13 ENCOUNTER — Encounter: Payer: Self-pay | Admitting: Internal Medicine

## 2016-03-13 VITALS — BP 138/92 | HR 94 | Temp 98.8°F | Resp 16 | Wt 164.0 lb

## 2016-03-13 DIAGNOSIS — R0789 Other chest pain: Secondary | ICD-10-CM | POA: Diagnosis not present

## 2016-03-13 DIAGNOSIS — M542 Cervicalgia: Secondary | ICD-10-CM

## 2016-03-13 NOTE — Assessment & Plan Note (Signed)
Workup in the hospital including echocardiogram, stress test, EKG and chest x-ray showed no cause of her chest pain Not cardiac in nature Resolved No further evaluation needed

## 2016-03-13 NOTE — Progress Notes (Signed)
Pre visit review using our clinic review tool, if applicable. No additional management support is needed unless otherwise documented below in the visit note. 

## 2016-03-13 NOTE — Patient Instructions (Addendum)
   Medications reviewed and updated.  No changes recommended at this time.     

## 2016-03-13 NOTE — Assessment & Plan Note (Signed)
Neck pain is likely muscular and possibly related to arthritis It was severe in nature causing headache, but that has improved the headache has resolved She still has Flexeril at home and will take that as needed Imaging in the hospital showed no significant concerns I do not feel follow-up with neurosurgery if needed at this time and she agrees We will just monitor symptoms If symptoms persist consider physical therapy

## 2016-03-13 NOTE — Progress Notes (Signed)
Subjective:    Patient ID: Isabella Valencia, female    DOB: January 23, 1941, 75 y.o.   MRN: [id number]  HPI She is here for follow up from her recent hospitalization.  She went to urgent care because she was experiencing neck pain and stiffness that ended up causing a headache that became more severe. She denies headaches in the past and was very concerned about the intensity of this headache and was concerned about the possibility of a brain tumor completely. She also had some chest discomforts the day prior. She went to urgent care and her EKG was abnormal. She was referred to the emergency room. Cardiac workup in the emergency room including EKG, blood work, chest x-ray and echocardiogram, stress test were all within normal limits.  She had an x-ray of her neck that showed degenerative changes and an MRI of her cervical spine that showed some arthritis and degenerative changes, but no concerning findings. Her symptoms have improved since being in the hospital. Cardiology was consulted and they did not feel there is any cardiac disease. Neurosurgery was consulted as well. He reviewed her imaging. They recommended follow-up as an outpatient if needed. She was discharged home with Flexeril.  She was driving yesterday and her neck was stiff and painful.  Today her neck is slightly stiff.  She has not taken any flexeril in the past couple of nights.  Her neck pain has significantly improved. She denies headaches for the past few days.  She denies any pain, numbness/tingling or weakness in her arms.  She denies any chest pain, palpitations, shortness of breath or fevers.  She has been under increased stress because her brother is ill.  Medications and allergies reviewed with patient and updated if appropriate.  Patient Active Problem List   Diagnosis Date Noted  . Elevated troponin 02/26/2016  . Chest pain 02/26/2016  . MVP (mitral valve prolapse) 02/26/2016  . Overweight (BMI 25.0-29.9) 10/08/2014    . 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 S/P knee replacement 10/06/2014  . History of anesthesia reaction 08/20/2014  . RBBB (right bundle branch block) 05/13/2014  . Hyperlipidemia 11/04/2008  . DRY EYE SYNDROME 11/04/2008  . Intermittent asthma without complication [id number]  . Osteoarthritis 11/04/2008    Current Outpatient Prescriptions on File Prior to Visit  Medication Sig Dispense Refill  . Calcium Citrate-Vitamin D (CALCIUM + D PO) Take 1 tablet by mouth daily.    . cyclobenzaprine (FLEXERIL) 5 MG tablet Take 1 tablet (5 mg total) by mouth 3 (three) times daily as needed for muscle spasms. 15 tablet 0  . Multiple Vitamin (ONE-A-DAY ESSENTIAL) TABS Take 1 tablet by mouth every morning.     Marland Kitchen atorvastatin (LIPITOR) 20 MG tablet Take 1 tablet (20 mg total) by mouth daily at 6 PM. (Patient not taking: Reported on 03/13/2016) 30 tablet 0   No current facility-administered medications on file prior to visit.     Past Medical History:  Diagnosis Date  . Arthritis 2004   neg ANA & RA  . Asthma    minimal problems  . Cervical disc disease   . Difficulty sleeping    due to knee pain  . History of skin cancer   . Hyperlipidemia   . MVP (mitral valve prolapse)     Past Surgical History:  Procedure Laterality Date  . ABDOMINAL HYSTERECTOMY    . BREAST ENHANCEMENT SURGERY Bilateral   . CATARACT EXTRACTION    . COLONOSCOPY     every  5 years ; Dr Earlean Shawl  . cosmetic eye surgery    . ELBOW SURGERY      X3  . KNEE ARTHROSCOPY     rt knee  . TOTAL KNEE ARTHROPLASTY Right 10/06/2014   Procedure: RIGHT TOTAL KNEE ARTHROPLASTY;  Surgeon: Paralee Cancel, MD;  Location: [facility];  Service: Orthopedics;  Laterality: Right;  . TUBAL LIGATION      Social History   Social History  . Marital status: Divorced    Spouse name: N/A  . Number of children: N/A  . Years of education: N/A   Social History Main Topics  . Smoking status: Never Smoker  . Smokeless tobacco: Never Used  . Alcohol use  0.0 oz/week     Comment:  wine socially  . Drug use: No  . Sexual activity: Not on file   Other Topics Concern  . Not on file   Social History Narrative   No regular exercise    Family History  Problem Relation Age of Onset  . Arthritis Mother   . Colon cancer Mother   . Stroke Father   . Colon cancer Maternal Aunt   . Colon cancer Maternal Grandmother   . Colon polyps Brother     Review of Systems  Constitutional: Negative for fever.  Respiratory: Negative for cough, shortness of breath and wheezing.   Cardiovascular: Negative for chest pain, palpitations and leg swelling.  Musculoskeletal: Positive for neck pain and neck stiffness. Negative for back pain.  Neurological: Negative for headaches.       Objective:   Vitals:   03/13/16 1118  BP: (!) 138/92  Pulse: 94  Resp: 16  Temp: 98.8 F (37.1 C)   Filed Weights   03/13/16 1118  Weight: 164 lb (74.4 kg)   Body mass index is 28.15 kg/m.   Physical Exam  Constitutional: She appears well-developed and well-nourished. No distress.  HENT:  Head: Normocephalic and atraumatic.  Cardiovascular: Normal rate, regular rhythm and normal heart sounds.   No murmur heard. Pulmonary/Chest: Effort normal and breath sounds normal. No respiratory distress. She has no wheezes. She has no rales.  Musculoskeletal: Normal range of motion. She exhibits no edema.  Normal range of motion of neck, mild tenderness lower cervical spine, no upper spine tenderness, no trapezius tenderness or upper back tenderness and muscular region  Neurological:   Upper extremities bilaterally  Skin: She is not diaphoretic.          Assessment & Plan:   See Problem List for Assessment and Plan of chronic medical problems.

## 2016-04-19 ENCOUNTER — Ambulatory Visit: Payer: Medicare Other | Admitting: Internal Medicine

## 2016-04-19 NOTE — Progress Notes (Deleted)
Subjective:    Patient ID: Isabella Valencia, female    DOB: [birth year], 75 y.o.   MRN: [id number]  HPI The patient is here for follow up.    Medications and allergies reviewed with patient and updated if appropriate.  Patient Active Problem List   Diagnosis Date Noted  . Neck pain 03/13/2016  . Elevated troponin 02/26/2016  . Chest pain 02/26/2016  . MVP (mitral valve prolapse) 02/26/2016  . Overweight (BMI 25.0-29.9) 10/08/2014  . 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 S/P knee replacement 10/06/2014  . History of anesthesia reaction 08/20/2014  . RBBB (right bundle branch block) 05/13/2014  . Hyperlipidemia 11/04/2008  . DRY EYE SYNDROME 11/04/2008  . Intermittent asthma without complication [id number]  . Osteoarthritis 11/04/2008    Current Outpatient Prescriptions on File Prior to Visit  Medication Sig Dispense Refill  . atorvastatin (LIPITOR) 20 MG tablet Take 1 tablet (20 mg total) by mouth daily at 6 PM. (Patient not taking: Reported on 03/13/2016) 30 tablet 0  . Calcium Citrate-Vitamin D (CALCIUM + D PO) Take 1 tablet by mouth daily.    . cyclobenzaprine (FLEXERIL) 5 MG tablet Take 1 tablet (5 mg total) by mouth 3 (three) times daily as needed for muscle spasms. 15 tablet 0  . Multiple Vitamin (ONE-A-DAY ESSENTIAL) TABS Take 1 tablet by mouth every morning.      No current facility-administered medications on file prior to visit.     Past Medical History:  Diagnosis Date  . Arthritis 2004   neg ANA & RA  . Asthma    minimal problems  . Cervical disc disease   . Difficulty sleeping    due to knee pain  . History of skin cancer   . Hyperlipidemia   . MVP (mitral valve prolapse)     Past Surgical History:  Procedure Laterality Date  . ABDOMINAL HYSTERECTOMY    . BREAST ENHANCEMENT SURGERY Bilateral   . CATARACT EXTRACTION    . COLONOSCOPY     every 5 years ; Dr Earlean Shawl  . cosmetic eye surgery    . ELBOW SURGERY      X3  . KNEE ARTHROSCOPY     rt knee  . TOTAL  KNEE ARTHROPLASTY Right 10/06/2014   Procedure: RIGHT TOTAL KNEE ARTHROPLASTY;  Surgeon: Paralee Cancel, MD;  Location: [facility];  Service: Orthopedics;  Laterality: Right;  . TUBAL LIGATION      Social History   Social History  . Marital status: Divorced    Spouse name: N/A  . Number of children: N/A  . Years of education: N/A   Social History Main Topics  . Smoking status: Never Smoker  . Smokeless tobacco: Never Used  . Alcohol use 0.0 oz/week     Comment:  wine socially  . Drug use: No  . Sexual activity: Not on file   Other Topics Concern  . Not on file   Social History Narrative   No regular exercise    Family History  Problem Relation Age of Onset  . Arthritis Mother   . Colon cancer Mother   . Stroke Father   . Colon cancer Maternal Aunt   . Colon cancer Maternal Grandmother   . Colon polyps Brother     Review of Systems     Objective:  There were no vitals filed for this visit. There were no vitals filed for this visit. There is no height or weight on file to calculate BMI.   Physical  Exam    Constitutional: Appears well-developed and well-nourished. No distress.  HENT:  Head: Normocephalic and atraumatic.  Neck: Neck supple. No tracheal deviation present. No thyromegaly present.  No cervical lymphadenopathy Cardiovascular: Normal rate, regular rhythm and normal heart sounds.   No murmur heard. No carotid bruit .  No edema Pulmonary/Chest: Effort normal and breath sounds normal. No respiratory distress. No has no wheezes. No rales.  Skin: Skin is warm and dry. Not diaphoretic.  Psychiatric: Normal mood and affect. Behavior is normal.      Assessment & Plan:    See Problem List for Assessment and Plan of chronic medical problems.

## 2016-05-09 ENCOUNTER — Ambulatory Visit (INDEPENDENT_AMBULATORY_CARE_PROVIDER_SITE_OTHER): Payer: Medicare Other

## 2016-05-09 DIAGNOSIS — Z23 Encounter for immunization: Secondary | ICD-10-CM | POA: Diagnosis not present

## 2017-02-21 ENCOUNTER — Ambulatory Visit: Payer: Medicare Other | Admitting: Internal Medicine

## 2017-02-21 NOTE — Progress Notes (Deleted)
Subjective:    Patient ID: Isabella Valencia, female    DOB: [birth year], 76 y.o.   MRN: [id number]  HPI   Medications and allergies reviewed with patient and updated if appropriate.  Patient Active Problem List   Diagnosis Date Noted  . Neck pain 03/13/2016  . Elevated troponin 02/26/2016  . Chest pain 02/26/2016  . MVP (mitral valve prolapse) 02/26/2016  . Overweight (BMI 25.0-29.9) 10/08/2014  . 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 S/P knee replacement 10/06/2014  . History of anesthesia reaction 08/20/2014  . RBBB (right bundle branch block) 05/13/2014  . Hyperlipidemia 11/04/2008  . DRY EYE SYNDROME 11/04/2008  . Intermittent asthma without complication 17/40/8144  . Osteoarthritis 11/04/2008    Current Outpatient Prescriptions on File Prior to Visit  Medication Sig Dispense Refill  . atorvastatin (LIPITOR) 20 MG tablet Take 1 tablet (20 mg total) by mouth daily at 6 PM. (Patient not taking: Reported on 03/13/2016) 30 tablet 0  . Calcium Citrate-Vitamin D (CALCIUM + D PO) Take 1 tablet by mouth daily.    . cyclobenzaprine (FLEXERIL) 5 MG tablet Take 1 tablet (5 mg total) by mouth 3 (three) times daily as needed for muscle spasms. 15 tablet 0  . Multiple Vitamin (ONE-A-DAY ESSENTIAL) TABS Take 1 tablet by mouth every morning.      No current facility-administered medications on file prior to visit.     Past Medical History:  Diagnosis Date  . Arthritis 2004   neg ANA & RA  . Asthma    minimal problems  . Cervical disc disease   . Difficulty sleeping    due to knee pain  . History of skin cancer   . Hyperlipidemia   . MVP (mitral valve prolapse)     Past Surgical History:  Procedure Laterality Date  . ABDOMINAL HYSTERECTOMY    . BREAST ENHANCEMENT SURGERY Bilateral   . CATARACT EXTRACTION    . COLONOSCOPY     every 5 years ; Dr Earlean Shawl  . cosmetic eye surgery    . ELBOW SURGERY      X3  . KNEE ARTHROSCOPY     rt knee  . TOTAL KNEE ARTHROPLASTY Right 10/06/2014   Procedure: RIGHT TOTAL KNEE ARTHROPLASTY;  Surgeon: Paralee Cancel, MD;  Location: [facility];  Service: Orthopedics;  Laterality: Right;  . TUBAL LIGATION      Social History   Social History  . Marital status: Divorced    Spouse name: N/A  . Number of children: N/A  . Years of education: N/A   Social History Main Topics  . Smoking status: Never Smoker  . Smokeless tobacco: Never Used  . Alcohol use 0.0 oz/week     Comment:  wine socially  . Drug use: No  . Sexual activity: Not on file   Other Topics Concern  . Not on file   Social History Narrative   No regular exercise    Family History  Problem Relation Age of Onset  . Arthritis Mother   . Colon cancer Mother   . Stroke Father   . Colon cancer Maternal Aunt   . Colon cancer Maternal Grandmother   . Colon polyps Brother     Review of Systems     Objective:  There were no vitals filed for this visit. There were no vitals filed for this visit. There is no height or weight on file to calculate BMI.  Wt Readings from Last 3 Encounters:  03/13/16 164 lb (74.4  kg)  02/28/16 164 lb 4.8 oz (74.5 kg)  02/18/16 165 lb (74.8 kg)     Physical Exam        Assessment & Plan:

## 2017-05-01 DIAGNOSIS — H5201 Hypermetropia, right eye: Secondary | ICD-10-CM | POA: Diagnosis not present

## 2017-05-01 DIAGNOSIS — H26491 Other secondary cataract, right eye: Secondary | ICD-10-CM | POA: Diagnosis not present

## 2017-05-01 DIAGNOSIS — H35371 Puckering of macula, right eye: Secondary | ICD-10-CM | POA: Diagnosis not present

## 2017-05-01 DIAGNOSIS — H43813 Vitreous degeneration, bilateral: Secondary | ICD-10-CM | POA: Diagnosis not present

## 2017-05-01 DIAGNOSIS — H17823 Peripheral opacity of cornea, bilateral: Secondary | ICD-10-CM | POA: Diagnosis not present

## 2017-05-10 DIAGNOSIS — D2262 Melanocytic nevi of left upper limb, including shoulder: Secondary | ICD-10-CM | POA: Diagnosis not present

## 2017-05-10 DIAGNOSIS — L570 Actinic keratosis: Secondary | ICD-10-CM | POA: Diagnosis not present

## 2017-05-10 DIAGNOSIS — D1801 Hemangioma of skin and subcutaneous tissue: Secondary | ICD-10-CM | POA: Diagnosis not present

## 2017-05-10 DIAGNOSIS — Z85828 Personal history of other malignant neoplasm of skin: Secondary | ICD-10-CM | POA: Diagnosis not present

## 2017-05-10 DIAGNOSIS — D485 Neoplasm of uncertain behavior of skin: Secondary | ICD-10-CM | POA: Diagnosis not present

## 2017-07-21 DIAGNOSIS — M25531 Pain in right wrist: Secondary | ICD-10-CM | POA: Diagnosis not present

## 2017-08-02 IMAGING — DX DG CERVICAL SPINE 2 OR 3 VIEWS
5 series · 5 of 5 positions shown · non-contrast
Comparison: None.

CLINICAL DATA: Neck pain since [REDACTED].  Now headache for 2 days.

EXAM:
CERVICAL SPINE - 2-3 VIEW

[c-spine ap]
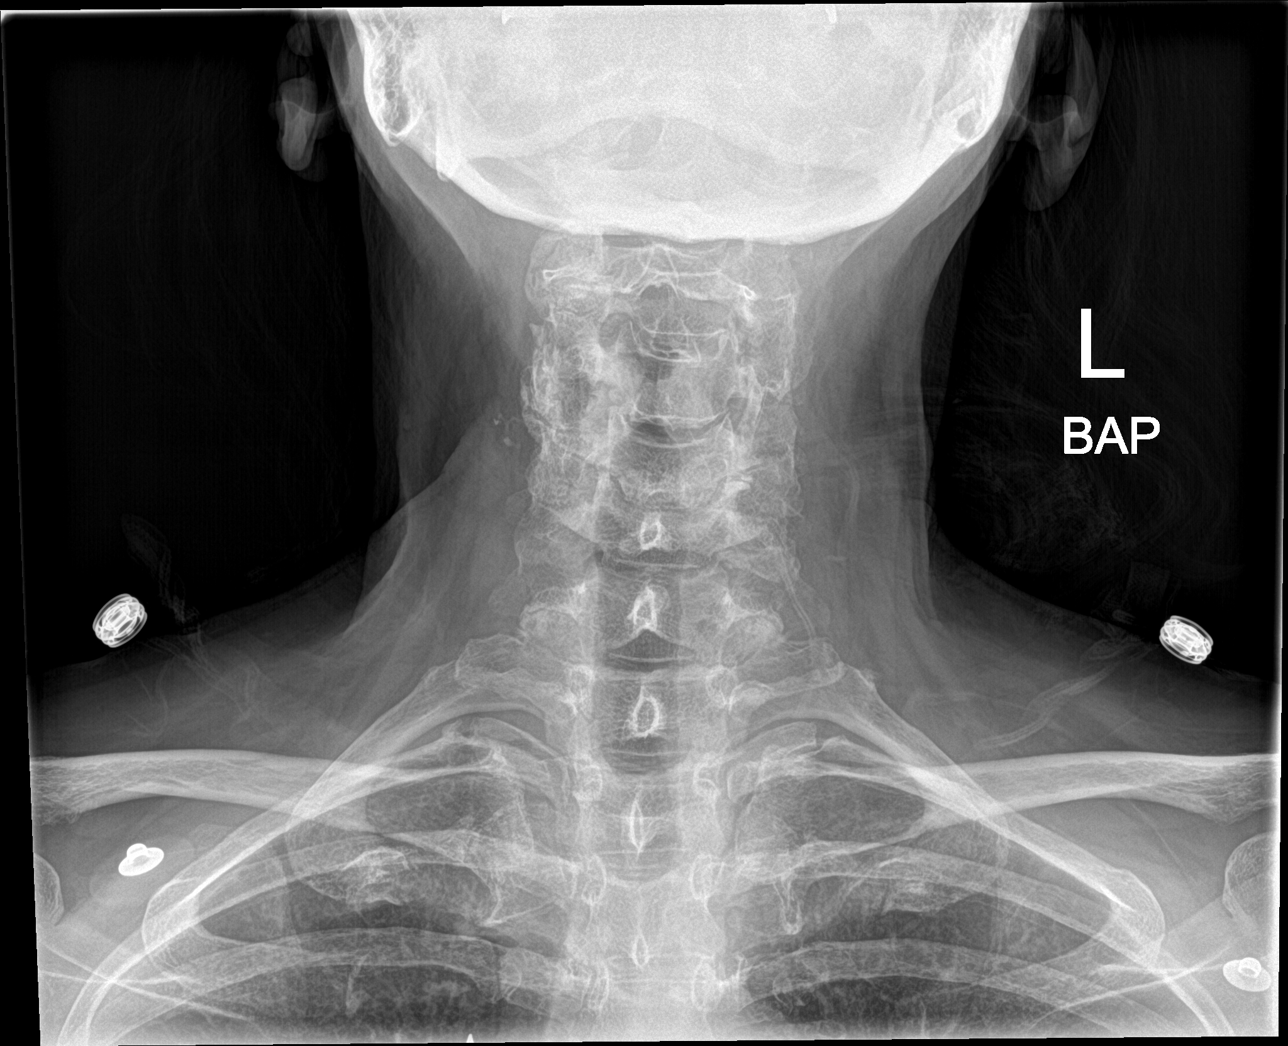

[c-spine open mouth (1 of 2)]
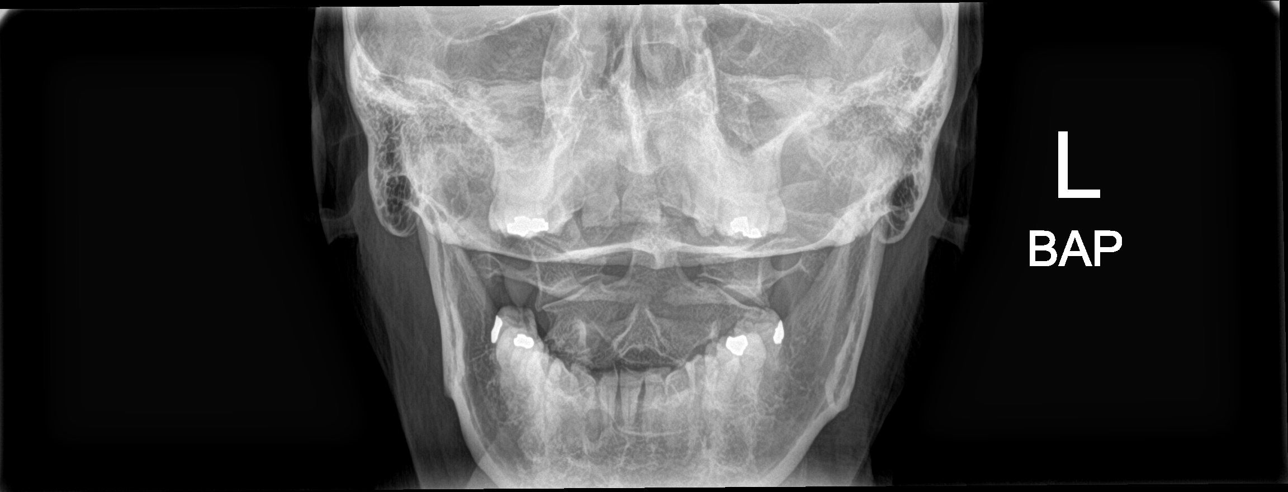

[c-spine lat]
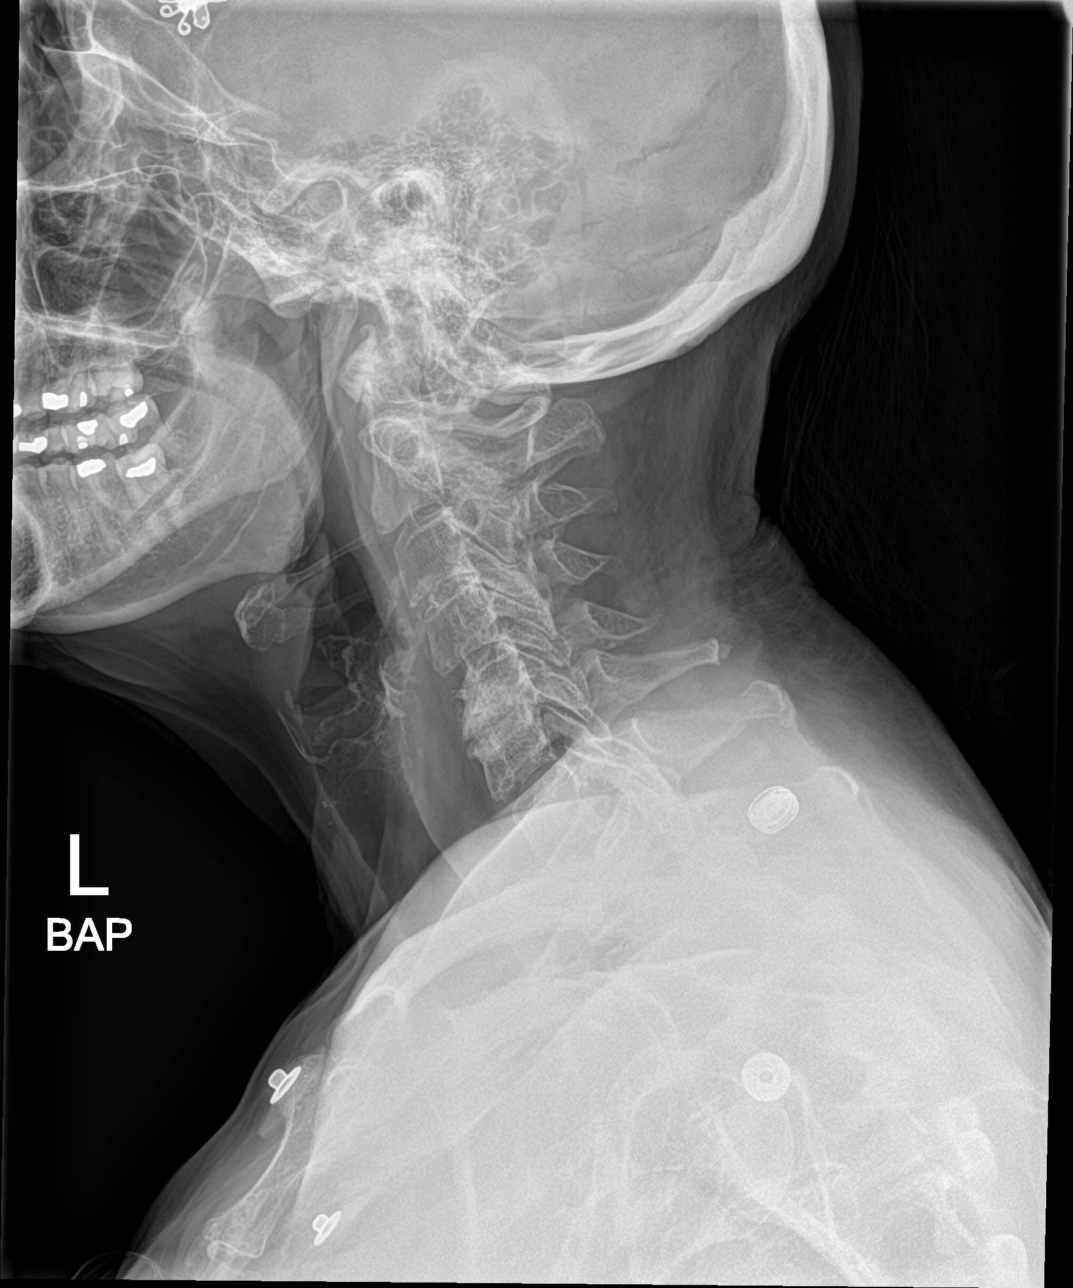

[c-spine open mouth (2 of 2)]
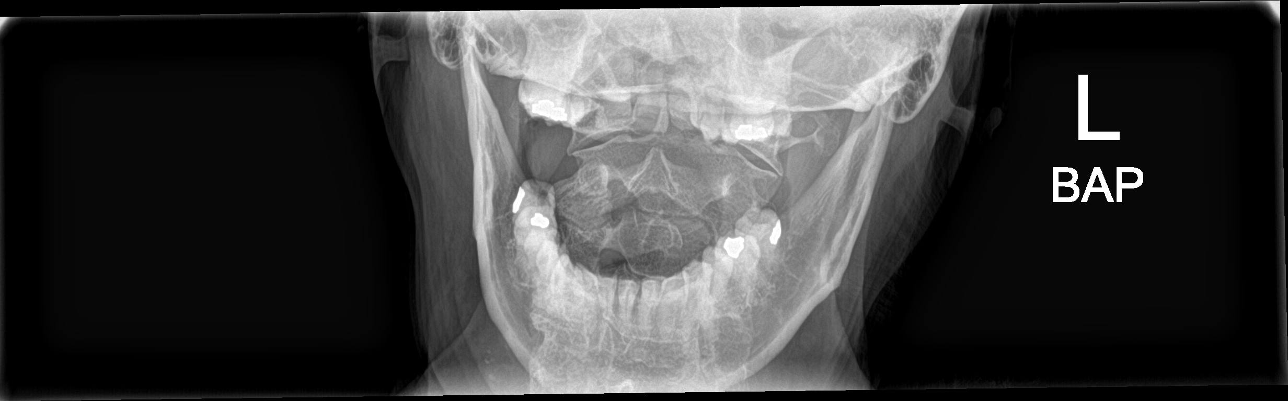

[[person_name]]
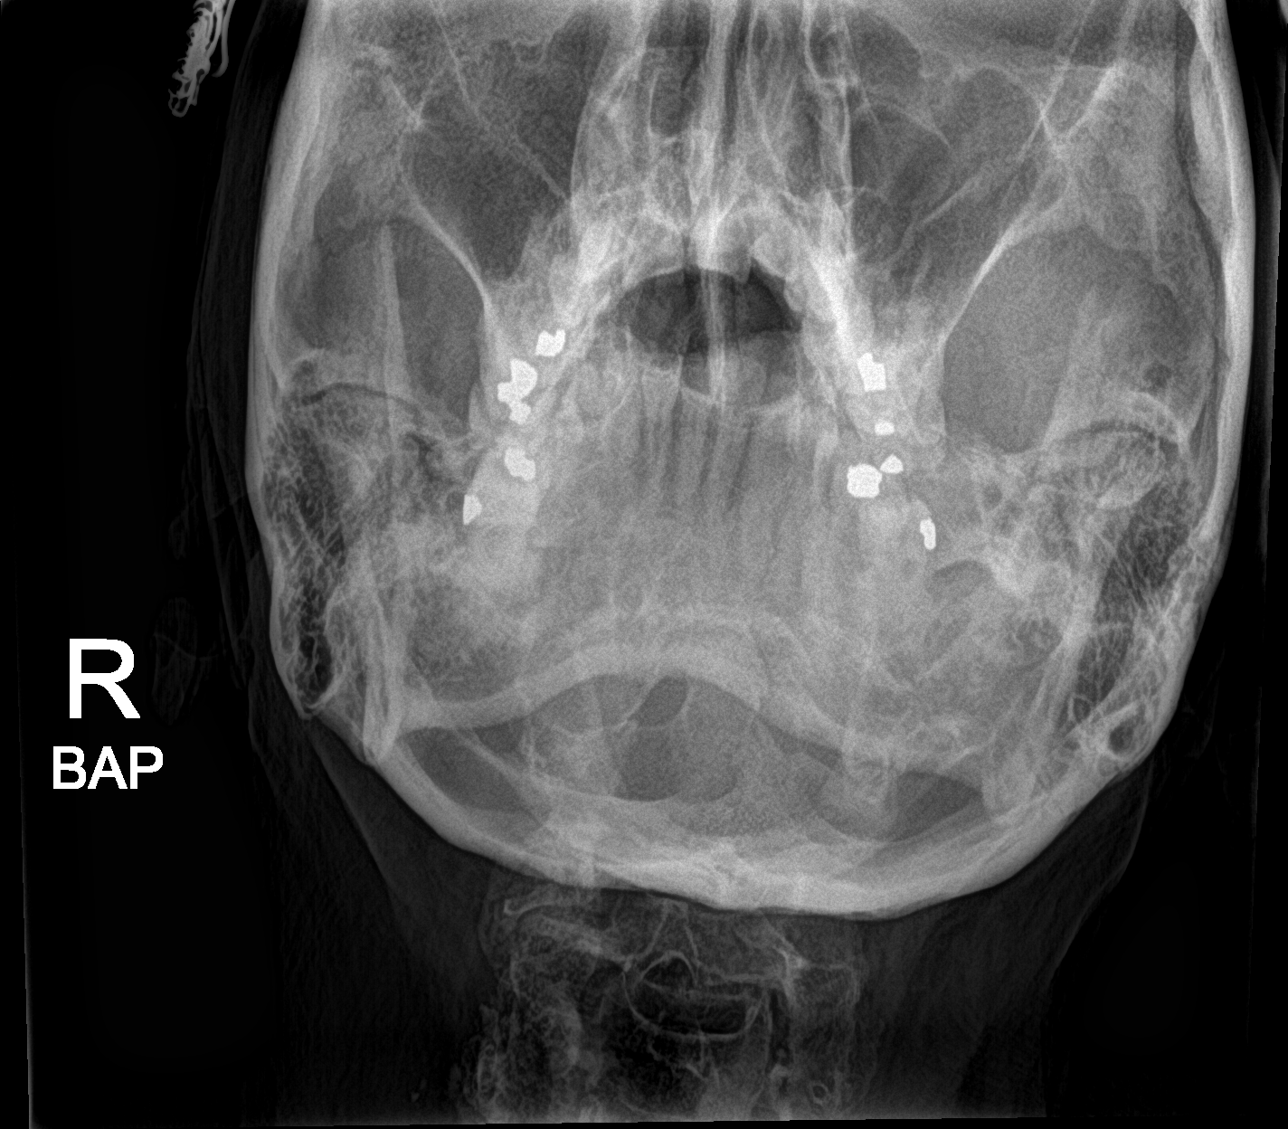

[5 of 5 positions shown; findings below may reference images not displayed]

FINDINGS: Degenerative changes with disc space narrowing and endplate
hypertrophic changes most prominent at C3-4, C5-6, and C6-7 levels.
Slight anterior subluxation of C4 on C5 is likely degenerative. No
vertebral compression deformities. No prevertebral soft tissue
swelling. C1-2 articulation appears intact. Degenerative changes in
the cervical facet joints.
IMPRESSION: Degenerative changes in the cervical spine. No acute displaced
fractures identified.

## 2018-01-01 NOTE — Progress Notes (Signed)
Subjective:    Patient ID: Isabella Valencia, female    DOB: October 15, 1940, 77 y.o.   MRN: [id number]  HPI The patient is here for an acute visit.   Yesterday around 4pm she had some [place] and vegetable.  When she got up to walk she had a sensation of the food being stuck in the lower esophagus.  She denied pain  - just discomfort.  It started a few days ago.  It occurs only after eating.  She did have some difficulty swallowing in the past week or two  - the food came back up.  It felt more like reflux and less like true difficulty swallowing food.  She thinks she has a hiatal hernia and was concerned that was causing symptoms.  She has had nausea or sickish feeling after eating for a few days only. She has had a few episodes of reflux - feeling like her food may come back up, but denies heartburn.    The past day or two she felt unwell.  She has felt weak or lightheadedness.      Medications and allergies reviewed with patient and updated if appropriate.  Patient Active Problem List   Diagnosis Date Noted  . Neck pain 03/13/2016  . Elevated troponin 02/26/2016  . Chest pain 02/26/2016  . MVP (mitral valve prolapse) 02/26/2016  . Overweight (BMI 25.0-29.9) 10/08/2014  . 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 S/P knee replacement 10/06/2014  . History of anesthesia reaction 08/20/2014  . RBBB (right bundle branch block) 05/13/2014  . Hyperlipidemia 11/04/2008  . DRY EYE SYNDROME 11/04/2008  . Intermittent asthma without complication 83/25/4982  . Osteoarthritis 11/04/2008    Current Outpatient Medications on File Prior to Visit  Medication Sig Dispense Refill  . Calcium Citrate-Vitamin D (CALCIUM + D PO) Take 1 tablet by mouth daily.    . Multiple Vitamin (ONE-A-DAY ESSENTIAL) TABS Take 1 tablet by mouth every morning.     Marland Kitchen atorvastatin (LIPITOR) 20 MG tablet Take 1 tablet (20 mg total) by mouth daily at 6 PM. (Patient not taking: Reported on 03/13/2016) 30 tablet 0   No current  facility-administered medications on file prior to visit.     Past Medical History:  Diagnosis Date  . Arthritis 2004   neg ANA & RA  . Asthma    minimal problems  . Cervical disc disease   . Difficulty sleeping    due to knee pain  . History of skin cancer   . Hyperlipidemia   . MVP (mitral valve prolapse)     Past Surgical History:  Procedure Laterality Date  . ABDOMINAL HYSTERECTOMY    . BREAST ENHANCEMENT SURGERY Bilateral   . CATARACT EXTRACTION    . COLONOSCOPY     every 5 years ; Dr Earlean Shawl  . cosmetic eye surgery    . ELBOW SURGERY      X3  . KNEE ARTHROSCOPY     rt knee  . TOTAL KNEE ARTHROPLASTY Right 10/06/2014   Procedure: RIGHT TOTAL KNEE ARTHROPLASTY;  Surgeon: Paralee Cancel, MD;  Location: [facility];  Service: Orthopedics;  Laterality: Right;  . TUBAL LIGATION      Social History   Socioeconomic History  . Marital status: Divorced    Spouse name: Not on file  . Number of children: Not on file  . Years of education: Not on file  . Highest education level: Not on file  Occupational History  . Not on file  Social  Needs  . Financial resource strain: Not on file  . Food insecurity:    Worry: Not on file    Inability: Not on file  . Transportation needs:    Medical: Not on file    Non-medical: Not on file  Tobacco Use  . Smoking status: Never Smoker  . Smokeless tobacco: Never Used  Substance and Sexual Activity  . Alcohol use: Yes    Alcohol/week: 0.0 oz    Comment:  wine socially  . Drug use: No  . Sexual activity: Not on file  Lifestyle  . Physical activity:    Days per week: Not on file    Minutes per session: Not on file  . Stress: Not on file  Relationships  . Social connections:    Talks on phone: Not on file    Gets together: Not on file    Attends religious service: Not on file    Active member of club or organization: Not on file    Attends meetings of clubs or organizations: Not on file    Relationship status: Not on file  Other  Topics Concern  . Not on file  Social History Narrative   No regular exercise    Family History  Problem Relation Age of Onset  . Arthritis Mother   . Colon cancer Mother   . Stroke Father   . Colon cancer Maternal Aunt   . Colon cancer Maternal Grandmother   . Colon polyps Brother     Review of Systems  Constitutional: Negative for chills and fever.  HENT: Positive for trouble swallowing (a couple of times).   Respiratory: Positive for shortness of breath (with strenuous exertion). Negative for cough and wheezing.   Cardiovascular: Negative for chest pain.  Gastrointestinal: Negative for abdominal pain, constipation, diarrhea and nausea.  Neurological: Positive for light-headedness.       Objective:   Vitals:   01/02/18 1406  BP: 118/72  Pulse: 96  Temp: 98.4 F (36.9 C)   BP Readings from Last 3 Encounters:  01/02/18 118/72  03/13/16 (!) 138/92  02/28/16 107/72   Wt Readings from Last 3 Encounters:  01/02/18 166 lb (75.3 kg)  03/13/16 164 lb (74.4 kg)  02/28/16 164 lb 4.8 oz (74.5 kg)   Body mass index is 28.49 kg/m.   Physical Exam    Constitutional: Appears well-developed and well-nourished. No distress.  HENT:  Head: Normocephalic and atraumatic.  Neck: Neck supple. No tracheal deviation present. No thyromegaly present.  No cervical lymphadenopathy Cardiovascular: Normal rate, regular rhythm and normal heart sounds.   No murmur heard. No carotid bruit .  No edema Pulmonary/Chest: Effort normal and breath sounds normal. No respiratory distress. No has no wheezes. No rales.  Abdomen: Soft, nontender, nondistended Skin: Skin is warm and dry. Not diaphoretic.  Psychiatric: Normal mood and affect. Behavior is normal.       Assessment & Plan:    See Problem List for Assessment and Plan of chronic medical problems.

## 2018-01-02 ENCOUNTER — Ambulatory Visit (INDEPENDENT_AMBULATORY_CARE_PROVIDER_SITE_OTHER): Payer: Medicare Other | Admitting: Internal Medicine

## 2018-01-02 ENCOUNTER — Encounter: Payer: Self-pay | Admitting: Internal Medicine

## 2018-01-02 VITALS — BP 118/72 | HR 96 | Temp 98.4°F | Wt 166.0 lb

## 2018-01-02 DIAGNOSIS — K219 Gastro-esophageal reflux disease without esophagitis: Secondary | ICD-10-CM

## 2018-01-02 MED ORDER — RANITIDINE HCL 150 MG PO TABS: 150.0000 mg | ORAL_TABLET | Freq: Two times a day (BID) | ORAL | 5 refills | Status: DC

## 2018-01-02 NOTE — Assessment & Plan Note (Signed)
Symptoms are relatively new-few days to a week Experiencing some reflux, but no heartburn.  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

## 2018-01-02 NOTE — Patient Instructions (Signed)
  Medications reviewed and updated.  Changes include starting ranitidine 150 mg in the evening or at bedtime - take for at least 4 weeks.  You may need it long term.   Your prescription(s) have been submitted to your pharmacy. Please take as directed and contact our office if you believe you are having problem(s) with the medication(s).  Call or return if no improvement

## 2018-05-01 DIAGNOSIS — H17823 Peripheral opacity of cornea, bilateral: Secondary | ICD-10-CM | POA: Diagnosis not present

## 2018-05-01 DIAGNOSIS — Z961 Presence of intraocular lens: Secondary | ICD-10-CM | POA: Diagnosis not present

## 2018-05-01 DIAGNOSIS — H26491 Other secondary cataract, right eye: Secondary | ICD-10-CM | POA: Diagnosis not present

## 2018-05-01 DIAGNOSIS — H35371 Puckering of macula, right eye: Secondary | ICD-10-CM | POA: Diagnosis not present

## 2018-07-18 NOTE — Progress Notes (Signed)
Subjective:    Patient ID: Isabella Valencia, female    DOB: 12-21-1940, 78 y.o.   MRN: [id number]  HPI The patient is here for an acute visit.   Change in bowel habits-loose stools, increased gas: Her symptoms started about one month ago.  Her stools prior to this were daily to every other day and her stools were soft-formed.  Her stools are now loose and she has 1-2 bowel movements a day.  She denies abd pain.  She has some nausea.    If she eats salads she usually gets blow out diarrhea and that has been going on for a while - it is not new.     She takes one aleve a day since thanksgiving.  2-3 times a week in the past week she noticed her stools were black a few times this week.  She was worried about possible bleeding.  She is not taking any iron and denies otc anti-diarrheal meds.    She denies antibiotics in the past 3 months, travel in past three months.  She has started taking tumeric, but no other changes in meds.    Medications and allergies reviewed with patient and updated if appropriate.  Patient Active Problem List   Diagnosis Date Noted  . Loose stools 07/19/2018  . Gastroesophageal reflux disease 01/02/2018  . Neck pain 03/13/2016  . Elevated troponin 02/26/2016  . Chest pain 02/26/2016  . MVP (mitral valve prolapse) 02/26/2016  . Overweight (BMI 25.0-29.9) 10/08/2014  . 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 S/P knee replacement 10/06/2014  . History of anesthesia reaction 08/20/2014  . RBBB (right bundle branch block) 05/13/2014  . Hyperlipidemia 11/04/2008  . DRY EYE SYNDROME 11/04/2008  . Intermittent asthma without complication 71/24/5809  . Osteoarthritis 11/04/2008    Current Outpatient Medications on File Prior to Visit  Medication Sig Dispense Refill  . Calcium Citrate-Vitamin D (CALCIUM + D PO) Take 1 tablet by mouth daily.    . Multiple Vitamin (ONE-A-DAY ESSENTIAL) TABS Take 1 tablet by mouth every morning.     . ranitidine (ZANTAC) 150 MG tablet Take 1  tablet (150 mg total) by mouth 2 (two) times daily. 30 tablet 5   No current facility-administered medications on file prior to visit.     Past Medical History:  Diagnosis Date  . Arthritis 2004   neg ANA & RA  . Asthma    minimal problems  . Cervical disc disease   . Difficulty sleeping    due to knee pain  . History of skin cancer   . Hyperlipidemia   . MVP (mitral valve prolapse)     Past Surgical History:  Procedure Laterality Date  . ABDOMINAL HYSTERECTOMY    . BREAST ENHANCEMENT SURGERY Bilateral   . CATARACT EXTRACTION    . COLONOSCOPY     every 5 years ; Dr Earlean Shawl  . cosmetic eye surgery    . ELBOW SURGERY      X3  . KNEE ARTHROSCOPY     rt knee  . TOTAL KNEE ARTHROPLASTY Right 10/06/2014   Procedure: RIGHT TOTAL KNEE ARTHROPLASTY;  Surgeon: Paralee Cancel, MD;  Location: [facility];  Service: Orthopedics;  Laterality: Right;  . TUBAL LIGATION      Social History   Socioeconomic History  . Marital status: Divorced    Spouse name: Not on file  . Number of children: Not on file  . Years of education: Not on file  . Highest education level:  Not on file  Occupational History  . Not on file  Social Needs  . Financial resource strain: Not on file  . Food insecurity:    Worry: Not on file    Inability: Not on file  . Transportation needs:    Medical: Not on file    Non-medical: Not on file  Tobacco Use  . Smoking status: Never Smoker  . Smokeless tobacco: Never Used  Substance and Sexual Activity  . Alcohol use: Yes    Alcohol/week: 0.0 standard drinks    Comment:  wine socially  . Drug use: No  . Sexual activity: Not on file  Lifestyle  . Physical activity:    Days per week: Not on file    Minutes per session: Not on file  . Stress: Not on file  Relationships  . Social connections:    Talks on phone: Not on file    Gets together: Not on file    Attends religious service: Not on file    Active member of club or organization: Not on file    Attends  meetings of clubs or organizations: Not on file    Relationship status: Not on file  Other Topics Concern  . Not on file  Social History Narrative   No regular exercise    Family History  Problem Relation Age of Onset  . Arthritis Mother   . Colon cancer Mother   . Stroke Father   . Colon cancer Maternal Aunt   . Colon cancer Maternal Grandmother   . Colon polyps Brother     Review of Systems  Constitutional: Negative for chills and fever.  Gastrointestinal: Positive for nausea (occ). Negative for abdominal pain, blood in stool and vomiting.       Loose stools.  Black stool this week a few times. occ GERD - no inc recently  Neurological: Negative for light-headedness and headaches.       Objective:   Vitals:   07/19/18 1404  BP: 136/72  Pulse: 65  Resp: 16  Temp: 98.1 F (36.7 C)  SpO2: 97%   BP Readings from Last 3 Encounters:  07/19/18 136/72  01/02/18 118/72  03/13/16 (!) 138/92   Wt Readings from Last 3 Encounters:  07/19/18 167 lb (75.8 kg)  01/02/18 166 lb (75.3 kg)  03/13/16 164 lb (74.4 kg)   Body mass index is 28.67 kg/m.   Physical Exam Constitutional:      General: She is not in acute distress.    Appearance: Normal appearance. She is not ill-appearing.  HENT:     Head: Normocephalic and atraumatic.  Abdominal:     General: Abdomen is flat. Bowel sounds are normal. There is no distension.     Palpations: Abdomen is soft. There is no mass.     Tenderness: There is no abdominal tenderness. There is no guarding or rebound.  Skin:    General: Skin is warm and dry.  Neurological:     Mental Status: She is alert.            Assessment & Plan:    See Problem List for Assessment and Plan of chronic medical problems.

## 2018-07-19 ENCOUNTER — Encounter: Payer: Self-pay | Admitting: Internal Medicine

## 2018-07-19 ENCOUNTER — Ambulatory Visit (INDEPENDENT_AMBULATORY_CARE_PROVIDER_SITE_OTHER): Payer: Medicare Other | Admitting: Internal Medicine

## 2018-07-19 VITALS — BP 136/72 | HR 65 | Temp 98.1°F | Resp 16 | Ht 64.0 in | Wt 167.0 lb

## 2018-07-19 DIAGNOSIS — R195 Other fecal abnormalities: Secondary | ICD-10-CM

## 2018-07-19 NOTE — Patient Instructions (Signed)
Have blood work today.  Send your stool cards in to check for blood.     Medications reviewed and updated.  Changes include :   Try metamucil daily.     Call if no improvement

## 2018-07-20 NOTE — Assessment & Plan Note (Signed)
Loose stools for one month ? Black stools this week x 3 Has been taking 1 aleve for a couple of months Will r/o UGIB - cbc, cmp, fecal occult Monitor stools  If above negative asked her to try metamucil to help bulk up stools

## 2018-07-25 ENCOUNTER — Other Ambulatory Visit: Payer: Medicare Other

## 2018-07-25 ENCOUNTER — Other Ambulatory Visit (INDEPENDENT_AMBULATORY_CARE_PROVIDER_SITE_OTHER): Payer: Medicare Other

## 2018-07-25 DIAGNOSIS — R195 Other fecal abnormalities: Secondary | ICD-10-CM | POA: Diagnosis not present

## 2018-07-25 LAB — FECAL OCCULT BLOOD, IMMUNOCHEMICAL: Fecal Occult Bld: NEGATIVE

## 2018-08-01 DIAGNOSIS — M25551 Pain in right hip: Secondary | ICD-10-CM | POA: Diagnosis not present

## 2018-08-01 DIAGNOSIS — M1611 Unilateral primary osteoarthritis, right hip: Secondary | ICD-10-CM | POA: Diagnosis not present

## 2018-08-22 DIAGNOSIS — M25551 Pain in right hip: Secondary | ICD-10-CM | POA: Diagnosis not present

## 2018-08-30 DIAGNOSIS — M1711 Unilateral primary osteoarthritis, right knee: Secondary | ICD-10-CM | POA: Diagnosis not present

## 2018-08-30 DIAGNOSIS — M25551 Pain in right hip: Secondary | ICD-10-CM | POA: Diagnosis not present

## 2018-08-30 DIAGNOSIS — M1611 Unilateral primary osteoarthritis, right hip: Secondary | ICD-10-CM | POA: Insufficient documentation

## 2018-09-05 DIAGNOSIS — M1611 Unilateral primary osteoarthritis, right hip: Secondary | ICD-10-CM | POA: Diagnosis not present

## 2018-09-05 DIAGNOSIS — M25551 Pain in right hip: Secondary | ICD-10-CM | POA: Diagnosis not present

## 2018-10-09 DIAGNOSIS — M25551 Pain in right hip: Secondary | ICD-10-CM | POA: Diagnosis not present

## 2018-10-09 DIAGNOSIS — M1611 Unilateral primary osteoarthritis, right hip: Secondary | ICD-10-CM | POA: Diagnosis not present

## 2018-11-21 NOTE — Progress Notes (Signed)
Subjective:    Patient ID: Isabella Valencia, female    DOB: [birth year], 78 y.o.   MRN: [id number]  HPI She is here for pre-operative clearance at the request of Dr Alvan Dame for right total hip arthroplasty scheduled for 12/10/18.   She has had issues with anesthesia in the past-difficulty waking up.  She was advised in the past that she needs a smaller dose.  She denies family history of problems with anesthesia or bleeding/blood clot problems.    She has no concerns.  She is taking only supplements..   She is not exercising regularly due to the hip pain, but is active around her house..  With her daily activities she denies chest pain, palpitations, SOB and lightheadedness.    She is currently taking aspirin and aleve for her pain.     Medications and allergies reviewed with patient and updated if appropriate.  Patient Active Problem List   Diagnosis Date Noted  . Hyperglycemia 11/22/2018  . Pre-op evaluation 11/22/2018  . Osteoarthritis of right hip 08/30/2018  . Mitral regurgitation 02/26/2016  . Overweight (BMI 25.0-29.9) 10/08/2014  . 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 S/P knee replacement 10/06/2014  . History of anesthesia reaction 08/20/2014  . RBBB (right bundle branch block) 05/13/2014  . Hyperlipidemia 11/04/2008  . DRY EYE SYNDROME 11/04/2008  . Intermittent asthma without complication [id number]/17/4081    Current Outpatient Medications on File Prior to Visit  Medication Sig Dispense Refill  . Calcium Citrate-Vitamin D (CALCIUM + D PO) Take 1 tablet by mouth daily.    . Multiple Vitamin (ONE-A-DAY ESSENTIAL) TABS Take 1 tablet by mouth every morning.      No current facility-administered medications on file prior to visit.     Past Medical History:  Diagnosis Date  . Arthritis 2004   neg ANA & RA  . Asthma    minimal problems  . Cervical disc disease   . Difficulty sleeping    due to knee pain  . History of skin cancer   . Hyperlipidemia   . MVP (mitral valve prolapse)      Past Surgical History:  Procedure Laterality Date  . ABDOMINAL HYSTERECTOMY    . BREAST ENHANCEMENT SURGERY Bilateral   . CATARACT EXTRACTION    . COLONOSCOPY     every 5 years ; Dr Earlean Shawl  . cosmetic eye surgery    . ELBOW SURGERY      X3  . KNEE ARTHROSCOPY     rt knee  . TOTAL KNEE ARTHROPLASTY Right 10/06/2014   Procedure: RIGHT TOTAL KNEE ARTHROPLASTY;  Surgeon: Paralee Cancel, MD;  Location: [facility];  Service: Orthopedics;  Laterality: Right;  . TUBAL LIGATION      Social History   Socioeconomic History  . Marital status: Divorced    Spouse name: Not on file  . Number of children: Not on file  . Years of education: Not on file  . Highest education level: Not on file  Occupational History  . Not on file  Social Needs  . Financial resource strain: Not on file  . Food insecurity:    Worry: Not on file    Inability: Not on file  . Transportation needs:    Medical: Not on file    Non-medical: Not on file  Tobacco Use  . Smoking status: Never Smoker  . Smokeless tobacco: Never Used  Substance and Sexual Activity  . Alcohol use: Yes    Alcohol/week: 0.0 standard drinks  Comment:  wine socially  . Drug use: No  . Sexual activity: Not on file  Lifestyle  . Physical activity:    Days per week: Not on file    Minutes per session: Not on file  . Stress: Not on file  Relationships  . Social connections:    Talks on phone: Not on file    Gets together: Not on file    Attends religious service: Not on file    Active member of club or organization: Not on file    Attends meetings of clubs or organizations: Not on file    Relationship status: Not on file  Other Topics Concern  . Not on file  Social History Narrative   No regular exercise    Family History  Problem Relation Age of Onset  . Arthritis Mother   . Colon cancer Mother   . Stroke Father   . Colon cancer Maternal Aunt   . Colon cancer Maternal Grandmother   . Colon polyps Brother     Review of  Systems  Constitutional: Negative for chills and fever.  Eyes: Negative for visual disturbance.  Respiratory: Negative for cough, shortness of breath and wheezing.   Cardiovascular: Negative for chest pain, palpitations and leg swelling.  Gastrointestinal: Negative for abdominal pain, blood in stool, constipation, diarrhea and nausea.       No gerd  Genitourinary: Negative for dysuria and hematuria.  Musculoskeletal: Positive for arthralgias.  Skin: Negative for rash.  Neurological: Positive for headaches (occ). Negative for dizziness and light-headedness.  Psychiatric/Behavioral: Negative for dysphoric mood. The patient is not nervous/anxious.        Objective:   Vitals:   11/22/18 1434  BP: 102/68  Pulse: (!) 109  Resp: 16  Temp: 97.8 F (36.6 C)  SpO2: 97%   Filed Weights   11/22/18 1434  Weight: 167 lb (75.8 kg)   Body mass index is 28.67 kg/m.  BP Readings from Last 3 Encounters:  11/22/18 102/68  07/19/18 136/72  01/02/18 118/72    Wt Readings from Last 3 Encounters:  11/22/18 167 lb (75.8 kg)  07/19/18 167 lb (75.8 kg)  01/02/18 166 lb (75.3 kg)     Physical Exam Constitutional: She appears well-developed and well-nourished. No distress.  HENT:  Head: Normocephalic and atraumatic.  Right Ear: External ear normal. Normal ear canal and TM Left Ear: External ear normal.  Normal ear canal and TM Mouth/Throat: Oropharynx is clear and moist.  Eyes: Conjunctivae and EOM are normal.  Neck: Neck supple. No tracheal deviation present. No thyromegaly present.  No carotid bruit  Cardiovascular: Normal rate, regular rhythm and normal heart sounds.   2/6 systolic murmur heard.  No edema. Pulmonary/Chest: Effort normal and breath sounds normal. No respiratory distress. She has no wheezes. She has no rales.  Abdominal: Soft. She exhibits no distension. There is no tenderness.  Lymphadenopathy: She has no cervical adenopathy.  Skin: Skin is warm and dry. She is not  diaphoretic.  Psychiatric: She has a normal mood and affect. Her behavior is normal.        Assessment & Plan:    Pneumovax given today  See Problem List for Assessment and Plan of chronic medical problems.

## 2018-11-21 NOTE — Progress Notes (Signed)
Please place orders in Epic as patient has a pre-op appointment on 12/03/2018! Thank you! Patient DOB-1941/02/28, surgery date-12/10/2018.

## 2018-11-22 ENCOUNTER — Other Ambulatory Visit (INDEPENDENT_AMBULATORY_CARE_PROVIDER_SITE_OTHER): Payer: Medicare Other

## 2018-11-22 ENCOUNTER — Ambulatory Visit (INDEPENDENT_AMBULATORY_CARE_PROVIDER_SITE_OTHER): Payer: Medicare Other | Admitting: Internal Medicine

## 2018-11-22 ENCOUNTER — Other Ambulatory Visit: Payer: Self-pay

## 2018-11-22 ENCOUNTER — Encounter: Payer: Self-pay | Admitting: Internal Medicine

## 2018-11-22 VITALS — BP 102/68 | HR 109 | Temp 97.8°F | Resp 16 | Ht 64.0 in | Wt 167.0 lb

## 2018-11-22 DIAGNOSIS — M1611 Unilateral primary osteoarthritis, right hip: Secondary | ICD-10-CM

## 2018-11-22 DIAGNOSIS — I340 Nonrheumatic mitral (valve) insufficiency: Secondary | ICD-10-CM | POA: Diagnosis not present

## 2018-11-22 DIAGNOSIS — Z23 Encounter for immunization: Secondary | ICD-10-CM | POA: Diagnosis not present

## 2018-11-22 DIAGNOSIS — Z01818 Encounter for other preprocedural examination: Secondary | ICD-10-CM | POA: Insufficient documentation

## 2018-11-22 DIAGNOSIS — R739 Hyperglycemia, unspecified: Secondary | ICD-10-CM

## 2018-11-22 DIAGNOSIS — J4520 Mild intermittent asthma, uncomplicated: Secondary | ICD-10-CM

## 2018-11-22 DIAGNOSIS — Z87898 Personal history of other specified conditions: Secondary | ICD-10-CM | POA: Diagnosis not present

## 2018-11-22 DIAGNOSIS — E7849 Other hyperlipidemia: Secondary | ICD-10-CM

## 2018-11-22 LAB — COMPREHENSIVE METABOLIC PANEL
ALT: 6 U/L (ref 0–35)
AST: 15 U/L (ref 0–37)
Albumin: 4.3 g/dL (ref 3.5–5.2)
Alkaline Phosphatase: 81 U/L (ref 39–117)
BUN: 17 mg/dL (ref 6–23)
CO2: 27 mEq/L (ref 19–32)
Calcium: 9.8 mg/dL (ref 8.4–10.5)
Chloride: 102 mEq/L (ref 96–112)
Creatinine, Ser: 0.94 mg/dL (ref 0.40–1.20)
GFR: 57.54 mL/min — ABNORMAL LOW (ref >60.00)
Glucose, Bld: 87 mg/dL (ref 70–99)
Potassium: 4.1 mEq/L (ref 3.5–5.1)
Sodium: 139 mEq/L (ref 135–145)
Total Bilirubin: 0.5 mg/dL (ref 0.2–1.2)
Total Protein: 7.5 g/dL (ref 6.0–8.3)

## 2018-11-22 LAB — CBC WITH DIFFERENTIAL/PLATELET
Basophils Absolute: 0.1 10*3/uL (ref 0.0–0.1)
Basophils Relative: 1.2 % (ref 0.0–3.0)
Eosinophils Absolute: 0.1 10*3/uL (ref 0.0–0.7)
Eosinophils Relative: 1.3 % (ref 0.0–5.0)
HCT: 36.7 % (ref 36.0–46.0)
Hemoglobin: 12.5 g/dL (ref 12.0–15.0)
Lymphocytes Relative: 15.1 % (ref 12.0–46.0)
Lymphs Abs: 1.5 10*3/uL (ref 0.7–4.0)
MCHC: 34.1 g/dL (ref 30.0–36.0)
MCV: 95.6 fl (ref 78.0–100.0)
Monocytes Absolute: 0.9 10*3/uL (ref 0.1–1.0)
Monocytes Relative: 9 % (ref 3.0–12.0)
Neutro Abs: 7.2 10*3/uL (ref 1.4–7.7)
Neutrophils Relative %: 73.4 % (ref 43.0–77.0)
Platelets: 307 10*3/uL (ref 150.0–400.0)
RBC: 3.84 Mil/uL — ABNORMAL LOW (ref 3.87–5.11)
RDW: 13.6 % (ref 11.5–15.5)
WBC: 9.8 10*3/uL (ref 4.0–10.5)

## 2018-11-22 LAB — HEMOGLOBIN A1C: Hgb A1c MFr Bld: 5.8 % (ref 4.6–6.5)

## 2018-11-22 LAB — TSH: TSH: 2.21 u[IU]/mL (ref 0.35–4.50)

## 2018-11-22 MED ORDER — TURMERIC CURCUMIN 500 MG PO CAPS: ORAL_CAPSULE | ORAL | Status: DC

## 2018-11-22 NOTE — Assessment & Plan Note (Signed)
Diet controlled No need to check lipid panel at this time-Will recheck at her next routine visit

## 2018-11-22 NOTE — Assessment & Plan Note (Signed)
Medically stable No heart disease or COPD Low risk for low risk procedure-medically cleared  She has had some difficulty waking up from anesthesia in the past-no allergic reactions or anything concerning.  She will discuss at her preop visit at the hospital  Routine blood work today.  No further evaluation necessary before surgery

## 2018-11-22 NOTE — Patient Instructions (Addendum)
  Tests ordered today. Your results will be released to [place] (or called to you) after review, usually within 72hours after test completion. If any changes need to be made, you will be notified at that same time.   Pneumonia immunization administered today.   Medications reviewed and updated.  Changes include :   none

## 2018-11-22 NOTE — Assessment & Plan Note (Signed)
Had difficulty waking her up after a couple of different surgeries Was advised that she needs a low dose Will discuss in her preoperative visit at the hospital.  Her surgeon is also aware. No history suggestive of allergic reactions for her or her family

## 2018-11-22 NOTE — Assessment & Plan Note (Signed)
Severe arthritis Conservative measures have not been effective in controlling her pain and she will be having surgery 12/10/2018

## 2018-11-22 NOTE — Assessment & Plan Note (Signed)
Mild-moderate on echo from 2017 Asymptomatic

## 2018-11-22 NOTE — Assessment & Plan Note (Signed)
No active asthma symptoms and has not been on medication for a long time History of asthma only

## 2018-11-22 NOTE — Assessment & Plan Note (Signed)
Check a1c Low sugar / carb diet Stressed regular exercise   

## 2018-12-02 NOTE — Progress Notes (Signed)
11/22/2018- noted in Epic-Medical Clearance from Dr. Billey Gosling   02/28/2016- noted in [facility]- ECHO

## 2018-12-02 NOTE — Patient Instructions (Addendum)
Isabella Valencia  12/02/2018   Your procedure is scheduled on: Tuesday 12/10/2018   Report to [facility] Main  Entrance              Report to admitting at  0735 AM               YOU NEED TO HAVE A COVID 19 TEST ON_____Friday, June 5th_______, THIS TEST MUST BE DONE BEFORE SURGERY, COME TO [place].    Call this number if you have problems the morning of surgery [phone]    Remember: Do not eat food  :After Midnight.               NO SOLID FOOD AFTER MIDNIGHT THE NIGHT PRIOR TO SURGERY. NOTHING BY MOUTH EXCEPT CLEAR LIQUIDS UNTIL  0430 am.             PLEASE FINISH  DRINK PER SURGEON ORDER WHICH NEEDS TO BE COMPLETED AT   0430 am.    CLEAR LIQUID DIET   Foods Allowed                                                                     Foods Excluded  Coffee and tea, regular and decaf                             liquids that you cannot  Plain Jell-O in any flavor                                             see through such as: Fruit ices (not with fruit pulp)                                     milk, soups, orange juice  Iced Popsicles                                    All solid food Carbonated beverages, regular and diet                                    Cranberry, grape and apple juices Sports drinks like Gatorade Lightly seasoned clear broth or consume(fat free) Sugar, honey syrup  Sample Menu Breakfast                                Lunch                                     Supper Cranberry juice  Beef broth                            Chicken broth Jell-O                                     Grape juice                           Apple juice Coffee or tea                        Jell-O                                      Popsicle                                                Coffee or tea                        Coffee or  tea  _____________________________________________________________________               BRUSH YOUR TEETH MORNING OF SURGERY AND RINSE YOUR MOUTH OUT, NO CHEWING GUM CANDY OR MINTS.     Take these medicines the morning of surgery with A SIP OF WATER: NONE                                  You may not have any metal on your body including hair pins and              piercings  Do not wear jewelry, make-up, lotions, powders or perfumes, deodorant             Do not wear nail polish.  Do not shave  48 hours prior to surgery.              Do not bring valuables to the hospital. [place].  Contacts, dentures or bridgework may not be worn into surgery.  Leave suitcase in the car. After surgery it may be brought to your room.                  Please read over the following fact sheets you were given: _____________________________________________________________________             [facility] - Preparing for Surgery Before surgery, you can play an important role.  Because skin is not sterile, your skin needs to be as free of germs as possible.  You can reduce the number of germs on your skin by washing with CHG (chlorahexidine gluconate) soap before surgery.  CHG is an antiseptic cleaner which kills germs and bonds with the skin to continue killing germs even after washing. Please DO NOT use if you have an allergy to CHG or antibacterial soaps.  If your skin becomes reddened/irritated stop using the CHG and inform your nurse when you arrive at [facility]. Do not shave (including legs and underarms) for at least 48 hours prior to the first CHG shower.  You may shave your face/neck. Please follow these instructions carefully:  1.  Shower with CHG Soap the night before surgery and the  morning of Surgery.  2.  If you choose to wash your hair, wash your hair first as usual with your  normal  shampoo.  3.  After you shampoo, rinse your hair  and body thoroughly to remove the  shampoo.                           4.  Use CHG as you would any other liquid soap.  You can apply chg directly  to the skin and wash                       Gently with a scrungie or clean washcloth.  5.  Apply the CHG Soap to your body ONLY FROM THE NECK DOWN.   Do not use on face/ open                           Wound or open sores. Avoid contact with eyes, ears mouth and genitals (private parts).                       Wash face,  Genitals (private parts) with your normal soap.             6.  Wash thoroughly, paying special attention to the area where your surgery  will be performed.  7.  Thoroughly rinse your body with warm water from the neck down.  8.  DO NOT shower/wash with your normal soap after using and rinsing off  the CHG Soap.                9.  Pat yourself dry with a clean towel.            10.  Wear clean pajamas.            11.  Place clean sheets on your bed the night of your first shower and do not  sleep with pets. Day of Surgery : Do not apply any lotions/deodorants the morning of surgery.  Please wear clean clothes to the hospital/surgery center.  FAILURE TO FOLLOW THESE INSTRUCTIONS MAY RESULT IN THE CANCELLATION OF YOUR SURGERY PATIENT SIGNATURE_________________________________  NURSE SIGNATURE__________________________________  ________________________________________________________________________   Isabella Valencia  An incentive spirometer is a tool that can help keep your lungs clear and active. This tool measures how well you are filling your lungs with each breath. Taking long deep breaths may help reverse or decrease the chance of developing breathing (pulmonary) problems (especially infection) following:  A long period of time when you are unable to move or be active. BEFORE THE PROCEDURE   If the spirometer includes an indicator to show your best effort, your nurse or respiratory therapist will set it to a desired  goal.  If possible, sit up straight or lean slightly forward. Try not to slouch.  Hold the incentive spirometer in an upright position. INSTRUCTIONS FOR USE  1. Sit on the edge of your bed if possible, or sit up as far as you can in bed or on a chair. 2. Hold the incentive spirometer in an upright position. 3. Breathe out normally. 4. Place the  mouthpiece in your mouth and seal your lips tightly around it. 5. Breathe in slowly and as deeply as possible, raising the piston or the ball toward the top of the column. 6. Hold your breath for 3-5 seconds or for as long as possible. Allow the piston or ball to fall to the bottom of the column. 7. Remove the mouthpiece from your mouth and breathe out normally. 8. Rest for a few seconds and repeat Steps 1 through 7 at least 10 times every 1-2 hours when you are awake. Take your time and take a few normal breaths between deep breaths. 9. The spirometer may include an indicator to show your best effort. Use the indicator as a goal to work toward during each repetition. 10. After each set of 10 deep breaths, practice coughing to be sure your lungs are clear. If you have an incision (the cut made at the time of surgery), support your incision when coughing by placing a pillow or rolled up towels firmly against it. Once you are able to get out of bed, walk around indoors and cough well. You may stop using the incentive spirometer when instructed by your caregiver.  RISKS AND COMPLICATIONS  Take your time so you do not get dizzy or light-headed.  If you are in pain, you may need to take or ask for pain medication before doing incentive spirometry. It is harder to take a deep breath if you are having pain. AFTER USE  Rest and breathe slowly and easily.  It can be helpful to keep track of a log of your progress. Your caregiver can provide you with a simple table to help with this. If you are using the spirometer at home, follow these instructions: [place] IF:   You are having difficultly using the spirometer.  You have trouble using the spirometer as often as instructed.  Your pain medication is not giving enough relief while using the spirometer.  You develop fever of 100.5 F (38.1 C) or higher. SEEK IMMEDIATE MEDICAL CARE IF:   You cough up bloody sputum that had not been present before.  You develop fever of 102 F (38.9 C) or greater.  You develop worsening pain at or near the incision site. MAKE SURE YOU:   Understand these instructions.  Will watch your condition.  Will get help right away if you are not doing well or get worse. Document Released: 10/30/2006 Document Revised: 09/11/2011 Document Reviewed: 12/31/2006 Exit[employer] Patient Information 2014 ExitCare, [place].   ________________________________________________________________________  WHAT IS A BLOOD TRANSFUSION? Blood Transfusion Information  A transfusion is the replacement of blood or some of its parts. Blood is made up of multiple cells which provide different functions.  Red blood cells carry oxygen and are used for blood loss replacement.  White blood cells fight against infection.  Platelets control bleeding.  Plasma helps clot blood.  Other blood products are available for specialized needs, such as hemophilia or other clotting disorders. BEFORE THE TRANSFUSION  Who gives blood for transfusions?   Healthy volunteers who are fully evaluated to make sure their blood is safe. This is blood bank blood. Transfusion therapy is the safest it has ever been in the practice of medicine. Before blood is taken from a donor, a complete history is taken to make sure that person has no history of diseases nor engages in risky social behavior (examples are intravenous drug use or sexual activity with multiple partners). The donor's travel history is screened to minimize  risk of transmitting infections, such as malaria. The donated blood is tested for  signs of infectious diseases, such as HIV and hepatitis. The blood is then tested to be sure it is compatible with you in order to minimize the chance of a transfusion reaction. If you or a relative donates blood, this is often done in anticipation of surgery and is not appropriate for emergency situations. It takes many days to process the donated blood. RISKS AND COMPLICATIONS Although transfusion therapy is very safe and saves many lives, the main dangers of transfusion include:   Getting an infectious disease.  Developing a transfusion reaction. This is an allergic reaction to something in the blood you were given. Every precaution is taken to prevent this. The decision to have a blood transfusion has been considered carefully by your caregiver before blood is given. Blood is not given unless the benefits outweigh the risks. AFTER THE TRANSFUSION  Right after receiving a blood transfusion, you will usually feel much better and more energetic. This is especially true if your red blood cells have gotten low (anemic). The transfusion raises the level of the red blood cells which carry oxygen, and this usually causes an energy increase.  The nurse administering the transfusion will monitor you carefully for complications. HOME CARE INSTRUCTIONS  No special instructions are needed after a transfusion. You may find your energy is better. Speak with your caregiver about any limitations on activity for underlying diseases you may have. SEEK MEDICAL CARE IF:   Your condition is not improving after your transfusion.  You develop redness or irritation at the intravenous (IV) site. SEEK IMMEDIATE MEDICAL CARE IF:  Any of the following symptoms occur over the next 12 hours:  Shaking chills.  You have a temperature by mouth above 102 F (38.9 C), not controlled by medicine.  Chest, back, or muscle pain.  People around you feel you are not acting correctly or are confused.  Shortness of breath  or difficulty breathing.  Dizziness and fainting.  You get a rash or develop hives.  You have a decrease in urine output.  Your urine turns a dark color or changes to pink, red, or brown. Any of the following symptoms occur over the next 10 days:  You have a temperature by mouth above 102 F (38.9 C), not controlled by medicine.  Shortness of breath.  Weakness after normal activity.  The white part of the eye turns yellow (jaundice).  You have a decrease in the amount of urine or are urinating less often.  Your urine turns a dark color or changes to pink, red, or brown. Document Released: 06/16/2000 Document Revised: 09/11/2011 Document Reviewed: 02/03/2008 [facility] Patient Information 2014 [place], [place].  _______________________________________________________________________

## 2018-12-03 ENCOUNTER — Encounter (HOSPITAL_COMMUNITY)
Admission: RE | Admit: 2018-12-03 | Discharge: 2018-12-03 | Disposition: A | Discharge status: Home / Self Care | Payer: Medicare Other | Source: Ambulatory Visit | Attending: Orthopedic Surgery | Admitting: Orthopedic Surgery

## 2018-12-03 ENCOUNTER — Encounter (HOSPITAL_COMMUNITY): Payer: Self-pay

## 2018-12-03 ENCOUNTER — Other Ambulatory Visit: Payer: Self-pay

## 2018-12-03 DIAGNOSIS — Z1159 Encounter for screening for other viral diseases: Secondary | ICD-10-CM | POA: Insufficient documentation

## 2018-12-03 DIAGNOSIS — Z01812 Encounter for preprocedural laboratory examination: Secondary | ICD-10-CM | POA: Insufficient documentation

## 2018-12-03 HISTORY — DX: Cardiac murmur, unspecified: R01.1

## 2018-12-03 HISTORY — DX: Prediabetes: R73.03

## 2018-12-03 LAB — BASIC METABOLIC PANEL
Anion gap: 10 (ref 5–15)
BUN: 20 mg/dL (ref 8–23)
CO2: 26 mmol/L (ref 22–32)
Calcium: 9.6 mg/dL (ref 8.9–10.3)
Chloride: 106 mmol/L (ref 98–111)
Creatinine, Ser: 1.06 mg/dL — ABNORMAL HIGH (ref 0.44–1.00)
GFR calc Af Amer: 58 mL/min — ABNORMAL LOW (ref >60)
GFR calc non Af Amer: 50 mL/min — ABNORMAL LOW (ref >60)
Glucose, Bld: 97 mg/dL (ref 70–99)
Potassium: 4.7 mmol/L (ref 3.5–5.1)
Sodium: 142 mmol/L (ref 135–145)

## 2018-12-03 LAB — CBC
HCT: 38.7 % (ref 36.0–46.0)
Hemoglobin: 12.1 g/dL (ref 12.0–15.0)
MCH: 31.4 pg (ref 26.0–34.0)
MCHC: 31.3 g/dL (ref 30.0–36.0)
MCV: 100.5 fL — ABNORMAL HIGH (ref 80.0–100.0)
Platelets: 299 10*3/uL (ref 150–400)
RBC: 3.85 MIL/uL — ABNORMAL LOW (ref 3.87–5.11)
RDW: 12.9 % (ref 11.5–15.5)
WBC: 8.1 10*3/uL (ref 4.0–10.5)
nRBC: 0 % (ref 0.0–0.2)

## 2018-12-03 LAB — SURGICAL PCR SCREEN
MRSA, PCR: NEGATIVE
Staphylococcus aureus: NEGATIVE

## 2018-12-04 NOTE — H&P (Signed)
TOTAL HIP ADMISSION H&P  Patient is admitted for right total hip arthroplasty, anterior approach.  Subjective:  Chief Complaint:     Right hip primary OA / pain  HPI: Isabella Valencia, 78 y.o. female, has a history of pain and functional disability in the right hip(s) due to arthritis and patient has failed non-surgical conservative treatments for greater than 12 weeks to include NSAID's and/or analgesics, corticosteriod injections, use of assistive devices and activity modification.  Onset of symptoms was abrupt starting in November 2019  with gradually worsening course since that time.The patient noted no past surgery on the right hip(s).  Patient currently rates pain in the right hip at 8 out of 10 with activity. Patient has night pain, worsening of pain with activity and weight bearing, trendelenberg gait, pain that interfers with activities of daily living and pain with passive range of motion. Patient has evidence of periarticular osteophytes and joint space narrowing by imaging studies. This condition presents safety issues increasing the risk of falls.  There is no current active infection.  Risks, benefits and expectations were discussed with the patient.  Risks including but not limited to the risk of anesthesia, blood clots, nerve damage, blood vessel damage, failure of the prosthesis, infection and up to and including death.  Patient understand the risks, benefits and expectations and wishes to proceed with surgery.   PCP: Binnie Rail, MD  D/C Plans:       Home (daughter's)  Post-op Meds:       No Rx given   Tranexamic Acid:      To be given - IV   Decadron:      Is to be given  FYI:      ASA  Norco  DME:   Pt already has equipment   PT:    No PT   Pharmacy:    [facility] [place]   Patient Active Problem List   Diagnosis Date Noted  . Hyperglycemia 11/22/2018  . Pre-op evaluation 11/22/2018  . Osteoarthritis of right hip 08/30/2018  . Mitral regurgitation 02/26/2016  .  Overweight (BMI 25.0-29.9) 10/08/2014  . 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 S/P knee replacement 10/06/2014  . History of anesthesia reaction 08/20/2014  . RBBB (right bundle branch block) 05/13/2014  . Hyperlipidemia 11/04/2008  . DRY EYE SYNDROME 11/04/2008  . Intermittent asthma without complication 46/56/8127   Past Medical History:  Diagnosis Date  . Arthritis 2004   neg ANA & RA  . Asthma    minimal problems  . Cervical disc disease   . Complication of anesthesia    reports had toruble waking after general anes for hysterectomy.  no troubles with anesthesia with the R TKR in 2016   . Difficulty sleeping    due to knee pain  . Heart murmur   . History of skin cancer   . Hyperlipidemia   . MVP (mitral valve prolapse)    reports this is the cause of her murmur. denies any cardiac sx related to  MVP . does not have a cardiologist . reporst she gets her annual check ups of her heart at her pcp office   . Pre-diabetes    hgba1c 11-22-2018 5.8%, patient states that she feels more like her blood sugar drops than it being high , gets dizzy idf she doesnt eat     Past Surgical History:  Procedure Laterality Date  . ABDOMINAL HYSTERECTOMY    . BREAST ENHANCEMENT SURGERY Bilateral   .  CATARACT EXTRACTION    . COLONOSCOPY     every 5 years ; Dr Earlean Shawl  . cosmetic eye surgery    . ELBOW SURGERY      X3  . KNEE ARTHROSCOPY     rt knee  . TOTAL KNEE ARTHROPLASTY Right 10/06/2014   Procedure: RIGHT TOTAL KNEE ARTHROPLASTY;  Surgeon: Paralee Cancel, MD;  Location: [facility];  Service: Orthopedics;  Laterality: Right;  . TUBAL LIGATION      No current facility-administered medications for this encounter.    Current Outpatient Medications  Medication Sig Dispense Refill Last Dose  . aspirin EC 81 MG tablet Take 162 mg by mouth daily as needed for moderate pain.     . Calcium Citrate-Vitamin D (CALCIUM + D PO) Take 1 tablet by mouth daily.   Taking  . diclofenac sodium (VOLTAREN) 1 % GEL Apply 1  application topically daily as needed (knee pain).     . Multiple Vitamin (ONE-A-DAY ESSENTIAL) TABS Take 1 tablet by mouth every morning.    Taking  . naproxen sodium (ALEVE) 220 MG tablet Take 220 mg by mouth daily as needed (pain).     . traMADol (ULTRAM) 50 MG tablet Take 50 mg by mouth at bedtime as needed (pain).     . Turmeric Curcumin 500 MG CAPS Takes one daily (Patient taking differently: Take 500 mg by mouth daily. Takes one daily)      Allergies  Allergen Reactions  . Triamcinolone Acetonide     Soft tissue atrophy with injection of Kenalog  . Adhesive [Tape] Rash    Tears skin, Please use "paper" tape    Social History   Tobacco Use  . Smoking status: Never Smoker  . Smokeless tobacco: Never Used  Substance Use Topics  . Alcohol use: Not Currently    Alcohol/week: 0.0 standard drinks    Comment:  wine socially;  none since fall 2019    Family History  Problem Relation Age of Onset  . Arthritis Mother   . Colon cancer Mother   . Stroke Father   . Colon cancer Maternal Aunt   . Colon cancer Maternal Grandmother   . Colon polyps Brother      Review of Systems  Constitutional: Negative.   HENT: Negative.   Eyes: Negative.   Respiratory: Negative.   Cardiovascular: Negative.   Gastrointestinal: Negative.   Genitourinary: Negative.   Musculoskeletal: Positive for joint pain.  Skin: Negative.   Neurological: Negative.   Endo/Heme/Allergies: Negative.   Psychiatric/Behavioral: Negative.     Objective:  Physical Exam  Constitutional: She is oriented to person, place, and time. She appears well-developed.  HENT:  Head: Normocephalic.  Eyes: Pupils are equal, round, and reactive to light.  Neck: Neck supple. No JVD present. No tracheal deviation present. No thyromegaly present.  Cardiovascular: Normal rate, regular rhythm and intact distal pulses.  Respiratory: Effort normal and breath sounds normal. No respiratory distress. She has no wheezes.  GI: Soft.  There is no abdominal tenderness. There is no guarding.  Musculoskeletal:     Right hip: She exhibits decreased range of motion, decreased strength, tenderness and bony tenderness. She exhibits no swelling, no deformity and no laceration.  Lymphadenopathy:    She has no cervical adenopathy.  Neurological: She is alert and oriented to person, place, and time.  Skin: Skin is warm and dry.  Psychiatric: She has a normal mood and affect.      Labs:  Estimated body mass index is  27.15 kg/m as calculated from the following:   Height as of 12/03/18: 5\' 4"  (1.626 m).   Weight as of 12/03/18: 71.8 kg.   Imaging Review Plain radiographs demonstrate severe degenerative joint disease of the right hip. The bone quality appears to be good for age and reported activity level.      Assessment/Plan:  End stage arthritis, right hip(s)  The patient history, physical examination, clinical judgement of the provider and imaging studies are consistent with end stage degenerative joint disease of the right hip(s) and total hip arthroplasty is deemed medically necessary. The treatment options including medical management, injection therapy, arthroscopy and arthroplasty were discussed at length. The risks and benefits of total hip arthroplasty were presented and reviewed. The risks due to aseptic loosening, infection, stiffness, dislocation/subluxation,  thromboembolic complications and other imponderables were discussed.  The patient acknowledged the explanation, agreed to proceed with the plan and consent was signed. Patient is being admitted for inpatient treatment for surgery, pain control, PT, OT, prophylactic antibiotics, VTE prophylaxis, progressive ambulation and ADL's and discharge planning.The patient is planning to be discharged home.      West Pugh Mayjor Ager   PA-C  12/04/2018, 9:38 PM

## 2018-12-05 NOTE — Anesthesia Preprocedure Evaluation (Addendum)
Anesthesia Evaluation  Patient identified by MRN, date of birth, ID band Patient awake    Reviewed: Allergy & Precautions, NPO status , Patient's Chart, lab work & pertinent test results  Airway Mallampati: III  TM Distance: >3 FB Neck ROM: Full    Dental no notable dental hx. (+) Teeth Intact, Dental Advisory Given   Pulmonary asthma ,    Pulmonary exam normal breath sounds clear to auscultation       Cardiovascular Normal cardiovascular exam+ Valvular Problems/Murmurs MVP and MR  Rhythm:Regular Rate:Normal  EKG: 02/27/16 Rate 72 bpm Sinus rhythm  Short PR interval  Right bundle branch block  Inferolateral infarct, age undetermined  CV: Echo 02/28/2016 - Left ventricle: The cavity size was normal. Systolic function wasnormal. Wall motion was normal; there were no regional wallmotion abnormalities. - Aortic valve: Mildly calcified annulus. Trileaflet; normal thickness leaflets. - Mitral valve: Mildly calcified annulus. There was mild to moderate regurgitation.   Neuro/Psych negative neurological ROS  negative psych ROS   GI/Hepatic negative GI ROS, Neg liver ROS,   Endo/Other  negative endocrine ROS  Renal/GU negative Renal ROS  negative genitourinary   Musculoskeletal  (+) Arthritis ,   Abdominal   Peds  Hematology negative hematology ROS (+)   Anesthesia Other Findings   Reproductive/Obstetrics                            Anesthesia Physical Anesthesia Plan  ASA: III  Anesthesia Plan: Spinal   Post-op Pain Management:    Induction:   PONV Risk Score and Plan: Treatment may vary due to age or medical condition  Airway Management Planned: Natural Airway  Additional Equipment:   Intra-op Plan:   Post-operative Plan:   Informed Consent: I have reviewed the patients History and Physical, chart, labs and discussed the procedure including the risks, benefits and  alternatives for the proposed anesthesia with the patient or authorized representative who has indicated his/her understanding and acceptance.     Dental advisory given  Plan Discussed with: CRNA  Anesthesia Plan Comments:       Anesthesia Quick Evaluation

## 2018-12-05 NOTE — Progress Notes (Signed)
Anesthesia Chart Review   Case:  [id number] Date/Time:  12/10/18 0950   Procedure:  TOTAL HIP ARTHROPLASTY ANTERIOR APPROACH (Right ) - 70 mins   Anesthesia type:  Spinal   Pre-op diagnosis:  Right hip osteoarthritis   Location:  [place] [facility]   Surgeon:  Paralee Cancel, MD      DISCUSSION: 78 yo never smoker with h/o asymptomatic mild-moderate MR, HLD, asthma, pre-diabetes, right hip OA scheduled for above procedure 12/10/2018 with Dr. Paralee Cancel.   Pt last seen by PCP, Dr. Billey Gosling, 11/22/2018.  Per her OV note, "Had difficulty waking her up after a couple of different surgeries.  Was advised that she needs a low dose.  Will discuss in her preoperative visit at the hospital.  Her surgeon is also aware.  No history suggestive of allergic reactions for her or her family.  Medically stable.  No heart disease or COPD.  Low risk for low risk procedure-medically cleared"  Pt can proceed with planned procedure barring acute status change.  VS: BP 117/80   Pulse 88   Temp 36.9 C (Oral)   Resp 16   Ht 5\' 4"  (1.626 m)   Wt 71.8 kg   SpO2 98%   BMI 27.15 kg/m   PROVIDERS: Binnie Rail, MD is PCP    LABS: Labs reviewed: Acceptable for surgery. (all labs ordered are listed, but only abnormal results are displayed)  Labs Reviewed  BASIC METABOLIC PANEL - Abnormal; Notable for the following components:      Result Value   Creatinine, Ser 1.06 (*)    GFR calc non Af Amer 50 (*)    GFR calc Af Amer 58 (*)    All other components within normal limits  CBC - Abnormal; Notable for the following components:   RBC 3.85 (*)    MCV 100.5 (*)    All other components within normal limits  SURGICAL PCR SCREEN  TYPE AND SCREEN     IMAGES:   EKG: 02/27/16 Rate 72 bpm Sinus rhythm  Short PR interval  Right bundle branch block  Inferolateral infarct, age undetermined  CV: Echo 02/28/2016 Study Conclusions  - Left ventricle: The cavity size was normal. Systolic function was   normal. Wall motion was normal; there were no regional wall   motion abnormalities. - Aortic valve: Mildly calcified annulus. Trileaflet; normal   thickness leaflets. - Mitral valve: Mildly calcified annulus. There was mild to   moderate regurgitation. Past Medical History:  Diagnosis Date  . Arthritis 2004   neg ANA & RA  . Asthma    minimal problems  . Cervical disc disease   . Complication of anesthesia    reports had toruble waking after general anes for hysterectomy.  no troubles with anesthesia with the R TKR in 2016   . Difficulty sleeping    due to knee pain  . Heart murmur   . History of skin cancer   . Hyperlipidemia   . MVP (mitral valve prolapse)    reports this is the cause of her murmur. denies any cardiac sx related to  MVP . does not have a cardiologist . reporst she gets her annual check ups of her heart at her pcp office   . Pre-diabetes    hgba1c 11-22-2018 5.8%, patient states that she feels more like her blood sugar drops than it being high , gets dizzy idf she doesnt eat     Past Surgical History:  Procedure  Laterality Date  . ABDOMINAL HYSTERECTOMY    . BREAST ENHANCEMENT SURGERY Bilateral   . CATARACT EXTRACTION    . COLONOSCOPY     every 5 years ; Dr Earlean Shawl  . cosmetic eye surgery    . ELBOW SURGERY      X3  . KNEE ARTHROSCOPY     rt knee  . TOTAL KNEE ARTHROPLASTY Right 10/06/2014   Procedure: RIGHT TOTAL KNEE ARTHROPLASTY;  Surgeon: Paralee Cancel, MD;  Location: [facility];  Service: Orthopedics;  Laterality: Right;  . TUBAL LIGATION      MEDICATIONS: . aspirin EC 81 MG tablet  . Calcium Citrate-Vitamin D (CALCIUM + D PO)  . diclofenac sodium (VOLTAREN) 1 % GEL  . Multiple Vitamin (ONE-A-DAY ESSENTIAL) TABS  . naproxen sodium (ALEVE) 220 MG tablet  . traMADol (ULTRAM) 50 MG tablet  . Turmeric Curcumin 500 MG CAPS   No current facility-administered medications for this encounter.     Maia Plan [facility]Surgical [facility] [phone] 12/05/18 11:17 AM

## 2018-12-06 ENCOUNTER — Other Ambulatory Visit: Payer: Self-pay

## 2018-12-06 ENCOUNTER — Other Ambulatory Visit (HOSPITAL_COMMUNITY)
Admission: RE | Admit: 2018-12-06 | Discharge: 2018-12-06 | Disposition: A | Discharge status: Home / Self Care | Payer: Medicare Other | Source: Ambulatory Visit | Attending: Orthopedic Surgery | Admitting: Orthopedic Surgery

## 2018-12-06 DIAGNOSIS — Z1159 Encounter for screening for other viral diseases: Secondary | ICD-10-CM | POA: Diagnosis not present

## 2018-12-06 DIAGNOSIS — Z01812 Encounter for preprocedural laboratory examination: Secondary | ICD-10-CM | POA: Diagnosis not present

## 2018-12-07 LAB — NOVEL CORONAVIRUS, NAA (HOSP ORDER, SEND-OUT TO REF LAB; TAT 18-24 HRS): SARS-CoV-2, NAA: NOT DETECTED

## 2018-12-09 NOTE — Progress Notes (Signed)
SPOKE W/  Patient via whone     SCREENING SYMPTOMS OF COVID 19:   COUGH--no  RUNNY NOSE--- no  SORE THROAT---no  NASAL CONGESTION----no  SNEEZING----no  SHORTNESS OF BREATH---no  DIFFICULTY BREATHING---no  TEMP >100.0 -----no  UNEXPLAINED BODY ACHES------no  CHILLS -------- no  HEADACHES ---------no  LOSS OF SMELL/ TASTE --------no    HAVE YOU OR ANY FAMILY MEMBER TRAVELLED PAST 14 DAYS OUT OF THE   COUNTY---yes (lives in [place] and travels to [place] for MD appts) STATE----no COUNTRY----no  HAVE YOU OR ANY FAMILY MEMBER BEEN EXPOSED TO ANYONE WITH COVID 19? no

## 2018-12-10 ENCOUNTER — Inpatient Hospital Stay (HOSPITAL_COMMUNITY): Payer: Medicare Other

## 2018-12-10 ENCOUNTER — Encounter (HOSPITAL_COMMUNITY): Payer: Self-pay

## 2018-12-10 ENCOUNTER — Inpatient Hospital Stay (HOSPITAL_COMMUNITY): Payer: Medicare Other | Admitting: Physician Assistant

## 2018-12-10 ENCOUNTER — Other Ambulatory Visit: Payer: Self-pay

## 2018-12-10 ENCOUNTER — Inpatient Hospital Stay (HOSPITAL_COMMUNITY): Payer: Medicare Other | Admitting: Anesthesiology

## 2018-12-10 ENCOUNTER — Inpatient Hospital Stay (HOSPITAL_COMMUNITY)
Admission: RE | Admit: 2018-12-10 | Discharge: 2018-12-12 | DRG: 470 | Disposition: A | Discharge status: Home Health | Payer: Medicare Other | Attending: Orthopedic Surgery | Admitting: Orthopedic Surgery

## 2018-12-10 ENCOUNTER — Encounter (HOSPITAL_COMMUNITY)
Admission: RE | Disposition: A | Discharge status: Home Health | Payer: Self-pay | Source: Home / Self Care | Attending: Orthopedic Surgery

## 2018-12-10 DIAGNOSIS — E663 Overweight: Secondary | ICD-10-CM | POA: Diagnosis present

## 2018-12-10 DIAGNOSIS — I340 Nonrheumatic mitral (valve) insufficiency: Secondary | ICD-10-CM | POA: Diagnosis present

## 2018-12-10 DIAGNOSIS — Z96641 Presence of right artificial hip joint: Secondary | ICD-10-CM | POA: Diagnosis not present

## 2018-12-10 DIAGNOSIS — Z7982 Long term (current) use of aspirin: Secondary | ICD-10-CM

## 2018-12-10 DIAGNOSIS — I341 Nonrheumatic mitral (valve) prolapse: Secondary | ICD-10-CM | POA: Diagnosis not present

## 2018-12-10 DIAGNOSIS — Z79891 Long term (current) use of opiate analgesic: Secondary | ICD-10-CM

## 2018-12-10 DIAGNOSIS — Z888 Allergy status to other drugs, medicaments and biological substances status: Secondary | ICD-10-CM

## 2018-12-10 DIAGNOSIS — Z9181 History of falling: Secondary | ICD-10-CM | POA: Diagnosis not present

## 2018-12-10 DIAGNOSIS — Z6827 Body mass index (BMI) 27.0-27.9, adult: Secondary | ICD-10-CM | POA: Diagnosis not present

## 2018-12-10 DIAGNOSIS — Z471 Aftercare following joint replacement surgery: Secondary | ICD-10-CM | POA: Diagnosis not present

## 2018-12-10 DIAGNOSIS — Z419 Encounter for procedure for purposes other than remedying health state, unspecified: Secondary | ICD-10-CM

## 2018-12-10 DIAGNOSIS — Z91048 Other nonmedicinal substance allergy status: Secondary | ICD-10-CM

## 2018-12-10 DIAGNOSIS — E785 Hyperlipidemia, unspecified: Secondary | ICD-10-CM | POA: Diagnosis not present

## 2018-12-10 DIAGNOSIS — I4510 Unspecified right bundle-branch block: Secondary | ICD-10-CM | POA: Diagnosis not present

## 2018-12-10 DIAGNOSIS — Z96649 Presence of unspecified artificial hip joint: Secondary | ICD-10-CM

## 2018-12-10 DIAGNOSIS — Z96651 Presence of right artificial knee joint: Secondary | ICD-10-CM | POA: Diagnosis not present

## 2018-12-10 DIAGNOSIS — M1611 Unilateral primary osteoarthritis, right hip: Secondary | ICD-10-CM | POA: Diagnosis not present

## 2018-12-10 DIAGNOSIS — Z791 Long term (current) use of non-steroidal anti-inflammatories (NSAID): Secondary | ICD-10-CM | POA: Diagnosis not present

## 2018-12-10 DIAGNOSIS — M25751 Osteophyte, right hip: Secondary | ICD-10-CM | POA: Diagnosis present

## 2018-12-10 DIAGNOSIS — Z8261 Family history of arthritis: Secondary | ICD-10-CM | POA: Diagnosis not present

## 2018-12-10 DIAGNOSIS — Z79899 Other long term (current) drug therapy: Secondary | ICD-10-CM | POA: Diagnosis not present

## 2018-12-10 HISTORY — PX: TOTAL HIP ARTHROPLASTY: SHX124

## 2018-12-10 LAB — TYPE AND SCREEN
ABO/RH(D): A NEG
Antibody Screen: NEGATIVE

## 2018-12-10 SURGERY — ARTHROPLASTY, HIP, TOTAL, ANTERIOR APPROACH
Anesthesia: Spinal | Laterality: Right

## 2018-12-10 MED ORDER — ONDANSETRON HCL 4 MG/2ML IJ SOLN
INTRAMUSCULAR | Status: AC
  Filled 2018-12-10: qty 6

## 2018-12-10 MED ORDER — PHENOL 1.4 % MT LIQD: 1.0000 | OROMUCOSAL | Status: DC | PRN

## 2018-12-10 MED ORDER — DEXAMETHASONE SODIUM PHOSPHATE 10 MG/ML IJ SOLN: 10.0000 mg | Freq: Once | INTRAMUSCULAR | Status: DC

## 2018-12-10 MED ORDER — DOCUSATE SODIUM 100 MG PO CAPS: 100.0000 mg | ORAL_CAPSULE | Freq: Two times a day (BID) | ORAL | 0 refills | Status: DC

## 2018-12-10 MED ORDER — STERILE WATER FOR IRRIGATION IR SOLN
Status: DC | PRN
  Administered 2018-12-10 (×2): 1000 mL

## 2018-12-10 MED ORDER — DEXAMETHASONE SODIUM PHOSPHATE 10 MG/ML IJ SOLN
INTRAMUSCULAR | Status: AC
  Filled 2018-12-10: qty 3

## 2018-12-10 MED ORDER — BUPIVACAINE IN DEXTROSE 0.75-8.25 % IT SOLN
INTRATHECAL | Status: DC | PRN
  Administered 2018-12-10: 1.6 mL via INTRATHECAL

## 2018-12-10 MED ORDER — DEXAMETHASONE SODIUM PHOSPHATE 10 MG/ML IJ SOLN
INTRAMUSCULAR | Status: DC | PRN
  Administered 2018-12-10: 6 mg via INTRAVENOUS

## 2018-12-10 MED ORDER — FERROUS SULFATE 325 (65 FE) MG PO TABS
325.0000 mg | ORAL_TABLET | Freq: Three times a day (TID) | ORAL | Status: DC
  Administered 2018-12-10 – 2018-12-12 (×6): 325 mg via ORAL
  Filled 2018-12-10 (×6): qty 1

## 2018-12-10 MED ORDER — SODIUM CHLORIDE 0.9 % IV SOLN
INTRAVENOUS | Status: DC
  Administered 2018-12-10 – 2018-12-11 (×3): via INTRAVENOUS

## 2018-12-10 MED ORDER — PHENYLEPHRINE HCL (PRESSORS) 10 MG/ML IV SOLN
INTRAVENOUS | Status: AC
  Filled 2018-12-10: qty 1

## 2018-12-10 MED ORDER — ACETAMINOPHEN 500 MG PO TABS
ORAL_TABLET | ORAL | Status: AC
  Filled 2018-12-10: qty 2

## 2018-12-10 MED ORDER — PROPOFOL 10 MG/ML IV BOLUS
INTRAVENOUS | Status: DC | PRN
  Administered 2018-12-10: 20 mg via INTRAVENOUS
  Administered 2018-12-10: 10 mg via INTRAVENOUS

## 2018-12-10 MED ORDER — DEXAMETHASONE SODIUM PHOSPHATE 10 MG/ML IJ SOLN
10.0000 mg | Freq: Once | INTRAMUSCULAR | Status: AC
  Administered 2018-12-11: 06:00:00 10 mg via INTRAVENOUS
  Filled 2018-12-10: qty 1

## 2018-12-10 MED ORDER — ACETAMINOPHEN 325 MG PO TABS: 325.0000 mg | ORAL_TABLET | Freq: Four times a day (QID) | ORAL | Status: DC | PRN

## 2018-12-10 MED ORDER — FENTANYL CITRATE (PF) 100 MCG/2ML IJ SOLN
INTRAMUSCULAR | Status: AC
  Filled 2018-12-10: qty 2

## 2018-12-10 MED ORDER — HYDROMORPHONE HCL 1 MG/ML IJ SOLN
0.5000 mg | INTRAMUSCULAR | Status: DC | PRN
  Administered 2018-12-10 – 2018-12-11 (×2): 0.5 mg via INTRAVENOUS
  Filled 2018-12-10 (×2): qty 1

## 2018-12-10 MED ORDER — EPHEDRINE 5 MG/ML INJ
INTRAVENOUS | Status: AC
  Filled 2018-12-10: qty 10

## 2018-12-10 MED ORDER — CEFAZOLIN SODIUM-DEXTROSE 2-4 GM/100ML-% IV SOLN
INTRAVENOUS | Status: AC
  Filled 2018-12-10: qty 100

## 2018-12-10 MED ORDER — METOCLOPRAMIDE HCL 5 MG/ML IJ SOLN: 5.0000 mg | Freq: Three times a day (TID) | INTRAMUSCULAR | Status: DC | PRN

## 2018-12-10 MED ORDER — TRANEXAMIC ACID-NACL 1000-0.7 MG/100ML-% IV SOLN
1000.0000 mg | Freq: Once | INTRAVENOUS | Status: AC
  Administered 2018-12-10: 12:00:00 1000 mg via INTRAVENOUS
  Filled 2018-12-10: qty 100

## 2018-12-10 MED ORDER — ONDANSETRON HCL 4 MG PO TABS: 4.0000 mg | ORAL_TABLET | Freq: Four times a day (QID) | ORAL | Status: DC | PRN

## 2018-12-10 MED ORDER — METHOCARBAMOL 500 MG PO TABS: 500.0000 mg | ORAL_TABLET | Freq: Four times a day (QID) | ORAL | 0 refills | Status: DC | PRN

## 2018-12-10 MED ORDER — MIDAZOLAM HCL 5 MG/5ML IJ SOLN
INTRAMUSCULAR | Status: DC | PRN
  Administered 2018-12-10: 2 mg via INTRAVENOUS

## 2018-12-10 MED ORDER — CEFAZOLIN SODIUM-DEXTROSE 2-4 GM/100ML-% IV SOLN
2.0000 g | Freq: Four times a day (QID) | INTRAVENOUS | Status: AC
  Administered 2018-12-10 (×2): 2 g via INTRAVENOUS
  Filled 2018-12-10 (×2): qty 100

## 2018-12-10 MED ORDER — FENTANYL CITRATE (PF) 100 MCG/2ML IJ SOLN
25.0000 ug | INTRAMUSCULAR | Status: DC | PRN
  Administered 2018-12-10 (×3): 50 ug via INTRAVENOUS

## 2018-12-10 MED ORDER — ONDANSETRON HCL 4 MG/2ML IJ SOLN: 4.0000 mg | Freq: Four times a day (QID) | INTRAMUSCULAR | Status: DC | PRN

## 2018-12-10 MED ORDER — PHENYLEPHRINE 40 MCG/ML (10ML) SYRINGE FOR IV PUSH (FOR BLOOD PRESSURE SUPPORT)
PREFILLED_SYRINGE | INTRAVENOUS | Status: DC | PRN
  Administered 2018-12-10 (×6): 80 ug via INTRAVENOUS

## 2018-12-10 MED ORDER — BISACODYL 10 MG RE SUPP: 10.0000 mg | Freq: Every day | RECTAL | Status: DC | PRN

## 2018-12-10 MED ORDER — FERROUS SULFATE 325 (65 FE) MG PO TABS: 325.0000 mg | ORAL_TABLET | Freq: Three times a day (TID) | ORAL | 3 refills | Status: DC

## 2018-12-10 MED ORDER — MAGNESIUM CITRATE PO SOLN: 1.0000 | Freq: Once | ORAL | Status: DC | PRN

## 2018-12-10 MED ORDER — HYDROCODONE-ACETAMINOPHEN 7.5-325 MG PO TABS: 1.0000 | ORAL_TABLET | ORAL | 0 refills | Status: DC | PRN

## 2018-12-10 MED ORDER — ACETAMINOPHEN 500 MG PO TABS
1000.0000 mg | ORAL_TABLET | Freq: Once | ORAL | Status: AC
  Administered 2018-12-10: 06:00:00 1000 mg via ORAL

## 2018-12-10 MED ORDER — POLYETHYLENE GLYCOL 3350 17 G PO PACK
17.0000 g | PACK | Freq: Two times a day (BID) | ORAL | Status: DC
  Administered 2018-12-10 – 2018-12-12 (×3): 17 g via ORAL
  Filled 2018-12-10 (×4): qty 1

## 2018-12-10 MED ORDER — FENTANYL CITRATE (PF) 100 MCG/2ML IJ SOLN
INTRAMUSCULAR | Status: DC | PRN
  Administered 2018-12-10 (×2): 50 ug via INTRAVENOUS

## 2018-12-10 MED ORDER — CEFAZOLIN SODIUM-DEXTROSE 2-4 GM/100ML-% IV SOLN
2.0000 g | INTRAVENOUS | Status: AC
  Administered 2018-12-10: 07:00:00 2 g via INTRAVENOUS

## 2018-12-10 MED ORDER — TRANEXAMIC ACID-NACL 1000-0.7 MG/100ML-% IV SOLN
1000.0000 mg | INTRAVENOUS | Status: AC
  Administered 2018-12-10: 07:00:00 1000 mg via INTRAVENOUS

## 2018-12-10 MED ORDER — SODIUM CHLORIDE 0.9 % IR SOLN
Status: DC | PRN
  Administered 2018-12-10: 1000 mL

## 2018-12-10 MED ORDER — HYDROCODONE-ACETAMINOPHEN 5-325 MG PO TABS
ORAL_TABLET | ORAL | Status: AC
  Filled 2018-12-10: qty 1

## 2018-12-10 MED ORDER — MORPHINE SULFATE (PF) 2 MG/ML IV SOLN
0.5000 mg | INTRAVENOUS | Status: DC | PRN
  Filled 2018-12-10 (×2): qty 1

## 2018-12-10 MED ORDER — ASPIRIN 81 MG PO CHEW: 81.0000 mg | CHEWABLE_TABLET | Freq: Two times a day (BID) | ORAL | 0 refills | Status: AC

## 2018-12-10 MED ORDER — PROPOFOL 10 MG/ML IV BOLUS
INTRAVENOUS | Status: AC
  Filled 2018-12-10: qty 80

## 2018-12-10 MED ORDER — METHOCARBAMOL 500 MG PO TABS
500.0000 mg | ORAL_TABLET | Freq: Four times a day (QID) | ORAL | Status: DC | PRN
  Administered 2018-12-10 – 2018-12-12 (×3): 500 mg via ORAL
  Filled 2018-12-10 (×3): qty 1

## 2018-12-10 MED ORDER — METHOCARBAMOL 500 MG IVPB - SIMPLE MED
INTRAVENOUS | Status: AC
  Filled 2018-12-10: qty 50

## 2018-12-10 MED ORDER — MENTHOL 3 MG MT LOZG: 1.0000 | LOZENGE | OROMUCOSAL | Status: DC | PRN

## 2018-12-10 MED ORDER — EPHEDRINE SULFATE-NACL 50-0.9 MG/10ML-% IV SOSY
PREFILLED_SYRINGE | INTRAVENOUS | Status: DC | PRN
  Administered 2018-12-10: 5 mg via INTRAVENOUS

## 2018-12-10 MED ORDER — ALUM & MAG HYDROXIDE-SIMETH 200-200-20 MG/5ML PO SUSP: 15.0000 mL | ORAL | Status: DC | PRN

## 2018-12-10 MED ORDER — PHENYLEPHRINE 40 MCG/ML (10ML) SYRINGE FOR IV PUSH (FOR BLOOD PRESSURE SUPPORT)
PREFILLED_SYRINGE | INTRAVENOUS | Status: AC
  Filled 2018-12-10: qty 10

## 2018-12-10 MED ORDER — POLYETHYLENE GLYCOL 3350 17 G PO PACK: 17.0000 g | PACK | Freq: Two times a day (BID) | ORAL | 0 refills | Status: DC

## 2018-12-10 MED ORDER — PROPOFOL 500 MG/50ML IV EMUL
INTRAVENOUS | Status: DC | PRN
  Administered 2018-12-10: 55 ug/kg/min via INTRAVENOUS

## 2018-12-10 MED ORDER — CHLORHEXIDINE GLUCONATE 4 % EX LIQD: 60.0000 mL | Freq: Once | CUTANEOUS | Status: DC

## 2018-12-10 MED ORDER — TRANEXAMIC ACID-NACL 1000-0.7 MG/100ML-% IV SOLN
INTRAVENOUS | Status: AC
  Filled 2018-12-10: qty 100

## 2018-12-10 MED ORDER — HYDROCODONE-ACETAMINOPHEN 5-325 MG PO TABS
1.0000 | ORAL_TABLET | ORAL | Status: DC | PRN
  Administered 2018-12-10: 19:00:00 2 via ORAL
  Administered 2018-12-10: 1 via ORAL
  Administered 2018-12-10 – 2018-12-11 (×3): 2 via ORAL
  Administered 2018-12-11: 1 via ORAL
  Administered 2018-12-11: 2 via ORAL
  Administered 2018-12-11: 1 via ORAL
  Administered 2018-12-12 (×4): 2 via ORAL
  Filled 2018-12-10 (×10): qty 2

## 2018-12-10 MED ORDER — LACTATED RINGERS IV SOLN
INTRAVENOUS | Status: DC | PRN
  Administered 2018-12-10 (×2): via INTRAVENOUS

## 2018-12-10 MED ORDER — LACTATED RINGERS IV SOLN
INTRAVENOUS | Status: DC
  Administered 2018-12-10: 06:00:00 via INTRAVENOUS

## 2018-12-10 MED ORDER — DOCUSATE SODIUM 100 MG PO CAPS
100.0000 mg | ORAL_CAPSULE | Freq: Two times a day (BID) | ORAL | Status: DC
  Administered 2018-12-10 – 2018-12-12 (×4): 100 mg via ORAL
  Filled 2018-12-10 (×4): qty 1

## 2018-12-10 MED ORDER — METHOCARBAMOL 500 MG IVPB - SIMPLE MED
500.0000 mg | Freq: Four times a day (QID) | INTRAVENOUS | Status: DC | PRN
  Administered 2018-12-10: 09:00:00 500 mg via INTRAVENOUS
  Filled 2018-12-10: qty 50

## 2018-12-10 MED ORDER — MIDAZOLAM HCL 2 MG/2ML IJ SOLN
INTRAMUSCULAR | Status: AC
  Filled 2018-12-10: qty 2

## 2018-12-10 MED ORDER — ONDANSETRON HCL 4 MG/2ML IJ SOLN
INTRAMUSCULAR | Status: DC | PRN
  Administered 2018-12-10: 4 mg via INTRAVENOUS

## 2018-12-10 MED ORDER — METOCLOPRAMIDE HCL 5 MG PO TABS: 5.0000 mg | ORAL_TABLET | Freq: Three times a day (TID) | ORAL | Status: DC | PRN

## 2018-12-10 MED ORDER — DIPHENHYDRAMINE HCL 12.5 MG/5ML PO ELIX
12.5000 mg | ORAL_SOLUTION | ORAL | Status: DC | PRN
  Administered 2018-12-11: 01:00:00 25 mg via ORAL
  Filled 2018-12-10: qty 10

## 2018-12-10 MED ORDER — HYDROCODONE-ACETAMINOPHEN 7.5-325 MG PO TABS
1.0000 | ORAL_TABLET | ORAL | Status: DC | PRN
  Administered 2018-12-11: 14:00:00 2 via ORAL
  Filled 2018-12-10 (×2): qty 2

## 2018-12-10 MED ORDER — ASPIRIN 81 MG PO CHEW
81.0000 mg | CHEWABLE_TABLET | Freq: Two times a day (BID) | ORAL | Status: DC
  Administered 2018-12-10 – 2018-12-12 (×4): 81 mg via ORAL
  Filled 2018-12-10 (×4): qty 1

## 2018-12-10 SURGICAL SUPPLY — 47 items
ADH SKN CLS APL DERMABOND .7 (GAUZE/BANDAGES/DRESSINGS) ×1
BAG DECANTER FOR FLEXI CONT (MISCELLANEOUS) IMPLANT
BAG SPEC THK2 15X12 ZIP CLS (MISCELLANEOUS)
BAG ZIPLOCK 12X15 (MISCELLANEOUS) IMPLANT
BLADE SAG 18X100X1.27 (BLADE) ×3 IMPLANT
BLADE SURG SZ10 CARB STEEL (BLADE) ×6 IMPLANT
COVER PERINEAL POST (MISCELLANEOUS) ×3 IMPLANT
COVER SURGICAL LIGHT HANDLE (MISCELLANEOUS) ×3 IMPLANT
COVER WAND RF STERILE (DRAPES) IMPLANT
CUP ACET PINNACLE SECTR 50MM (Hips) IMPLANT
DERMABOND ADVANCED (GAUZE/BANDAGES/DRESSINGS) ×2
DERMABOND ADVANCED .7 DNX12 (GAUZE/BANDAGES/DRESSINGS) ×1 IMPLANT
DRAPE STERI IOBAN 125X83 (DRAPES) ×3 IMPLANT
DRAPE U-SHAPE 47X51 STRL (DRAPES) ×6 IMPLANT
DRESSING AQUACEL AG SP 3.5X10 (GAUZE/BANDAGES/DRESSINGS) ×1 IMPLANT
DRSG AQUACEL AG SP 3.5X10 (GAUZE/BANDAGES/DRESSINGS) ×3
DURAPREP 26ML APPLICATOR (WOUND CARE) ×3 IMPLANT
ELECT BLADE TIP CTD 4 INCH (ELECTRODE) ×3 IMPLANT
ELECT REM PT RETURN 15FT ADLT (MISCELLANEOUS) ×3 IMPLANT
ELIMINATOR HOLE APEX DEPUY (Hips) ×2 IMPLANT
FEM STEM 12/14 TAPER SZ 4 HIP (Orthopedic Implant) ×3 IMPLANT
FEMORAL STEM 12/14 TPR SZ4 HIP (Orthopedic Implant) IMPLANT
GLOVE BIOGEL PI IND STRL 7.5 (GLOVE) ×1 IMPLANT
GLOVE BIOGEL PI IND STRL 8.5 (GLOVE) ×1 IMPLANT
GLOVE BIOGEL PI INDICATOR 7.5 (GLOVE) ×2
GLOVE BIOGEL PI INDICATOR 8.5 (GLOVE) ×2
GLOVE ECLIPSE 8.0 STRL XLNG CF (GLOVE) ×6 IMPLANT
GLOVE ORTHO TXT STRL SZ7.5 (GLOVE) ×3 IMPLANT
GOWN STRL REUS W/TWL 2XL LVL3 (GOWN DISPOSABLE) ×3 IMPLANT
GOWN STRL REUS W/TWL LRG LVL3 (GOWN DISPOSABLE) ×3 IMPLANT
HEAD FEM STD 32X+1 STRL (Hips) ×2 IMPLANT
HOLDER FOLEY CATH W/STRAP (MISCELLANEOUS) ×3 IMPLANT
KIT TURNOVER KIT A (KITS) IMPLANT
LINER ACET PNNCL PLUS4 NEUTRAL (Hips) IMPLANT
PACK ANTERIOR HIP CUSTOM (KITS) ×3 IMPLANT
PINNACLE PLUS 4 NEUTRAL (Hips) ×3 IMPLANT
PINNACLE SECTOR CUP 50MM (Hips) ×3 IMPLANT
SCREW 6.5MMX30MM (Screw) ×2 IMPLANT
SUT MNCRL AB 4-0 PS2 18 (SUTURE) ×3 IMPLANT
SUT STRATAFIX 0 PDS 27 VIOLET (SUTURE) ×3
SUT VIC AB 1 CT1 36 (SUTURE) ×9 IMPLANT
SUT VIC AB 2-0 CT1 27 (SUTURE) ×6
SUT VIC AB 2-0 CT1 TAPERPNT 27 (SUTURE) ×2 IMPLANT
SUTURE STRATFX 0 PDS 27 VIOLET (SUTURE) ×1 IMPLANT
TRAY FOLEY MTR SLVR 16FR STAT (SET/KITS/TRAYS/PACK) IMPLANT
WATER STERILE IRR 1000ML POUR (IV SOLUTION) ×3 IMPLANT
YANKAUER SUCT BULB TIP 10FT TU (MISCELLANEOUS) IMPLANT

## 2018-12-10 NOTE — Op Note (Signed)
NAME:  Isabella Valencia NO.: [id number]      MEDICAL RECORD NO.: [id number]      FACILITY:  [facility]      PHYSICIAN:  Mauri Pole  DATE OF BIRTH:  12/04/1940     DATE OF PROCEDURE:  12/10/2018                                 OPERATIVE REPORT         PREOPERATIVE DIAGNOSIS: Right  hip osteoarthritis.      POSTOPERATIVE DIAGNOSIS:  Right hip osteoarthritis.      PROCEDURE:  Right total hip replacement through an anterior approach   utilizing DePuy THR system, component size 17mm pinnacle cup, a size 32+4 neutral   Altrex liner, a size 4Hi Actis stem with a 32+1 Articuleze metal ball.      SURGEON:  Pietro Cassis. Alvan Dame, M.D.      ASSISTANT:  Griffith Citron, PA-C     ANESTHESIA:  Spinal.      SPECIMENS:  None.      COMPLICATIONS:  None.      BLOOD LOSS:  200 cc     DRAINS:  None.      INDICATION OF THE PROCEDURE:  Isabella Valencia is a 78 y.o. female who had   presented to office for evaluation of right hip pain.  Radiographs revealed   progressive degenerative changes with bone-on-bone   articulation of the  hip joint, including subchondral cystic changes and osteophytes.  The patient had painful limited range of   motion significantly affecting their overall quality of life and function.  The patient was failing to    respond to conservative measures including medications and/or injections and activity modification and at this point was ready   to proceed with more definitive measures.  Consent was obtained for   benefit of pain relief.  Specific risks of infection, DVT, component   failure, dislocation, neurovascular injury, and need for revision surgery were reviewed in the office as well discussion of   the anterior versus posterior approach were reviewed.     PROCEDURE IN DETAIL:  The patient was brought to operative theater.   Once adequate anesthesia, preoperative antibiotics, 2 gm of Ancef, 1 gm of Tranexamic Acid, and 10 mg of  Decadron were administered, the patient was positioned supine on the [employer] table.  Once the patient was safely positioned with adequate padding of boney prominences we predraped out the hip, and used fluoroscopy to confirm orientation of the pelvis.      The right hip was then prepped and draped from proximal iliac crest to   mid thigh with a shower curtain technique.      Time-out was performed identifying the patient, planned procedure, and the appropriate extremity.     An incision was then made 2 cm lateral to the   anterior superior iliac spine extending over the orientation of the   tensor fascia lata muscle and sharp dissection was carried down to the   fascia of the muscle.      The fascia was then incised.  The muscle belly was identified and swept   laterally and retractor placed along the superior neck.  Following   cauterization of the circumflex vessels and removing some pericapsular   fat,  a second cobra retractor was placed on the inferior neck.  A T-capsulotomy was made along the line of the   superior neck to the trochanteric fossa, then extended proximally and   distally.  Tag sutures were placed and the retractors were then placed   intracapsular.  We then identified the trochanteric fossa and   orientation of my neck cut and then made a neck osteotomy with the femur on traction.  The femoral   head was removed without difficulty or complication.  Traction was let   off and retractors were placed posterior and anterior around the   acetabulum.      The labrum and foveal tissue were debrided.  I began reaming with a 45 mm   reamer and reamed up to 49 mm reamer with good bony bed preparation and a 50 mm  cup was chosen.  The final 50 mm Pinnacle cup was then impacted under fluoroscopy to confirm the depth of penetration and orientation with respect to   Abduction and forward flexion.  A screw was placed into the ilium followed by the hole eliminator.  The final   32+4  neutral Altrex liner was impacted with good visualized rim fit.  The cup was positioned anatomically within the acetabular portion of the pelvis.      At this point, the femur was rolled to 100 degrees.  Further capsule was   released off the inferior aspect of the femoral neck.  I then   released the superior capsule proximally.  With the leg in a neutral position the hook was placed laterally   along the femur under the vastus lateralis origin and elevated manually and then held in position using the hook attachment on the bed.  The leg was then extended and adducted with the leg rolled to 100   degrees of external rotation.  Retractors were placed along the medial calcar and posteriorly over the greater trochanter.  Once the proximal femur was fully   exposed, I used a box osteotome to set orientation.  I then began   broaching with the starting chili pepper broach and passed this by hand and then broached up to 4.  With the 4 broach in place I chose a high offset neck and did several trial reductions.  The offset was appropriate, leg lengths   appeared to be equal best matched with the +1 head ball trial confirmed radiographically.   Given these findings, I went ahead and dislocated the hip, repositioned all   retractors and positioned the right hip in the extended and abducted position.  The final 4 Hi Actis stem was   chosen and it was impacted down to the level of neck cut.  Based on this   and the trial reductions, a final 32+1 Articuleze metal head ball was chosen and   impacted onto a clean and dry trunnion, and the hip was reduced.  The   hip had been irrigated throughout the case again at this point.  I did   reapproximate the superior capsular leaflet to the anterior leaflet   using #1 Vicryl.  The fascia of the   tensor fascia lata muscle was then reapproximated using #1 Vicryl and #0 Stratafix sutures.  The   remaining wound was closed with 2-0 Vicryl and running 4-0 Monocryl.    The hip was cleaned, dried, and dressed sterilely using Dermabond and   Aquacel dressing.  The patient was then brought   to recovery room in  stable condition tolerating the procedure well.    Griffith Citron, PA-C was present for the entirety of the case involved from   preoperative positioning, perioperative retractor management, general   facilitation of the case, as well as primary wound closure as assistant.            Pietro Cassis Alvan Dame, M.D.        12/10/2018 7:27 AM

## 2018-12-10 NOTE — Anesthesia Postprocedure Evaluation (Signed)
Anesthesia Post Note  Patient: Isabella Valencia  Procedure(s) Performed: TOTAL HIP ARTHROPLASTY ANTERIOR APPROACH (Right )     Patient location during evaluation: PACU Anesthesia Type: Spinal Level of consciousness: oriented and awake and alert Pain management: pain level controlled Vital Signs Assessment: post-procedure vital signs reviewed and stable Respiratory status: spontaneous breathing, respiratory function stable and patient connected to nasal cannula oxygen Cardiovascular status: blood pressure returned to baseline and stable Postop Assessment: no headache, no backache and no apparent nausea or vomiting Anesthetic complications: no    Last Vitals:  Vitals:   12/10/18 1136 12/10/18 1240  BP: 132/72 (!) 121/59  Pulse: 65 77  Resp:  16  Temp: (!) 36.3 C   SpO2: 100% 100%    Last Pain:  Vitals:   12/10/18 1136  TempSrc: Oral  PainSc:                  Marylynne Keelin L Deshawn Witty

## 2018-12-10 NOTE — Evaluation (Signed)
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 Pt should progress to dc home with family assist and plans to stay with dtr Thayer Headings to recover.    Follow Up Recommendations Follow surgeon's recommendation for DC plan and follow-up therapies    Equipment Recommendations  None recommended by PT    Recommendations for Other Services       Precautions / Restrictions Precautions Precautions: Fall Restrictions Weight Bearing Restrictions: No      Mobility  Bed Mobility Overal bed mobility: Needs Assistance Bed Mobility: Supine to Sit     Supine to sit: Min assist     General bed mobility comments: cues for sequence with assist to manage R LE  Transfers Overall transfer level: Needs assistance Equipment used: Rolling walker (2 wheeled) Transfers: Sit to/from Stand Sit to Stand: Min assist         General transfer comment: cues for LE management and use of UEs to self assist  Ambulation/Gait Ambulation/Gait assistance: Min assist Gait Distance (Feet): 65 Feet Assistive device: Rolling walker (2 wheeled) Gait Pattern/deviations: Step-to pattern;Step-through pattern;Decreased step length - right;Decreased step length - left;Shuffle;Trunk flexed Gait velocity: decr   General Gait Details: cues for posture, sequence, position from RW and ER on R  Stairs            Wheelchair Mobility    Modified Rankin (Stroke Patients Only)       Balance Overall balance assessment: Mild deficits observed, not formally tested                                           Pertinent Vitals/Pain Pain Assessment: 0-10 Pain Score: 6  Pain Location: R hip Pain Descriptors / Indicators:  Aching;Sore Pain Intervention(s): Limited activity within patient's tolerance;Monitored during session;Premedicated before session;Ice applied    Home Living Family/patient expects to be discharged to:: Private residence Living Arrangements: Alone Available Help at Discharge: Family;Available 24 hours/day;Available PRN/intermittently Type of Home: House Home Access: Stairs to enter   Entrance Stairs-Number of Steps: 1 Home Layout: One level Home Equipment: Walker - 2 wheels;Walker - 4 wheels;Cane - single point;Grab bars - toilet Additional Comments: Pt to go home with dtr Thayer Headings - information [facility] reflects [facility]    Prior Function Level of Independence: Independent               Hand Dominance        Extremity/Trunk Assessment   Upper Extremity Assessment Upper Extremity Assessment: Overall WFL for tasks assessed    Lower Extremity Assessment Lower Extremity Assessment: RLE deficits/detail       Communication   Communication: No difficulties  Cognition Arousal/Alertness: Awake/alert Behavior During Therapy: WFL for tasks assessed/performed Overall Cognitive Status: Within Functional Limits for tasks assessed                                        General Comments      Exercises Total Joint Exercises Ankle Circles/Pumps: AROM;Both;15 reps;Supine   Assessment/Plan    PT Assessment Patient  needs continued PT services  PT Problem List Decreased strength;Decreased range of motion;Decreased activity tolerance;Decreased mobility;Pain;Decreased knowledge of use of DME       PT Treatment Interventions DME instruction;Gait training;Stair training;Functional mobility training;Therapeutic activities;Therapeutic exercise;Patient/family education    PT Goals (Current goals can be found in the Care Plan section)  Acute Rehab PT Goals Patient Stated Goal: Regain IND PT Goal Formulation: With patient Time For Goal Achievement:  12/17/18 Potential to Achieve Goals: Good    Frequency 7X/week   Barriers to discharge        Co-evaluation               AM-PAC PT "6 Clicks" Mobility  Outcome Measure Help needed turning from your back to your side while in a flat bed without using bedrails?: A Little Help needed moving from lying on your back to sitting on the side of a flat bed without using bedrails?: A Little Help needed moving to and from a bed to a chair (including a wheelchair)?: A Little Help needed standing up from a chair using your arms (e.g., wheelchair or bedside chair)?: A Little Help needed to walk in hospital room?: A Little Help needed climbing 3-5 steps with a railing? : A Lot 6 Click Score: 17    End of Session Equipment Utilized During Treatment: Gait belt Activity Tolerance: Patient tolerated treatment well Patient left: in chair;with call bell/phone within reach;with chair alarm set Nurse Communication: Mobility status PT Visit Diagnosis: Difficulty in walking, not elsewhere classified (R26.2)    Time: 8377-9396 PT Time Calculation (min) (ACUTE ONLY): 30 min   Charges:   PT Evaluation $PT Eval Low Complexity: 1 Low PT Treatments $Gait Training: 8-22 mins        [place] Pager [phone] Office [phone]   Tyshia Fenter 12/10/2018, 3:15 PM

## 2018-12-10 NOTE — Transfer of Care (Signed)
Immediate Anesthesia Transfer of Care Note  Patient: Isabella Valencia  Procedure(s) Performed: Procedure(s) with comments: TOTAL HIP ARTHROPLASTY ANTERIOR APPROACH (Right) - 70 mins  Patient Location: PACU  Anesthesia Type:Spinal  Level of Consciousness:  sedated, patient cooperative and responds to stimulation  Airway & Oxygen Therapy:Patient Spontanous Breathing and Patient connected to face mask oxgen  Post-op Assessment:  Report given to PACU RN and Post -op Vital signs reviewed and stable  Post vital signs:  Reviewed and stable  Last Vitals:  Vitals:   12/10/18 0605  BP: 115/75  Pulse: 86  Resp: 16  Temp: 36.9 C  SpO2: 43%    Complications: No apparent anesthesia complications

## 2018-12-10 NOTE — Anesthesia Procedure Notes (Signed)
Spinal  Patient location during procedure: OR Start time: 12/10/2018 7:07 AM End time: 12/10/2018 7:01 AM Staffing Anesthesiologist: Freddrick March, MD Performed: anesthesiologist  Preanesthetic Checklist Completed: patient identified, surgical consent, pre-op evaluation, timeout performed, IV checked, risks and benefits discussed and monitors and equipment checked Spinal Block Patient position: sitting Prep: site prepped and draped and DuraPrep Patient monitoring: cardiac monitor, continuous pulse ox and blood pressure Approach: midline Location: L3-4 Injection technique: single-shot Needle Needle type: Quincke  Needle gauge: 22 G Needle length: 9 cm Assessment Sensory level: T6 Additional Notes Functioning IV was confirmed and monitors were applied. Sterile prep and drape, including hand hygiene and sterile gloves were used. The patient was positioned and the spine was prepped. The skin was anesthetized with lidocaine.  Free flow of clear CSF was obtained prior to injecting local anesthetic into the CSF.  The spinal needle aspirated freely following injection.  The needle was carefully withdrawn.  The patient tolerated the procedure well.

## 2018-12-10 NOTE — Interval H&P Note (Signed)
History and Physical Interval Note:  12/10/2018 6:36 AM  Isabella Valencia  has presented today for surgery, with the diagnosis of Right hip osteoarthritis.  The various methods of treatment have been discussed with the patient and family. After consideration of risks, benefits and other options for treatment, the patient has consented to  Procedure(s) with comments: TOTAL HIP ARTHROPLASTY ANTERIOR APPROACH (Right) - 70 mins as a surgical intervention.  The patient's history has been reviewed, patient examined, no change in status, stable for surgery.  I have reviewed the patient's chart and labs.  Questions were answered to the patient's satisfaction.     Mauri Pole

## 2018-12-10 NOTE — Discharge Instructions (Signed)
INSTRUCTIONS AFTER JOINT REPLACEMENT  ° °o Remove items at home which could result in a fall. This includes throw rugs or furniture in walking pathways °o ICE to the affected joint every three hours while awake for 30 minutes at a time, for at least the first 3-5 days, and then as needed for pain and swelling.  Continue to use ice for pain and swelling. You may notice swelling that will progress down to the foot and ankle.  This is normal after surgery.  Elevate your leg when you are not up walking on it.   °o Continue to use the breathing machine you got in the hospital (incentive spirometer) which will help keep your temperature down.  It is common for your temperature to cycle up and down following surgery, especially at night when you are not up moving around and exerting yourself.  The breathing machine keeps your lungs expanded and your temperature down. ° ° °DIET:  As you were doing prior to hospitalization, we recommend a well-balanced diet. ° °DRESSING / WOUND CARE / SHOWERING ° °Keep the surgical dressing until follow up.  The dressing is water proof, so you can shower without any extra covering.  IF THE DRESSING FALLS OFF or the wound gets wet inside, change the dressing with sterile gauze.  Please use good hand washing techniques before changing the dressing.  Do not use any lotions or creams on the incision until instructed by your surgeon.   ° °ACTIVITY ° °o Increase activity slowly as tolerated, but follow the weight bearing instructions below.   °o No driving for 6 weeks or until further direction given by your physician.  You cannot drive while taking narcotics.  °o No lifting or carrying greater than 10 lbs. until further directed by your surgeon. °o Avoid periods of inactivity such as sitting longer than an hour when not asleep. This helps prevent blood clots.  °o You may return to work once you are authorized by your doctor.  ° ° ° °WEIGHT BEARING  ° °Weight bearing as tolerated with assist  device (walker, cane, etc) as directed, use it as long as suggested by your surgeon or therapist, typically at least 4-6 weeks. ° ° °EXERCISES ° °Results after joint replacement surgery are often greatly improved when you follow the exercise, range of motion and muscle strengthening exercises prescribed by your doctor. Safety measures are also important to protect the joint from further injury. Any time any of these exercises cause you to have increased pain or swelling, decrease what you are doing until you are comfortable again and then slowly increase them. If you have problems or questions, call your caregiver or physical therapist for advice.  ° °Rehabilitation is important following a joint replacement. After just a few days of immobilization, the muscles of the leg can become weakened and shrink (atrophy).  These exercises are designed to build up the tone and strength of the thigh and leg muscles and to improve motion. Often times heat used for twenty to thirty minutes before working out will loosen up your tissues and help with improving the range of motion but do not use heat for the first two weeks following surgery (sometimes heat can increase post-operative swelling).  ° °These exercises can be done on a training (exercise) mat, on the floor, on a table or on a bed. Use whatever works the best and is most comfortable for you.    Use music or television while you are exercising so that   the exercises are a pleasant break in your day. This will make your life better with the exercises acting as a break in your routine that you can look forward to.   Perform all exercises about fifteen times, three times per day or as directed.  You should exercise both the operative leg and the other leg as well. ° °Exercises include: °  °• Quad Sets - Tighten up the muscle on the front of the thigh (Quad) and hold for 5-10 seconds.   °• Straight Leg Raises - With your knee straight (if you were given a brace, keep it on),  lift the leg to 60 degrees, hold for 3 seconds, and slowly lower the leg.  Perform this exercise against resistance later as your leg gets stronger.  °• Leg Slides: Lying on your back, slowly slide your foot toward your buttocks, bending your knee up off the floor (only go as far as is comfortable). Then slowly slide your foot back down until your leg is flat on the floor again.  °• Angel Wings: Lying on your back spread your legs to the side as far apart as you can without causing discomfort.  °• Hamstring Strength:  Lying on your back, push your heel against the floor with your leg straight by tightening up the muscles of your buttocks.  Repeat, but this time bend your knee to a comfortable angle, and push your heel against the floor.  You may put a pillow under the heel to make it more comfortable if necessary.  ° °A rehabilitation program following joint replacement surgery can speed recovery and prevent re-injury in the future due to weakened muscles. Contact your doctor or a physical therapist for more information on knee rehabilitation.  ° ° °CONSTIPATION ° °Constipation is defined medically as fewer than three stools per week and severe constipation as less than one stool per week.  Even if you have a regular bowel pattern at home, your normal regimen is likely to be disrupted due to multiple reasons following surgery.  Combination of anesthesia, postoperative narcotics, change in appetite and fluid intake all can affect your bowels.  ° °YOU MUST use at least one of the following options; they are listed in order of increasing strength to get the job done.  They are all available over the counter, and you may need to use some, POSSIBLY even all of these options:   ° °Drink plenty of fluids (prune juice may be helpful) and high fiber foods °Colace 100 mg by mouth twice a day  °Senokot for constipation as directed and as needed Dulcolax (bisacodyl), take with full glass of water  °Miralax (polyethylene glycol)  once or twice a day as needed. ° °If you have tried all these things and are unable to have a bowel movement in the first 3-4 days after surgery call either your surgeon or your primary doctor.   ° °If you experience loose stools or diarrhea, hold the medications until you stool forms back up.  If your symptoms do not get better within 1 week or if they get worse, check with your doctor.  If you experience "the worst abdominal pain ever" or develop nausea or vomiting, please contact the office immediately for further recommendations for treatment. ° ° °ITCHING:  If you experience itching with your medications, try taking only a single pain pill, or even half a pain pill at a time.  You can also use Benadryl over the counter for itching or also to   help with sleep.   TED HOSE STOCKINGS:  Use stockings on both legs until for at least 2 weeks or as directed by physician office. They may be removed at night for sleeping.  MEDICATIONS:  See your medication summary on the After Visit Summary that nursing will review with you.  You may have some home medications which will be placed on hold until you complete the course of blood thinner medication.  It is important for you to complete the blood thinner medication as prescribed.  PRECAUTIONS:  If you experience chest pain or shortness of breath - call 911 immediately for transfer to the hospital emergency department.   If you develop a fever greater that 101 F, purulent drainage from wound, increased redness or drainage from wound, foul odor from the wound/dressing, or calf pain - CONTACT YOUR SURGEON.                                                   FOLLOW-UP APPOINTMENTS:  If you do not already have a post-op appointment, please call the office for an appointment to be seen by your surgeon.  Guidelines for how soon to be seen are listed in your After Visit Summary, but are typically between 1-4 weeks after surgery.  OTHER INSTRUCTIONS:    MAKE SURE YOU:    Understand these instructions.   Get help right away if you are not doing well or get worse.    Thank you for letting us be a part of your medical care team.  It is a privilege we respect greatly.  We hope these instructions will help you stay on track for a fast and full recovery!

## 2018-12-11 ENCOUNTER — Encounter (HOSPITAL_COMMUNITY): Payer: Self-pay | Admitting: Orthopedic Surgery

## 2018-12-11 LAB — BASIC METABOLIC PANEL
Anion gap: 6 (ref 5–15)
BUN: 13 mg/dL (ref 8–23)
CO2: 26 mmol/L (ref 22–32)
Calcium: 8.6 mg/dL — ABNORMAL LOW (ref 8.9–10.3)
Chloride: 107 mmol/L (ref 98–111)
Creatinine, Ser: 0.66 mg/dL (ref 0.44–1.00)
GFR calc Af Amer: >60 mL/min (ref >60)
GFR calc non Af Amer: >60 mL/min (ref >60)
Glucose, Bld: 112 mg/dL — ABNORMAL HIGH (ref 70–99)
Potassium: 4.3 mmol/L (ref 3.5–5.1)
Sodium: 139 mmol/L (ref 135–145)

## 2018-12-11 LAB — CBC
HCT: 30.9 % — ABNORMAL LOW (ref 36.0–46.0)
Hemoglobin: 9.5 g/dL — ABNORMAL LOW (ref 12.0–15.0)
MCH: 31.7 pg (ref 26.0–34.0)
MCHC: 30.7 g/dL (ref 30.0–36.0)
MCV: 103 fL — ABNORMAL HIGH (ref 80.0–100.0)
Platelets: 222 10*3/uL (ref 150–400)
RBC: 3 MIL/uL — ABNORMAL LOW (ref 3.87–5.11)
RDW: 13 % (ref 11.5–15.5)
WBC: 9.7 10*3/uL (ref 4.0–10.5)
nRBC: 0 % (ref 0.0–0.2)

## 2018-12-11 NOTE — Progress Notes (Signed)
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  Assisted OOB. Demonstrated and instructed pt how to use belt to self assist LE as a leg lifter.  Assisted with amb to bathroom.  General transfer comment: <25% VC's on proper hand placement and safety with turns  also assisted with toilet transfer.  Assisted with amb. General Gait Details: feeling better after nap.  Pt tolerated amb an increased distance in hallway and around room.  One VC safety with narrow spaces.  Pt plans to D/C to daughters home tomorrow.  One curb step.     Follow Up Recommendations  Follow surgeon's recommendation for DC plan and follow-up therapies     Equipment Recommendations  None recommended by PT    Recommendations for Other Services       Precautions / Restrictions Precautions Precautions: Fall Restrictions Weight Bearing Restrictions: No    Mobility  Bed Mobility Overal bed mobility: Needs Assistance Bed Mobility: Supine to Sit       Sit to supine: Min assist   General bed mobility comments: pt used belt to self assist LE off bed with increased time at Supervision Assist  Transfers Overall transfer level: Needs assistance Equipment used: Rolling walker (2 wheeled) Transfers: Sit to/from [facility] Sit to Stand: Min guard;Supervision Stand pivot transfers: Supervision;Min guard       General transfer comment: <25% VC's on proper hand placement and safety with turns  also assisted with toilet transfer  Ambulation/Gait Ambulation/Gait assistance: Supervision;Min guard Gait Distance (Feet): 58 Feet Assistive device: Rolling walker (2 wheeled) Gait Pattern/deviations: Step-to pattern;Step-through pattern;Decreased step length - right;Decreased step length -  left;Shuffle;Trunk flexed Gait velocity: decreased    General Gait Details: feeling better after nap.  Pt tolerated amb an increased distance in hallway and around room.  One VC safety with narrow spaces.    Stairs             Wheelchair Mobility    Modified Rankin (Stroke Patients Only)       Balance                                            Cognition Arousal/Alertness: Awake/alert Behavior During Therapy: WFL for tasks assessed/performed Overall Cognitive Status: Within Functional Limits for tasks assessed                                        Exercises      General Comments        Pertinent Vitals/Pain Pain Assessment: 0-10 Pain Score: 3  Pain Location: R hip Pain Descriptors / Indicators: Aching;Sore;Tender;Operative site guarding Pain Intervention(s): Monitored during session;Repositioned;Ice applied    Home Living                      Prior Function            PT Goals (current goals can now be found in the care plan section) Progress towards PT goals: Progressing toward goals    Frequency  7X/week      PT Plan Current plan remains appropriate    Co-evaluation              AM-PAC PT "6 Clicks" Mobility   Outcome Measure  Help needed turning from your back to your side while in a flat bed without using bedrails?: A Little Help needed moving from lying on your back to sitting on the side of a flat bed without using bedrails?: A Little Help needed moving to and from a bed to a chair (including a wheelchair)?: A Little Help needed standing up from a chair using your arms (e.g., wheelchair or bedside chair)?: A Little Help needed to walk in hospital room?: A Little Help needed climbing 3-5 steps with a railing? : A Lot 6 Click Score: 17    End of Session Equipment Utilized During Treatment: Gait belt Activity Tolerance: Patient tolerated treatment well Patient left: in bed;with call  bell/phone within reach;with bed alarm set Nurse Communication: Mobility status PT Visit Diagnosis: Difficulty in walking, not elsewhere classified (R26.2)     Time: 0233-4356 PT Time Calculation (min) (ACUTE ONLY): 26 min  Charges:  $Gait Training: 8-22 mins $Therapeutic Activity: 8-22 mins                     Rica Koyanagi  PTA Acute  Rehabilitation Services Pager      [phone] Office      [phone]

## 2018-12-11 NOTE — TOC Transition Note (Signed)
Transition of Care [facility]) - CM/SW Discharge Note   Patient Details  Name: Isabella Valencia MRN: [id number] Date of Birth: December 27, 1940  [facility] CM/SW Contact:  Leeroy Cha, RN Phone Number: 12/11/2018, 10:02 AM   Clinical Narrative:    Home with hep   Final next level of care: Home/Self Care Barriers to Discharge: No Barriers Identified   Patient Goals and CMS Choice Patient states their goals for this hospitalization and ongoing recovery are:: to heal and get going CMS Medicare.gov Compare Post Acute Care list provided to:: Patient    Discharge Placement                       Discharge Plan and Services                                     Social Determinants of Health (SDOH) Interventions     Readmission Risk Interventions No flowsheet data found.

## 2018-12-11 NOTE — Progress Notes (Signed)
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  Assisted back to bed per pt request to sleep.  Pt reported poor sleep last night.  Positioned to comfort and applied ICE.   Follow Up Recommendations  Follow surgeon's recommendation for DC plan and follow-up therapies     Equipment Recommendations  None recommended by PT    Recommendations for Other Services       Precautions / Restrictions Precautions Precautions: Fall    Mobility  Bed Mobility Overal bed mobility: Needs Assistance Bed Mobility: Sit to Supine       Sit to supine: Min assist   General bed mobility comments: assist LE up onto bed   Transfers Overall transfer level: Needs assistance Equipment used: Rolling walker (2 wheeled) Transfers: Sit to/from [employer] Sit to Stand: Min guard Stand pivot transfers: Min guard;Min assist       General transfer comment: 25% VC's on proper hand placement and safety with turns  Ambulation/Gait Ambulation/Gait assistance: Min guard;Min assist Gait Distance (Feet): 45 Feet Assistive device: Rolling walker (2 wheeled) Gait Pattern/deviations: Step-to pattern;Step-through pattern;Decreased step length - right;Decreased step length - left;Shuffle;Trunk flexed Gait velocity: decreased    General Gait Details: decreased distance due to increased c/o pain and difficulty advancing R LE   Stairs             Wheelchair Mobility    Modified Rankin (Stroke Patients Only)       Balance                                            Cognition Arousal/Alertness: Awake/alert Behavior During Therapy: WFL for tasks assessed/performed Overall Cognitive Status: Within Functional Limits for tasks  assessed                                        Exercises   10 reps AP and knee presses 10 reps HS using belt   General Comments        Pertinent Vitals/Pain Pain Assessment: 0-10 Pain Score: 5  Pain Location: R hip Pain Descriptors / Indicators: Aching;Sore;Tender;Operative site guarding Pain Intervention(s): Monitored during session;Repositioned;Premedicated before session;Ice applied    Home Living                      Prior Function            PT Goals (current goals can now be found in the care plan section) Progress towards PT goals: Progressing toward goals    Frequency    7X/week      PT Plan Current plan remains appropriate    Co-evaluation              AM-PAC PT "6 Clicks" Mobility   Outcome Measure  Help needed turning from your back to your side while in a flat bed without using bedrails?: A Little Help needed moving from lying on your back to sitting on the side of a flat bed without using bedrails?: A Little  Help needed moving to and from a bed to a chair (including a wheelchair)?: A Little Help needed standing up from a chair using your arms (e.g., wheelchair or bedside chair)?: A Little Help needed to walk in hospital room?: A Little Help needed climbing 3-5 steps with a railing? : A Lot 6 Click Score: 17    End of Session Equipment Utilized During Treatment: Gait belt Activity Tolerance: Patient tolerated treatment well Patient left: in bed;with call bell/phone within reach;with bed alarm set Nurse Communication: Mobility status(pt plans to D/C to home tomorrow ) PT Visit Diagnosis: Difficulty in walking, not elsewhere classified (R26.2)     Time: 8127-5170 PT Time Calculation (min) (ACUTE ONLY): 25 min  Charges:  $Gait Training: 8-22 mins $Therapeutic Activity: 8-22 mins                     Rica Koyanagi  PTA Acute  Rehabilitation Services Pager      [phone] Office      [phone]

## 2018-12-11 NOTE — Progress Notes (Signed)
     Subjective: 1 Day Post-Op Procedure(s) (LRB): TOTAL HIP ARTHROPLASTY ANTERIOR APPROACH (Right)   Patient reports pain as mild/mod, but controlled with medications.  No events throughout the night.  Looking forward to progressing in her recovery.  She will he going to her daughters house upon d/c.  She is ready to be discharged home, if she does well with PT.    Objective:   VITALS:   Vitals:   12/11/18 0108 12/11/18 0442  BP: 112/71 110/60  Pulse: 70 63  Resp: 16 16  Temp: (!) 97.5 F (36.4 C) 98.7 F (37.1 C)  SpO2: 99% 99%    Dorsiflexion/Plantar flexion intact Incision: dressing C/D/I No cellulitis present Compartment soft  LABS Recent Labs    12/11/18 0403  HGB 9.5*  HCT 30.9*  WBC 9.7  PLT 222    Recent Labs    12/11/18 0403  NA 139  K 4.3  BUN 13  CREATININE 0.66  GLUCOSE 112*     Assessment/Plan: 1 Day Post-Op Procedure(s) (LRB): TOTAL HIP ARTHROPLASTY ANTERIOR APPROACH (Right) Foley cath d/c'ed Advance diet Up with therapy D/C IV fluids Discharge home Follow up in 2 weeks at [facility] ([place]). Follow up with OLIN,Alazia Crocket D in 2 weeks.  Contact information:  [facility]) [address] [place] [phone]    Overweight (BMI 25-29.9) Estimated body mass index is 27.15 kg/m as calculated from the following:   Height as of this encounter: 5\' 4"  (1.626 m).   Weight as of this encounter: 71.8 kg. Patient also counseled that weight may inhibit the healing process Patient counseled that losing weight will help with future health issues      [place]. Braylee Lal   PAC  12/11/2018, 8:33 AM

## 2018-12-12 LAB — CBC
HCT: 30.1 % — ABNORMAL LOW (ref 36.0–46.0)
Hemoglobin: 9.6 g/dL — ABNORMAL LOW (ref 12.0–15.0)
MCH: 32.9 pg (ref 26.0–34.0)
MCHC: 31.9 g/dL (ref 30.0–36.0)
MCV: 103.1 fL — ABNORMAL HIGH (ref 80.0–100.0)
Platelets: 207 10*3/uL (ref 150–400)
RBC: 2.92 MIL/uL — ABNORMAL LOW (ref 3.87–5.11)
RDW: 13.2 % (ref 11.5–15.5)
WBC: 10.7 10*3/uL — ABNORMAL HIGH (ref 4.0–10.5)
nRBC: 0 % (ref 0.0–0.2)

## 2018-12-12 LAB — BASIC METABOLIC PANEL
Anion gap: 7 (ref 5–15)
BUN: 15 mg/dL (ref 8–23)
CO2: 26 mmol/L (ref 22–32)
Calcium: 8.9 mg/dL (ref 8.9–10.3)
Chloride: 107 mmol/L (ref 98–111)
Creatinine, Ser: 0.72 mg/dL (ref 0.44–1.00)
GFR calc Af Amer: >60 mL/min (ref >60)
GFR calc non Af Amer: >60 mL/min (ref >60)
Glucose, Bld: 97 mg/dL (ref 70–99)
Potassium: 4.8 mmol/L (ref 3.5–5.1)
Sodium: 140 mmol/L (ref 135–145)

## 2018-12-12 NOTE — Progress Notes (Signed)
Physical Therapy Treatment Patient Details Name: Isabella Valencia MRN: [id number] DOB: [birth year] Today's Date: 12/12/2018    History of Present Illness R THR    PT Comments    POD # 2 pm session. Assisted with amb an increased dustance.  Assisted to bathroom.  Then returned to room to perform some TE's following HEP handout.  Instructed on proper tech, freq as well as use of ICE.   Marland Kitchen  Addressed all mobility questions, discussed appropriate activity, educated on use of ICE.  Pt ready for D/C to home.   Follow Up Recommendations  Other (comment)(HEP)     Equipment Recommendations       Recommendations for Other Services       Precautions / Restrictions Precautions Precautions: None    Mobility  Bed Mobility Overal bed mobility: Needs Assistance Bed Mobility: Supine to Sit     Supine to sit: Min assist     General bed mobility comments: assist R LE demonstarted on how to use a belt to self assist  Transfers Overall transfer level: Needs assistance Equipment used: Rolling walker (2 wheeled) Transfers: Sit to/from Stand;Stand Pivot Transfers Sit to Stand: Supervision;Min guard Stand pivot transfers: Supervision;Min guard       General transfer comment: 25% VC's on safety with turns and increased time  Ambulation/Gait Ambulation/Gait assistance: Supervision;Min guard Gait Distance (Feet): 38 Feet Assistive device: Rolling walker (2 wheeled) Gait Pattern/deviations: Step-to pattern;Decreased stance time - right Gait velocity: decreased   General Gait Details: tolerated an increased distance   Education officer, environmental    Modified Rankin (Stroke Patients Only)       Balance                                            Cognition Arousal/Alertness: Awake/alert Behavior During Therapy: WFL for tasks assessed/performed Overall Cognitive Status: Within Functional Limits for tasks assessed                                        Exercises   Total Hip Replacement TE's 10 reps ankle pumps 10 reps knee presses 10 reps heel slides 10 reps SAQ's 10 reps ABD Followed by ICE     General Comments        Pertinent Vitals/Pain Pain Assessment: 0-10 Pain Score: 8  Pain Location: R hip Pain Descriptors / Indicators: Grimacing;Discomfort;Tender;Throbbing;Sore Pain Intervention(s): Monitored during session;Repositioned;Ice applied    Home Living                      Prior Function            PT Goals (current goals can now be found in the care plan section) Progress towards PT goals: Progressing toward goals    Frequency           PT Plan Current plan remains appropriate    Co-evaluation              AM-PAC PT "6 Clicks" Mobility   Outcome Measure  Help needed turning from your back to your side while in a flat bed without using bedrails?: A Little Help needed moving from lying on your back to sitting on the side of a flat bed without using bedrails?: A Little Help needed moving to  and from a bed to a chair (including a wheelchair)?: A Little Help needed standing up from a chair using your arms (e.g., wheelchair or bedside chair)?: A Little Help needed to walk in hospital room?: A Little Help needed climbing 3-5 steps with a railing? : A Little 6 Click Score: 18    End of Session Equipment Utilized During Treatment: Gait belt Activity Tolerance: Patient limited by fatigue Patient left: in chair;with call bell/phone within reach   PT Visit Diagnosis: Unsteadiness on feet (R26.81)     Time: 7341-9379 PT Time Calculation (min) (ACUTE ONLY): 27 min  Charges:  $Gait Training: 8-22 mins $Therapeutic Exercise: 23-37 mins                     Rica Koyanagi  PTA Acute  Rehabilitation Services Pager      [phone] Office      [phone]

## 2018-12-12 NOTE — TOC Transition Note (Signed)
Transition of Care [facility]) - CM/SW Discharge Note   Patient Details  Name: HEER JUSTISS MRN: [id number] Date of Birth: 28-Jul-1940  [facility]) CM/SW Contact:  Leeroy Cha, RN Phone Number: 12/12/2018, 4:00 PM   Clinical Narrative:    dcd home with hhc through [place]   Final next level of care: [place] Barriers to Discharge: No Barriers Identified   Patient Goals and CMS Choice Patient states their goals for this hospitalization and ongoing recovery are:: to go home CMS Medicare.gov Compare Post Acute Care list provided to:: Patient Choice offered to / list presented to : Patient  Discharge Placement                       Discharge Plan and Services   Discharge Planning Services: CM Consult Post Acute Care Choice: Home Health, Durable Medical Equipment          DME Arranged: Walker rolling, 3-N-1 DME Agency: [employer] Date DME Agency Contacted: 12/12/18 Time DME Agency Contacted: 0900 Representative spoke with at DME Agency: nathan HH Arranged: PT [place]: [facility] [facility] [employer] Date [place]: 12/12/18 Time [place]: 1559 Representative spoke with at [place]: [place] ([place]) Interventions     Readmission Risk Interventions No flowsheet data found.

## 2018-12-12 NOTE — TOC Transition Note (Signed)
Transition of Care [facility] - CM/SW Discharge Note   Patient Details  Name: Isabella Valencia MRN: [id number] Date of Birth: 09-01-1940  [facility] Care [facility]) CM/SW Contact:  Leeroy Cha, RN Phone Number: 12/12/2018, 11:06 AM   Clinical Narrative:    dcd to home with dme and hep   Final next level of care: Home/Self Care Barriers to Discharge: No Barriers Identified   Patient Goals and CMS Choice Patient states their goals for this hospitalization and ongoing recovery are:: to just go home and do the hep CMS Medicare.gov Compare Post Acute Care list provided to:: Patient    Discharge Placement                       Discharge Plan and Services   Discharge Planning Services: CM Consult Post Acute Care Choice: Durable Medical Equipment          DME Arranged: Walker rolling, 3-N-1 DME Agency: [employer] Date DME Agency Contacted: 12/12/18 Time DME Agency Contacted: 0900 Representative spoke with at DME Agency: nathan            Social Determinants of Health ([place]) Interventions     Readmission Risk Interventions No flowsheet data found.

## 2018-12-12 NOTE — Progress Notes (Signed)
Patient discharged patient home w/ family. Given all belongings, instructions, equipment. Verbalized understanding of all instructions. Escorted to pov via w/c.

## 2018-12-12 NOTE — TOC Transition Note (Signed)
Transition of Care [facility]) - CM/SW Discharge Note   Patient Details  Name: Isabella Valencia MRN: [id number] Date of Birth: 01/07/1941  [facility]) CM/SW Contact:  Leeroy Cha, RN Phone Number: 12/12/2018, 11:00 AM   Clinical Narrative:    dcd to home with self care, dme and HEP   Final next level of care: Home/Self Care Barriers to Discharge: No Barriers Identified   Patient Goals and CMS Choice Patient states their goals for this hospitalization and ongoing recovery are:: to follow the hep and get better CMS Medicare.gov Compare Post Acute Care list provided to:: Patient    Discharge Placement                       Discharge Plan and Services   Discharge Planning Services: CM Consult Post Acute Care Choice: Durable Medical Equipment          DME Arranged: Walker rolling, 3-N-1 DME Agency: [employer] Date DME Agency Contacted: 12/12/18 Time DME Agency Contacted: 0900 Representative spoke with at DME Agency: nathan            Social Determinants of Health ([place]) Interventions     Readmission Risk Interventions No flowsheet data found.

## 2018-12-12 NOTE — Progress Notes (Signed)
Physical Therapy Treatment Patient Details Name: Isabella Valencia MRN: [id number] DOB: [birth year] Today's Date: 12/12/2018    History of Present Illness R THR    PT Comments    POD # 1 am session Assisted OOB to amb in hallway and practice one step.  General Gait Details: limited distance due to fatigue and increased c/o pain.  General stair comments: 25% VC's on proper walker placement and proper sequencing Session limited due to increased c/o pain.  Pt will need another PT session to address HEP.   Follow Up Recommendations  Other (comment)(HEP)     Equipment Recommendations       Recommendations for Other Services       Precautions / Restrictions Precautions Precautions: None    Mobility  Bed Mobility Overal bed mobility: Needs Assistance Bed Mobility: Supine to Sit     Supine to sit: Min assist     General bed mobility comments: assist R LE demonstarted on how to use a belt to self assist  Transfers Overall transfer level: Needs assistance Equipment used: Rolling walker (2 wheeled) Transfers: Sit to/from [employer] Transfers Sit to Stand: Supervision;Min guard Stand pivot transfers: Supervision;Min guard       General transfer comment: 25% VC's on safety with turns and increased time  Ambulation/Gait Ambulation/Gait assistance: Supervision;Min guard Gait Distance (Feet): 11 Feet Assistive device: Rolling walker (2 wheeled) Gait Pattern/deviations: Step-to pattern;Decreased stance time - right Gait velocity: decreased   General Gait Details: limited distance due to fatigue and increased c/o pain   Stairs Stairs: Yes Stairs assistance: Min guard Stair Management: No rails;Step to pattern;With walker Number of Stairs: 1 General stair comments: 25% VC's on proper walker placement and proper sequencing   Wheelchair Mobility    Modified Rankin (Stroke Patients Only)       Balance                                             Cognition Arousal/Alertness: Awake/alert Behavior During Therapy: WFL for tasks assessed/performed Overall Cognitive Status: Within Functional Limits for tasks assessed                                        Exercises      General Comments        Pertinent Vitals/Pain Pain Assessment: 0-10 Pain Score: 8  Pain Location: R hip Pain Descriptors / Indicators: Grimacing;Discomfort;Tender;Throbbing;Sore Pain Intervention(s): Monitored during session;Repositioned;Ice applied    Home Living                      Prior Function            PT Goals (current goals can now be found in the care plan section) Progress towards PT goals: Progressing toward goals    Frequency           PT Plan Current plan remains appropriate    Co-evaluation              AM-PAC PT "6 Clicks" Mobility   Outcome Measure  Help needed turning from your back to your side while in a flat bed without using bedrails?: A Little Help needed moving from lying on your back to sitting on the side of a flat bed without using bedrails?:  A Little Help needed moving to and from a bed to a chair (including a wheelchair)?: A Little Help needed standing up from a chair using your arms (e.g., wheelchair or bedside chair)?: A Little Help needed to walk in hospital room?: A Little Help needed climbing 3-5 steps with a railing? : A Little 6 Click Score: 18    End of Session Equipment Utilized During Treatment: Gait belt Activity Tolerance: Patient limited by fatigue Patient left: in chair;with call bell/phone within reach   PT Visit Diagnosis: Unsteadiness on feet (R26.81)     Time: 7048-8891 PT Time Calculation (min) (ACUTE ONLY): 12 min  Charges:  $Gait Training: 8-22 mins                     Rica Koyanagi  PTA Acute  Rehabilitation Services Pager      [phone] Office      [phone]

## 2018-12-12 NOTE — Progress Notes (Signed)
     Subjective: 2 Days Post-Op Procedure(s) (LRB): TOTAL HIP ARTHROPLASTY ANTERIOR APPROACH (Right)   Patient reports pain as mild, controlled with medications. No significant events throughout the night.  Stated it was a little rough getting up at about 2 AM to use the bathroom, but otherwise she is good.  She feels better than she did yesterday.  Ready to be discharged home if she does well with PT.     Objective:   VITALS:   Vitals:   12/11/18 2152 12/12/18 0605  BP: 109/68 138/67  Pulse: 64 66  Resp: 16 18  Temp: 98.3 F (36.8 C) 97.8 F (36.6 C)  SpO2: 99% 98%    Dorsiflexion/Plantar flexion intact Incision: dressing C/D/I No cellulitis present Compartment soft  LABS Recent Labs    12/11/18 0403 12/12/18 0455  HGB 9.5* 9.6*  HCT 30.9* 30.1*  WBC 9.7 10.7*  PLT 222 207    Recent Labs    12/11/18 0403 12/12/18 0455  NA 139 140  K 4.3 4.8  BUN 13 15  CREATININE 0.66 0.72  GLUCOSE 112* 97     Assessment/Plan: 2 Days Post-Op Procedure(s) (LRB): TOTAL HIP ARTHROPLASTY ANTERIOR APPROACH (Right) Up with therapy Discharge home Follow up in 2 weeks at [facility] ([place]). Follow up with OLIN,Wyn Nettle D in 2 weeks.  Contact information:  [facility]) [address], [address] [place]. Isabella Valencia   PAC  12/12/2018, 8:11 AM

## 2018-12-13 ENCOUNTER — Telehealth: Payer: Self-pay | Admitting: *Deleted

## 2018-12-13 NOTE — Telephone Encounter (Signed)
Pt was on TCM report was admitted 12/10/18 for right total hip arthroplasty. Pt tolerated procedure and D/C 12/12/18, Pt will follow=up w/surgeon in 2 weeks.Marland KitchenJohny Chess

## 2018-12-17 NOTE — Discharge Summary (Signed)
Physician Discharge Summary  Patient ID: Isabella Valencia MRN: [id number] DOB/AGE: 78/05/1941 78 y.o.  Admit date: 12/10/2018 Discharge date: 12/12/2018   Procedures:  Procedure(s) (LRB): TOTAL HIP ARTHROPLASTY ANTERIOR APPROACH (Right)  Attending Physician:  Dr. Paralee Cancel   Admission Diagnoses:   Right hip primary OA / pain  Discharge Diagnoses:  Active Problems:   Overweight (BMI 25.0-29.9)   S/P right THA, AA  Past Medical History:  Diagnosis Date  . Arthritis 2004   neg ANA & RA  . Asthma    minimal problems  . Cervical disc disease   . Complication of anesthesia    reports had toruble waking after general anes for hysterectomy.  no troubles with anesthesia with the R TKR in 2016   . Difficulty sleeping    due to knee pain  . Heart murmur   . History of skin cancer   . Hyperlipidemia   . MVP (mitral valve prolapse)    reports this is the cause of her murmur. denies any cardiac sx related to  MVP . does not have a cardiologist . reporst she gets her annual check ups of her heart at her pcp office   . Pre-diabetes    hgba1c 11-22-2018 5.8%, patient states that she feels more like her blood sugar drops than it being high , gets dizzy idf she doesnt eat     HPI:    Isabella Valencia, 78 y.o. female, has a history of pain and functional disability in the right hip(s) due to arthritis and patient has failed non-surgical conservative treatments for greater than 12 weeks to include NSAID's and/or analgesics, corticosteriod injections, use of assistive devices and activity modification.  Onset of symptoms was abrupt starting in November 2019  with gradually worsening course since that time.The patient noted no past surgery on the right hip(s).  Patient currently rates pain in the right hip at 8 out of 10 with activity. Patient has night pain, worsening of pain with activity and weight bearing, trendelenberg gait, pain that interfers with activities of daily living and pain with passive  range of motion. Patient has evidence of periarticular osteophytes and joint space narrowing by imaging studies. This condition presents safety issues increasing the risk of falls. There is no current active infection.  Risks, benefits and expectations were discussed with the patient.  Risks including but not limited to the risk of anesthesia, blood clots, nerve damage, blood vessel damage, failure of the prosthesis, infection and up to and including death.  Patient understand the risks, benefits and expectations and wishes to proceed with surgery.   PCP: Binnie Rail, MD   Discharged Condition: good  Hospital Course:  Patient underwent the above stated procedure on 12/10/2018. Patient tolerated the procedure well and brought to the recovery room in good condition and subsequently to the floor.  POD #1 BP: 110/60 ; Pulse: 63 ; Temp: 98.7 F (37.1 C) ; Resp: 16 Patient reports pain as mild/mod, but controlled with medications.  No events throughout the night.  Looking forward to progressing in her recovery.  She will he going to her daughters house upon d/c. Slow progress with PT. Dorsiflexion/plantar flexion intact, incision: dressing C/D/I, no cellulitis present and compartment soft.   LABS  Basename    HGB     9.5  HCT     30.9   POD #2  BP: 138/67 ; Pulse: 66 ; Temp: 97.8 F (36.6 C) ; Resp: 18 Patient reports pain as  mild, controlled with medications. No significant events throughout the night.  Stated it was a little rough getting up at about 2 AM to use the bathroom, but otherwise she is good.  She feels better than she did yesterday.  Ready to be discharged home. Dorsiflexion/plantar flexion intact, incision: dressing C/D/I, no cellulitis present and compartment soft.   LABS  Basename    HGB     9.6  HCT     30.1    Discharge Exam: General appearance: alert, cooperative and no distress Extremities: Homans sign is negative, no sign of DVT, no edema, redness or tenderness in the  calves or thighs and no ulcers, gangrene or trophic changes  Disposition:  Home with follow up in 2 weeks   Follow-up Information    Paralee Cancel, MD In 2 weeks.   Specialty: Orthopedic Surgery Contact information: [address] [place] [address] [phone]           Discharge Instructions    Call MD / Call 911   Complete by: As directed    If you experience chest pain or shortness of breath, CALL 911 and be transported to the hospital emergency room.  If you develope a fever above 101 F, pus (white drainage) or increased drainage or redness at the wound, or calf pain, call your surgeon's office.   Change dressing   Complete by: As directed    Maintain surgical dressing until follow up in the clinic. If the edges start to pull up, may reinforce with tape. If the dressing is no longer working, may remove and cover with gauze and tape, but must keep the area dry and clean.  Call with any questions or concerns.   Constipation Prevention   Complete by: As directed    Drink plenty of fluids.  Prune juice may be helpful.  You may use a stool softener, such as Colace (over the counter) 100 mg twice a day.  Use MiraLax (over the counter) for constipation as needed.   Diet - low sodium heart healthy   Complete by: As directed    Discharge instructions   Complete by: As directed    Maintain surgical dressing until follow up in the clinic. If the edges start to pull up, may reinforce with tape. If the dressing is no longer working, may remove and cover with gauze and tape, but must keep the area dry and clean.  Follow up in 2 weeks at [facility]. Call with any questions or concerns.   Increase activity slowly as tolerated   Complete by: As directed    Weight bearing as tolerated with assist device (walker, cane, etc) as directed, use it as long as suggested by your surgeon or therapist, typically at least 4-6 weeks.   TED hose   Complete by: As directed     Use stockings (TED hose) for 2 weeks on both leg(s).  You may remove them at night for sleeping.      Allergies as of 12/12/2018      Reactions   Triamcinolone Acetonide    Soft tissue atrophy with injection of Kenalog   Adhesive [tape] Rash   Tears skin, Please use "paper" tape      Medication List    STOP taking these medications   aspirin EC 81 MG tablet Replaced by: aspirin 81 MG chewable tablet   naproxen sodium 220 MG tablet Commonly known as: ALEVE   traMADol 50 MG tablet Commonly known as: Veatrice Bourbon  TAKE these medications   aspirin 81 MG chewable tablet Commonly known as: Aspirin Childrens Chew 1 tablet (81 mg total) by mouth 2 (two) times daily for 30 days. Take for 4 weeks, then resume regular dose. Replaces: aspirin EC 81 MG tablet   CALCIUM + D PO Take 1 tablet by mouth daily.   diclofenac sodium 1 % Gel Commonly known as: VOLTAREN Apply 1 application topically daily as needed (knee pain).   docusate sodium 100 MG capsule Commonly known as: Colace Take 1 capsule (100 mg total) by mouth 2 (two) times daily.   ferrous sulfate 325 (65 FE) MG tablet Commonly known as: FerrouSul Take 1 tablet (325 mg total) by mouth 3 (three) times daily with meals.   HYDROcodone-acetaminophen 7.5-325 MG tablet Commonly known as: Norco Take 1-2 tablets by mouth every 4 (four) hours as needed for moderate pain.   methocarbamol 500 MG tablet Commonly known as: Robaxin Take 1 tablet (500 mg total) by mouth every 6 (six) hours as needed for muscle spasms.   One-A-Day Essential Tabs Take 1 tablet by mouth every morning.   polyethylene glycol 17 g packet Commonly known as: MIRALAX / GLYCOLAX Take 17 g by mouth 2 (two) times daily.   Turmeric Curcumin 500 MG Caps Takes one daily What changed:   how much to take  how to take this  when to take this            Discharge Care Instructions  (From admission, onward)         Start     Ordered   12/11/18  0000  Change dressing    Comments: Maintain surgical dressing until follow up in the clinic. If the edges start to pull up, may reinforce with tape. If the dressing is no longer working, may remove and cover with gauze and tape, but must keep the area dry and clean.  Call with any questions or concerns.   12/11/18 8786           Signed: West Pugh. Georganne Siple   PA-C  12/17/2018, 8:12 AM

## 2019-01-22 DIAGNOSIS — Z471 Aftercare following joint replacement surgery: Secondary | ICD-10-CM | POA: Diagnosis not present

## 2019-01-22 DIAGNOSIS — Z96641 Presence of right artificial hip joint: Secondary | ICD-10-CM | POA: Diagnosis not present

## 2019-04-21 ENCOUNTER — Telehealth: Payer: Self-pay

## 2019-04-21 DIAGNOSIS — K5730 Diverticulosis of large intestine without perforation or abscess without bleeding: Secondary | ICD-10-CM | POA: Diagnosis not present

## 2019-04-21 DIAGNOSIS — Z8371 Family history of colonic polyps: Secondary | ICD-10-CM | POA: Diagnosis not present

## 2019-04-21 DIAGNOSIS — Z1211 Encounter for screening for malignant neoplasm of colon: Secondary | ICD-10-CM | POA: Diagnosis not present

## 2019-04-21 DIAGNOSIS — Z800 Family history of malignant neoplasm of digestive organs: Secondary | ICD-10-CM | POA: Diagnosis not present

## 2019-04-21 DIAGNOSIS — Z86010 Personal history of colonic polyps: Secondary | ICD-10-CM | POA: Diagnosis not present

## 2019-04-21 LAB — HM COLONOSCOPY

## 2019-04-21 NOTE — Telephone Encounter (Signed)
Appointment made

## 2019-04-21 NOTE — Progress Notes (Signed)
Subjective:    Patient ID: Isabella Valencia, female    DOB: June 30, 1941, 78 y.o.   MRN: [id number]  HPI The patient is here for an acute visit.   Diarrhea x 3 months:  Everything she eats she has diarrhea - snacks and meals.  Her stool has been a light brown in color, there was no blood.  The stool is soft-watery.  She decreased what she was eating due to this.  She had a colonoscopy yesterday by Dr Earlean Shawl - it was normal, except diverticulosis.   Dr Earlean Shawl advised probiotics and f/u with him in a few weeks.  No tests were done prior to the colonoscopy ( blood work and stool studies).    She has not had diarrhea since her colonoscopy yesterday.  She had a very soft BM this morning.   June the 9th she had her hip replacement and she was on antibiotics at that time.      Medications and allergies reviewed with patient and updated if appropriate.  Patient Active Problem List   Diagnosis Date Noted  . S/P right THA, AA 12/10/2018  . Hyperglycemia 11/22/2018  . Pre-op evaluation 11/22/2018  . Osteoarthritis of right hip 08/30/2018  . Mitral regurgitation 02/26/2016  . Overweight (BMI 25.0-29.9) 10/08/2014  . 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 S/P knee replacement 10/06/2014  . History of anesthesia reaction 08/20/2014  . RBBB (right bundle branch block) 05/13/2014  . Hyperlipidemia 11/04/2008  . DRY EYE SYNDROME 11/04/2008  . Intermittent asthma without complication [id number]    Current Outpatient Medications on File Prior to Visit  Medication Sig Dispense Refill  . Calcium Citrate-Vitamin D (CALCIUM + D PO) Take 1 tablet by mouth daily.    . diclofenac sodium (VOLTAREN) 1 % GEL Apply 1 application topically daily as needed (knee pain).    . ferrous sulfate (FERROUSUL) 325 (65 FE) MG tablet Take 1 tablet (325 mg total) by mouth 3 (three) times daily with meals.  3  . methocarbamol (ROBAXIN) 500 MG tablet Take 1 tablet (500 mg total) by mouth every 6 (six) hours as needed for muscle  spasms. 40 tablet 0  . Multiple Vitamin (ONE-A-DAY ESSENTIAL) TABS Take 1 tablet by mouth every morning.     . Turmeric Curcumin 500 MG CAPS Takes one daily (Patient taking differently: Take 500 mg by mouth daily. Takes one daily)     No current facility-administered medications on file prior to visit.     Past Medical History:  Diagnosis Date  . Arthritis 2004   neg ANA & RA  . Asthma    minimal problems  . Cervical disc disease   . Complication of anesthesia    reports had toruble waking after general anes for hysterectomy.  no troubles with anesthesia with the R TKR in 2016   . Difficulty sleeping    due to knee pain  . Heart murmur   . History of skin cancer   . Hyperlipidemia   . MVP (mitral valve prolapse)    reports this is the cause of her murmur. denies any cardiac sx related to  MVP . does not have a cardiologist . reporst she gets her annual check ups of her heart at her pcp office   . Pre-diabetes    hgba1c 11-22-2018 5.8%, patient states that she feels more like her blood sugar drops than it being high , gets dizzy idf she doesnt eat     Past Surgical History:  Procedure  Laterality Date  . ABDOMINAL HYSTERECTOMY    . BREAST ENHANCEMENT SURGERY Bilateral   . CATARACT EXTRACTION    . COLONOSCOPY     every 5 years ; Dr Earlean Shawl  . cosmetic eye surgery    . ELBOW SURGERY      X3  . KNEE ARTHROSCOPY     rt knee  . TOTAL HIP ARTHROPLASTY Right 12/10/2018   Procedure: TOTAL HIP ARTHROPLASTY ANTERIOR APPROACH;  Surgeon: Paralee Cancel, MD;  Location: [facility];  Service: Orthopedics;  Laterality: Right;  70 mins  . TOTAL KNEE ARTHROPLASTY Right 10/06/2014   Procedure: RIGHT TOTAL KNEE ARTHROPLASTY;  Surgeon: Paralee Cancel, MD;  Location: [facility];  Service: Orthopedics;  Laterality: Right;  . TUBAL LIGATION      Social History   Socioeconomic History  . Marital status: Divorced    Spouse name: Not on file  . Number of children: Not on file  . Years of education: Not on file   . Highest education level: Not on file  Occupational History  . Not on file  Social Needs  . Financial resource strain: Not on file  . Food insecurity    Worry: Not on file    Inability: Not on file  . Transportation needs    Medical: Not on file    Non-medical: Not on file  Tobacco Use  . Smoking status: Never Smoker  . Smokeless tobacco: Never Used  Substance and Sexual Activity  . Alcohol use: Not Currently    Alcohol/week: 0.0 standard drinks    Comment:  wine socially;  none since fall 2019  . Drug use: No  . Sexual activity: Not on file  Lifestyle  . Physical activity    Days per week: Not on file    Minutes per session: Not on file  . Stress: Not on file  Relationships  . Social Herbalist on phone: Not on file    Gets together: Not on file    Attends religious service: Not on file    Active member of club or organization: Not on file    Attends meetings of clubs or organizations: Not on file    Relationship status: Not on file  Other Topics Concern  . Not on file  Social History Narrative   No regular exercise    Family History  Problem Relation Age of Onset  . Arthritis Mother   . Colon cancer Mother   . Stroke Father   . Colon cancer Maternal Aunt   . Colon cancer Maternal Grandmother   . Colon polyps Brother     Review of Systems  Constitutional: Negative for chills and fever.  Gastrointestinal: Positive for abdominal pain (cramping before a BM only) and diarrhea. Negative for blood in stool, nausea and vomiting.  Neurological: Negative for dizziness, light-headedness and headaches.       Objective:   Vitals:   04/22/19 1258  BP: 124/78  Pulse: 84  Resp: 16  Temp: 98.6 F (37 C)  SpO2: 97%   BP Readings from Last 3 Encounters:  04/22/19 124/78  12/12/18 120/63  12/03/18 117/80   Wt Readings from Last 3 Encounters:  04/22/19 154 lb 3.2 oz (69.9 kg)  12/10/18 158 lb 3.2 oz (71.8 kg)  12/03/18 158 lb 3.2 oz (71.8 kg)    Body mass index is 26.47 kg/m.   Physical Exam Constitutional:      General: She is not in acute distress.    Appearance:  Normal appearance. She is not ill-appearing.  HENT:     Head: Normocephalic and atraumatic.  Abdominal:     General: Bowel sounds are normal. There is no distension.     Palpations: Abdomen is soft. There is no mass.     Tenderness: There is abdominal tenderness (minimal tenderness on left side - ? from colonoscopy yesterday). There is no guarding or rebound.     Hernia: No hernia is present.  Musculoskeletal:     Right lower leg: No edema.     Left lower leg: No edema.  Skin:    General: Skin is warm and dry.  Neurological:     Mental Status: She is alert.            Assessment & Plan:    See Problem List for Assessment and Plan of chronic medical problems.

## 2019-04-21 NOTE — Telephone Encounter (Signed)
Ok to make appointment

## 2019-04-21 NOTE — Telephone Encounter (Signed)
Yes, okay.

## 2019-04-21 NOTE — Telephone Encounter (Signed)
Copied from [place] [id number]. Topic: Appointment Scheduling - Scheduling Inquiry for Clinic >> Apr 21, 2019  2:12 PM Rayann Heman wrote: Reason for CRM: pt called and stated that she would like to schedule an in person visit for diarrhea x 78mo. Please advise

## 2019-04-22 ENCOUNTER — Other Ambulatory Visit: Payer: Self-pay

## 2019-04-22 ENCOUNTER — Ambulatory Visit (INDEPENDENT_AMBULATORY_CARE_PROVIDER_SITE_OTHER): Payer: Medicare Other | Admitting: Internal Medicine

## 2019-04-22 ENCOUNTER — Other Ambulatory Visit (INDEPENDENT_AMBULATORY_CARE_PROVIDER_SITE_OTHER): Payer: Medicare Other

## 2019-04-22 ENCOUNTER — Encounter: Payer: Self-pay | Admitting: Internal Medicine

## 2019-04-22 VITALS — BP 124/78 | HR 84 | Temp 98.6°F | Resp 16 | Ht 64.0 in | Wt 154.2 lb

## 2019-04-22 DIAGNOSIS — R197 Diarrhea, unspecified: Secondary | ICD-10-CM

## 2019-04-22 DIAGNOSIS — Z23 Encounter for immunization: Secondary | ICD-10-CM | POA: Diagnosis not present

## 2019-04-22 LAB — COMPREHENSIVE METABOLIC PANEL
ALT: 6 U/L (ref 0–35)
AST: 17 U/L (ref 0–37)
Albumin: 4.4 g/dL (ref 3.5–5.2)
Alkaline Phosphatase: 83 U/L (ref 39–117)
BUN: 14 mg/dL (ref 6–23)
CO2: 25 mEq/L (ref 19–32)
Calcium: 9.7 mg/dL (ref 8.4–10.5)
Chloride: 103 mEq/L (ref 96–112)
Creatinine, Ser: 0.91 mg/dL (ref 0.40–1.20)
GFR: 59.67 mL/min — ABNORMAL LOW (ref >60.00)
Glucose, Bld: 89 mg/dL (ref 70–99)
Potassium: 4 mEq/L (ref 3.5–5.1)
Sodium: 137 mEq/L (ref 135–145)
Total Bilirubin: 0.6 mg/dL (ref 0.2–1.2)
Total Protein: 7.6 g/dL (ref 6.0–8.3)

## 2019-04-22 LAB — CBC WITH DIFFERENTIAL/PLATELET
Basophils Absolute: 0.1 10*3/uL (ref 0.0–0.1)
Basophils Relative: 1 % (ref 0.0–3.0)
Eosinophils Absolute: 0.1 10*3/uL (ref 0.0–0.7)
Eosinophils Relative: 1.5 % (ref 0.0–5.0)
HCT: 38.6 % (ref 36.0–46.0)
Hemoglobin: 12.7 g/dL (ref 12.0–15.0)
Lymphocytes Relative: 17.7 % (ref 12.0–46.0)
Lymphs Abs: 1.5 10*3/uL (ref 0.7–4.0)
MCHC: 32.9 g/dL (ref 30.0–36.0)
MCV: 95.6 fl (ref 78.0–100.0)
Monocytes Absolute: 0.8 10*3/uL (ref 0.1–1.0)
Monocytes Relative: 9.5 % (ref 3.0–12.0)
Neutro Abs: 5.8 10*3/uL (ref 1.4–7.7)
Neutrophils Relative %: 70.3 % (ref 43.0–77.0)
Platelets: 296 10*3/uL (ref 150.0–400.0)
RBC: 4.04 Mil/uL (ref 3.87–5.11)
RDW: 13.9 % (ref 11.5–15.5)
WBC: 8.2 10*3/uL (ref 4.0–10.5)

## 2019-04-22 LAB — TSH: TSH: 1.27 u[IU]/mL (ref 0.35–4.50)

## 2019-04-22 NOTE — Assessment & Plan Note (Addendum)
Diarrhea x 3 months or so No other concerning symptoms Colonoscopy yesterday - normal Had abx in June with hip replacement Stool cultures Cbc, cmp, tsh Start probiotics

## 2019-04-22 NOTE — Addendum Note (Signed)
Addended by: Isaiah Serge D on: 04/22/2019 01:35 PM   Modules accepted: Orders

## 2019-04-22 NOTE — Patient Instructions (Addendum)
Have blood work and stool cultures done.  We will call you to the results.   Medications reviewed and updated.  Changes include :   None.  Start probiotics.     Please call if there is no improvement in your symptoms.     Diarrhea, Adult Diarrhea is frequent loose and watery bowel movements. Diarrhea can make you feel weak and cause you to become dehydrated. Dehydration can make you tired and thirsty, cause you to have a dry mouth, and decrease how often you urinate. Diarrhea typically lasts 2-3 days. However, it can last longer if it is a sign of something more serious. It is important to treat your diarrhea as told by your health care provider. Follow these instructions at home: Eating and drinking     Follow these recommendations as told by your health care provider:  Take an oral rehydration solution (ORS). This is an over-the-counter medicine that helps return your body to its normal balance of nutrients and water. It is found at pharmacies and retail stores.  Drink plenty of fluids, such as water, ice chips, diluted fruit juice, and low-calorie sports drinks. You can drink milk also, if desired.  Avoid drinking fluids that contain a lot of sugar or caffeine, such as energy drinks, sports drinks, and soda.  Eat bland, easy-to-digest foods in small amounts as you are able. These foods include bananas, applesauce, rice, lean meats, toast, and crackers.  Avoid alcohol.  Avoid spicy or fatty foods.  Medicines  Take over-the-counter and prescription medicines only as told by your health care provider.  If you were prescribed an antibiotic medicine, take it as told by your health care provider. Do not stop using the antibiotic even if you start to feel better. General instructions   Wash your hands often using soap and water. If soap and water are not available, use a hand sanitizer. Others in the household should wash their hands as well. Hands should be washed: ? After using  the toilet or changing a diaper. ? Before preparing, cooking, or serving food. ? While caring for a sick person or while visiting someone in a hospital.  Drink enough fluid to keep your urine pale yellow.  Rest at home while you recover.  Watch your condition for any changes.  Take a warm bath to relieve any burning or pain from frequent diarrhea episodes.  Keep all follow-up visits as told by your health care provider. This is important. Contact a health care provider if:  You have a fever.  Your diarrhea gets worse.  You have new symptoms.  You cannot keep fluids down.  You feel light-headed or dizzy.  You have a headache.  You have muscle cramps. Get help right away if:  You have chest pain.  You feel extremely weak or you faint.  You have bloody or black stools or stools that look like tar.  You have severe pain, cramping, or bloating in your abdomen.  You have trouble breathing or you are breathing very quickly.  Your heart is beating very quickly.  Your skin feels cold and clammy.  You feel confused.  You have signs of dehydration, such as: ? Dark urine, very little urine, or no urine. ? Cracked lips. ? Dry mouth. ? Sunken eyes. ? Sleepiness. ? Weakness. Summary  Diarrhea is frequent loose and watery bowel movements. Diarrhea can make you feel weak and cause you to become dehydrated.  Drink enough fluids to keep your urine pale yellow.  Make sure that you wash your hands after using the toilet. If soap and water are not available, use hand sanitizer.  Contact a health care provider if your diarrhea gets worse or you have new symptoms.  Get help right away if you have signs of dehydration. This information is not intended to replace advice given to you by your health care provider. Make sure you discuss any questions you have with your health care provider. Document Released: 06/09/2002 Document Revised: 11/23/2017 Document Reviewed:  11/23/2017 Elsevier Patient Education  2020 [employer].

## 2019-04-22 NOTE — Addendum Note (Signed)
Addended by: Delice Bison E on: 04/22/2019 03:58 PM   Modules accepted: Orders

## 2019-04-24 ENCOUNTER — Other Ambulatory Visit: Payer: Medicare Other

## 2019-04-24 DIAGNOSIS — R197 Diarrhea, unspecified: Secondary | ICD-10-CM

## 2019-04-27 LAB — CLOSTRIDIUM DIFFICILE EIA: C difficile Toxins A+B, EIA: NEGATIVE

## 2019-04-28 LAB — STOOL CULTURE
MICRO NUMBER:: 1018858
MICRO NUMBER:: 1018859
MICRO NUMBER:: 1018860
SHIGA RESULT:: NOT DETECTED
SPECIMEN QUALITY:: ADEQUATE
SPECIMEN QUALITY:: ADEQUATE
SPECIMEN QUALITY:: ADEQUATE

## 2019-05-06 DIAGNOSIS — H43813 Vitreous degeneration, bilateral: Secondary | ICD-10-CM | POA: Diagnosis not present

## 2019-05-06 DIAGNOSIS — H26491 Other secondary cataract, right eye: Secondary | ICD-10-CM | POA: Diagnosis not present

## 2019-05-06 DIAGNOSIS — H35371 Puckering of macula, right eye: Secondary | ICD-10-CM | POA: Diagnosis not present

## 2019-05-06 DIAGNOSIS — H17823 Peripheral opacity of cornea, bilateral: Secondary | ICD-10-CM | POA: Diagnosis not present

## 2019-06-04 DIAGNOSIS — H04123 Dry eye syndrome of bilateral lacrimal glands: Secondary | ICD-10-CM | POA: Diagnosis not present

## 2019-06-04 DIAGNOSIS — H17823 Peripheral opacity of cornea, bilateral: Secondary | ICD-10-CM | POA: Diagnosis not present

## 2019-06-04 DIAGNOSIS — H26491 Other secondary cataract, right eye: Secondary | ICD-10-CM | POA: Diagnosis not present

## 2019-06-04 DIAGNOSIS — H35371 Puckering of macula, right eye: Secondary | ICD-10-CM | POA: Diagnosis not present

## 2019-06-04 DIAGNOSIS — H43813 Vitreous degeneration, bilateral: Secondary | ICD-10-CM | POA: Diagnosis not present

## 2019-08-20 DIAGNOSIS — M1712 Unilateral primary osteoarthritis, left knee: Secondary | ICD-10-CM | POA: Diagnosis not present

## 2019-08-27 DIAGNOSIS — M1712 Unilateral primary osteoarthritis, left knee: Secondary | ICD-10-CM | POA: Diagnosis not present

## 2019-09-03 DIAGNOSIS — M1712 Unilateral primary osteoarthritis, left knee: Secondary | ICD-10-CM | POA: Diagnosis not present

## 2020-01-21 DIAGNOSIS — D2261 Melanocytic nevi of right upper limb, including shoulder: Secondary | ICD-10-CM | POA: Diagnosis not present

## 2020-01-21 DIAGNOSIS — D2271 Melanocytic nevi of right lower limb, including hip: Secondary | ICD-10-CM | POA: Diagnosis not present

## 2020-01-21 DIAGNOSIS — D2272 Melanocytic nevi of left lower limb, including hip: Secondary | ICD-10-CM | POA: Diagnosis not present

## 2020-01-21 DIAGNOSIS — D225 Melanocytic nevi of trunk: Secondary | ICD-10-CM | POA: Diagnosis not present

## 2020-01-21 DIAGNOSIS — Z85828 Personal history of other malignant neoplasm of skin: Secondary | ICD-10-CM | POA: Diagnosis not present

## 2020-06-18 DIAGNOSIS — H35371 Puckering of macula, right eye: Secondary | ICD-10-CM | POA: Diagnosis not present

## 2020-06-18 DIAGNOSIS — H26491 Other secondary cataract, right eye: Secondary | ICD-10-CM | POA: Diagnosis not present

## 2020-06-18 DIAGNOSIS — H43813 Vitreous degeneration, bilateral: Secondary | ICD-10-CM | POA: Diagnosis not present

## 2020-06-18 DIAGNOSIS — Z961 Presence of intraocular lens: Secondary | ICD-10-CM | POA: Diagnosis not present

## 2020-07-07 ENCOUNTER — Other Ambulatory Visit: Payer: Self-pay

## 2020-07-07 ENCOUNTER — Ambulatory Visit (INDEPENDENT_AMBULATORY_CARE_PROVIDER_SITE_OTHER): Payer: Medicare Other | Admitting: Internal Medicine

## 2020-07-07 ENCOUNTER — Encounter: Payer: Self-pay | Admitting: Internal Medicine

## 2020-07-07 VITALS — BP 122/74 | HR 82 | Temp 98.1°F | Ht 64.0 in | Wt 162.0 lb

## 2020-07-07 DIAGNOSIS — Z23 Encounter for immunization: Secondary | ICD-10-CM | POA: Diagnosis not present

## 2020-07-07 DIAGNOSIS — T8543XA Leakage of breast prosthesis and implant, initial encounter: Secondary | ICD-10-CM

## 2020-07-07 DIAGNOSIS — R197 Diarrhea, unspecified: Secondary | ICD-10-CM | POA: Diagnosis not present

## 2020-07-07 NOTE — Progress Notes (Signed)
Subjective:    Patient ID: Isabella Valencia, female    DOB: April 18, 1941, 80 y.o.   MRN: [id number]  HPI The patient is here for an acute visit.   She has breast implants years ago - she had them done in her 56's, then redone in her 39's.  After having the implants she had itchy and a lump in her left breast - it was never right.   The right breast implants started leaking just before Christmas. It has lost its shape.     Diarrhea, chronic - if she eats she will go to the bathroom at up to 3 times after.  This started after her hip replacement 12/2018.  It does not matter what she eats.  Anything raw causes the diarrhea.  Yesterday she had three episodes a day.  She takes imodium or kaopectate as needed when she needs to be out and it helps temporarily.    Blood work and stool culture and cdiff negative in 04/2019.  Probiotic did not help.  She has not tired any fiber supplement.     Medications and allergies reviewed with patient and updated if appropriate.  Patient Active Problem List   Diagnosis Date Noted  . Diarrhea 04/22/2019  . S/P right THA, AA 12/10/2018  . Hyperglycemia 11/22/2018  . Pre-op evaluation 11/22/2018  . Osteoarthritis of right hip 08/30/2018  . Mitral regurgitation 02/26/2016  . Overweight (BMI 25.0-29.9) 10/08/2014  . 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 S/P knee replacement 10/06/2014  . History of anesthesia reaction 08/20/2014  . RBBB (right bundle branch block) 05/13/2014  . Hyperlipidemia 11/04/2008  . DRY EYE SYNDROME 11/04/2008  . Intermittent asthma without complication 11/04/2008    Current Outpatient Medications on File Prior to Visit  Medication Sig Dispense Refill  . Calcium Citrate-Vitamin D (CALCIUM + D PO) Take 1 tablet by mouth daily.    . diclofenac sodium (VOLTAREN) 1 % GEL Apply 1 application topically daily as needed (knee pain).    . Multiple Vitamin (ONE-A-DAY ESSENTIAL) TABS Take 1 tablet by mouth every morning.     . prednisoLONE acetate  (PRED FORTE) 1 % ophthalmic suspension prednisolone acetate 1 % eye drops,suspension  1 DROP IN RIGHT EYE TWICE A DAY USE FOR 5 DAYS AFTER LASER PROCEDURE    . Turmeric Curcumin 500 MG CAPS Takes one daily (Patient taking differently: Take 500 mg by mouth daily. Takes one daily)    . ferrous sulfate (FERROUSUL) 325 (65 FE) MG tablet Take 1 tablet (325 mg total) by mouth 3 (three) times daily with meals. (Patient not taking: Reported on 07/07/2020)  3  . methocarbamol (ROBAXIN) 500 MG tablet Take 1 tablet (500 mg total) by mouth every 6 (six) hours as needed for muscle spasms. (Patient not taking: Reported on 07/07/2020) 40 tablet 0   No current facility-administered medications on file prior to visit.    Past Medical History:  Diagnosis Date  . Arthritis 2004   neg ANA & RA  . Asthma    minimal problems  . Cervical disc disease   . Complication of anesthesia    reports had toruble waking after general anes for hysterectomy.  no troubles with anesthesia with the R TKR in 2016   . Difficulty sleeping    due to knee pain  . Heart murmur   . History of skin cancer   . Hyperlipidemia   . MVP (mitral valve prolapse)    reports this is the cause of  her murmur. denies any cardiac sx related to  MVP . does not have a cardiologist . reporst she gets her annual check ups of her heart at her pcp office   . Pre-diabetes    hgba1c 11-22-2018 5.8%, patient states that she feels more like her blood sugar drops than it being high , gets dizzy idf she doesnt eat     Past Surgical History:  Procedure Laterality Date  . ABDOMINAL HYSTERECTOMY    . BREAST ENHANCEMENT SURGERY Bilateral   . CATARACT EXTRACTION    . COLONOSCOPY     every 5 years ; Dr Earlean Shawl  . cosmetic eye surgery    . ELBOW SURGERY      X3  . KNEE ARTHROSCOPY     rt knee  . TOTAL HIP ARTHROPLASTY Right 12/10/2018   Procedure: TOTAL HIP ARTHROPLASTY ANTERIOR APPROACH;  Surgeon: Paralee Cancel, MD;  Location: [facility];  Service: Orthopedics;   Laterality: Right;  70 mins  . TOTAL KNEE ARTHROPLASTY Right 10/06/2014   Procedure: RIGHT TOTAL KNEE ARTHROPLASTY;  Surgeon: Paralee Cancel, MD;  Location: [facility];  Service: Orthopedics;  Laterality: Right;  . TUBAL LIGATION      Social History   Socioeconomic History  . Marital status: Divorced    Spouse name: Not on file  . Number of children: Not on file  . Years of education: Not on file  . Highest education level: Not on file  Occupational History  . Not on file  Tobacco Use  . Smoking status: Never Smoker  . Smokeless tobacco: Never Used  Substance and Sexual Activity  . Alcohol use: Not Currently    Alcohol/week: 0.0 standard drinks    Comment:  wine socially;  none since fall 2019  . Drug use: No  . Sexual activity: Not on file  Other Topics Concern  . Not on file  Social History Narrative   No regular exercise   Social Determinants of Health   Financial Resource Strain: Not on file  Food Insecurity: Not on file  Transportation Needs: Not on file  Physical Activity: Not on file  Stress: Not on file  Social Connections: Not on file    Family History  Problem Relation Age of Onset  . Arthritis Mother   . Colon cancer Mother   . Stroke Father   . Colon cancer Maternal Aunt   . Colon cancer Maternal Grandmother   . Colon polyps Brother     Review of Systems  Constitutional: Negative for appetite change, chills, fever and unexpected weight change.  Gastrointestinal: Positive for diarrhea. Negative for abdominal pain (occ cramping with excessive diarrhea) and blood in stool.       No gerd       Objective:   Vitals:   07/07/20 1037  BP: 122/74  Pulse: 82  Temp: 98.1 F (36.7 C)  SpO2: 96%   BP Readings from Last 3 Encounters:  07/07/20 122/74  04/22/19 124/78  12/12/18 120/63   Wt Readings from Last 3 Encounters:  07/07/20 162 lb (73.5 kg)  04/22/19 154 lb 3.2 oz (69.9 kg)  12/10/18 158 lb 3.2 oz (71.8 kg)   Body mass index is 27.81  kg/m.   Physical Exam Constitutional:      General: She is not in acute distress.    Appearance: Normal appearance. She is not ill-appearing.  HENT:     Head: Normocephalic and atraumatic.  Chest:     Comments: R breast w/o firmness, implant palpable  and 7 o'clock, L breast firm, palpable bump around 12 o'clock which has been present for years per pt Abdominal:     General: There is no distension.     Palpations: Abdomen is soft. There is no mass.     Tenderness: There is no abdominal tenderness. There is no guarding or rebound.     Hernia: No hernia is present.  Skin:    General: Skin is warm and dry.     Findings: No erythema or rash.  Neurological:     Mental Status: She is alert.            Assessment & Plan:    I spent 20 minutes dedicated to the care of this patient on the date of this encounter including review of recent labs, imaging and procedures, speciality notes, obtaining history, communicating with the patient, ordering medications, tests, and documenting clinical information in the EHR   See Problem List for Assessment and Plan of chronic medical problems.    This visit occurred during the SARS-CoV-2 public health emergency.  Safety protocols were in place, including screening questions prior to the visit, additional usage of staff PPE, and extensive cleaning of exam room while observing appropriate contact time as indicated for disinfecting solutions.

## 2020-07-07 NOTE — Assessment & Plan Note (Signed)
Chronic Ongoing since 12/2018 after THK Likely functional Blood work, colonoscopy, stool studies normal over one year ago Probiotics not helpful Start metamucil 1-2 / day Stop all dairy, continue to avoid raw veges If no improvement in diarrhea with change in diet - may need to see GI again

## 2020-07-07 NOTE — Patient Instructions (Addendum)
Flu immunization administered today.      Medications changes include :   Start metamucil once -twice daily.      A referral was ordered for plastic surgery.     Someone from their office will call you to schedule an appointment.    Change your diet - avoid raw veges and change our milk to almond milk.

## 2020-07-07 NOTE — Assessment & Plan Note (Signed)
Acute, R breast - saline implant Wants implants removed, which I agree with No concerning finding of infection or side effects from leak Referred to plastics

## 2020-08-16 ENCOUNTER — Other Ambulatory Visit: Payer: Self-pay

## 2020-08-16 ENCOUNTER — Ambulatory Visit: Payer: Medicare Other | Admitting: Plastic Surgery

## 2020-08-16 ENCOUNTER — Encounter: Payer: Self-pay | Admitting: Plastic Surgery

## 2020-08-16 VITALS — BP 138/89 | HR 92 | Ht 64.0 in | Wt 163.2 lb

## 2020-08-16 DIAGNOSIS — T8543XA Leakage of breast prosthesis and implant, initial encounter: Secondary | ICD-10-CM

## 2020-08-16 NOTE — Progress Notes (Signed)
Patient ID: Isabella Valencia, female    DOB: [birth year], 80 y.o.   MRN: [id number]   Chief Complaint  Patient presents with  . Advice Only    The patient is a lovely 80 year old female here for evaluation of her breasts.  The patient had smooth saline implants placed 27 years ago.  Due to rupture they were replaced when the patient was 80 years old.  Her last mammogram was April 2015.  She was a 32 A prior to the implant placement and is now at 34 B.  The right implant is ruptured.  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  She is 5 feet 4 inches tall and weighs 163 pounds.  She is in good health.  She has had a knee and hip surgery by Dr. Alvan Dame.  She does not have diabetes and is not a smoker.  She is interested in having the implants removed and does not want them replaced.   Review of Systems  Constitutional: Negative.  Negative for activity change and appetite change.  Eyes: Negative.   Respiratory: Negative.  Negative for chest tightness.   Cardiovascular: Negative.   Gastrointestinal: Negative.   Endocrine: Negative.   Genitourinary: Negative.   Musculoskeletal: Negative.   Hematological: Negative.   Psychiatric/Behavioral: Negative.     Past Medical History:  Diagnosis Date  . Arthritis 2004   neg ANA & RA  . Asthma    minimal problems  . Cervical disc disease   . Complication of anesthesia    reports had toruble waking after general anes for hysterectomy.  no troubles with anesthesia with the R TKR in 2016   . Difficulty sleeping    due to knee pain  . Heart murmur   . History of skin cancer   . Hyperlipidemia   . MVP (mitral valve prolapse)    reports this is the cause of her murmur. denies any cardiac sx related to  MVP . does not have a cardiologist . reporst she gets her annual check ups of her heart at her pcp office   . Pre-diabetes    hgba1c 11-22-2018 5.8%, patient states that she feels more like her blood sugar drops than it being high ,  gets dizzy idf she doesnt eat     Past Surgical History:  Procedure Laterality Date  . ABDOMINAL HYSTERECTOMY    . BREAST ENHANCEMENT SURGERY Bilateral   . CATARACT EXTRACTION    . COLONOSCOPY     every 5 years ; Dr Earlean Shawl  . cosmetic eye surgery    . ELBOW SURGERY      X3  . KNEE ARTHROSCOPY     rt knee  . TOTAL HIP ARTHROPLASTY Right 12/10/2018   Procedure: TOTAL HIP ARTHROPLASTY ANTERIOR APPROACH;  Surgeon: Paralee Cancel, MD;  Location: [facility];  Service: Orthopedics;  Laterality: Right;  70 mins  . TOTAL KNEE ARTHROPLASTY Right 10/06/2014   Procedure: RIGHT TOTAL KNEE ARTHROPLASTY;  Surgeon: Paralee Cancel, MD;  Location: [facility];  Service: Orthopedics;  Laterality: Right;  . TUBAL LIGATION        Current Outpatient Medications:  .  Calcium Citrate-Vitamin D (CALCIUM + D PO), Take 1 tablet by mouth daily., Disp: , Rfl:  .  diclofenac sodium (VOLTAREN) 1 % GEL, Apply 1 application topically daily as needed (knee pain)., Disp: , Rfl:  .  Multiple Vitamin (ONE-A-DAY ESSENTIAL) TABS, Take 1 tablet by mouth every morning. , Disp: ,  Rfl:  .  prednisoLONE acetate (PRED FORTE) 1 % ophthalmic suspension, prednisolone acetate 1 % eye drops,suspension  1 DROP IN RIGHT EYE TWICE A DAY USE FOR 5 DAYS AFTER LASER PROCEDURE, Disp: , Rfl:  .  Turmeric Curcumin 500 MG CAPS, Takes one daily (Patient taking differently: Take 500 mg by mouth daily. Takes one daily), Disp: , Rfl:    Objective:   Vitals:   08/16/20 1100  BP: 138/89  Pulse: 92  SpO2: 98%    Physical Exam Vitals and nursing note reviewed.  Constitutional:      Appearance: Normal appearance.  Cardiovascular:     Rate and Rhythm: Normal rate.     Pulses: Normal pulses.  Pulmonary:     Effort: Pulmonary effort is normal. No respiratory distress.  Abdominal:     General: Abdomen is flat. There is no distension.     Tenderness: There is no abdominal tenderness.  Skin:    General: Skin is warm.     Capillary Refill: Capillary  refill takes less than 2 seconds.  Neurological:     General: No focal deficit present.     Mental Status: She is alert and oriented to person, place, and time.  Psychiatric:        Mood and Affect: Mood normal.        Behavior: Behavior normal.        Thought Content: Thought content normal.     Assessment & Plan:  Breast implant leak, initial encounter  The patient is a good candidate for removal of the implants and a mastopexy.  We will provide her with a quote and also submit to insurance to see if the removal can be taken care of. We will also get an updated mammogram for her.  Pictures were obtained of the patient and placed in the chart with the patient's or guardian's permission.   [place], DO

## 2020-08-17 ENCOUNTER — Other Ambulatory Visit: Payer: Self-pay | Admitting: Plastic Surgery

## 2020-08-17 DIAGNOSIS — T8543XA Leakage of breast prosthesis and implant, initial encounter: Secondary | ICD-10-CM

## 2020-09-28 ENCOUNTER — Other Ambulatory Visit: Payer: Self-pay

## 2020-09-28 ENCOUNTER — Ambulatory Visit: Payer: Medicare Other

## 2020-09-28 ENCOUNTER — Ambulatory Visit
Admission: RE | Admit: 2020-09-28 | Discharge: 2020-09-28 | Disposition: A | Discharge status: Home / Self Care | Payer: Medicare Other | Source: Ambulatory Visit | Attending: Plastic Surgery | Admitting: Plastic Surgery

## 2020-09-28 DIAGNOSIS — R922 Inconclusive mammogram: Secondary | ICD-10-CM | POA: Diagnosis not present

## 2020-09-28 DIAGNOSIS — T8543XA Leakage of breast prosthesis and implant, initial encounter: Secondary | ICD-10-CM

## 2020-09-29 ENCOUNTER — Other Ambulatory Visit: Payer: Medicare Other

## 2020-10-12 ENCOUNTER — Encounter: Payer: Self-pay | Admitting: Surgical

## 2020-10-12 ENCOUNTER — Ambulatory Visit (INDEPENDENT_AMBULATORY_CARE_PROVIDER_SITE_OTHER): Payer: Medicare Other | Admitting: Surgical

## 2020-10-12 ENCOUNTER — Other Ambulatory Visit: Payer: Self-pay

## 2020-10-12 VITALS — BP 143/85 | HR 85 | Ht 64.0 in | Wt 162.0 lb

## 2020-10-12 DIAGNOSIS — T8543XA Leakage of breast prosthesis and implant, initial encounter: Secondary | ICD-10-CM

## 2020-10-12 MED ORDER — ONDANSETRON HCL 4 MG PO TABS: 4.0000 mg | ORAL_TABLET | Freq: Three times a day (TID) | ORAL | 0 refills | Status: DC | PRN

## 2020-10-12 MED ORDER — HYDROCODONE-ACETAMINOPHEN 5-325 MG PO TABS: 1.0000 | ORAL_TABLET | Freq: Four times a day (QID) | ORAL | 0 refills | Status: AC | PRN

## 2020-10-12 MED ORDER — CEPHALEXIN 500 MG PO CAPS: 500.0000 mg | ORAL_CAPSULE | Freq: Four times a day (QID) | ORAL | 0 refills | Status: AC

## 2020-10-12 NOTE — H&P (View-Only) (Signed)
Patient ID: Isabella Valencia, female    DOB: 04-08-1941, 80 y.o.   MRN: [id number]  Chief Complaint  Patient presents with  . Pre-op Exam      ICD-10-CM   1. Breast implant leak, initial encounter  T85.43XA     History of Present Illness: Isabella Valencia is a 80 y.o.  female  with a history of  placement of implants.  She presents for preoperative evaluation for upcoming procedure, bilateral breast implant removal, bilateral capsulectomies, scheduled for 10/25/2020 with Dr. Marla Roe.  The patient has not had problems with anesthesia. No history of DVT/PE.  No family history of DVT/PE.  No family or personal history of bleeding or clotting disorders.  Patient is not currently taking any blood thinners.  No history of CVA/MI.   Summary of Previous Visit: Patient had smooth saline implants placed 27 years ago, due to rupture they were placed in the patient was 80 years old.  Her last mammogram was April 2015.  She was a 30 to a prior to the implant placement is now 30 4P.  The right implant is ruptured.  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  She is in good health.  She is not a diabetic or smoker.  She does not want the implants replaced.  PMH Significant for: RBBB, mitral regurgitation, asthma, hx of anesthesia reaction. Patient reports in the past she has been slow to wake from anesthesia, but no other issues with anesthesia. She reports that she is feeling well, no recent fevers, chills, nausea, vomiting, dizziness, weakness.  She reports today she is not interested in the mastopexy, she would like to only have the implants removed.  Past Medical History: Allergies: Allergies  Allergen Reactions  . Triamcinolone Acetonide     Soft tissue atrophy with injection of Kenalog  . Adhesive [Tape] Rash    Tears skin, Please use "paper" tape    Current Medications:  Current Outpatient Medications:  .  Calcium Citrate-Vitamin D (CALCIUM + D PO), Take 1 tablet by  mouth daily., Disp: , Rfl:  .  diclofenac sodium (VOLTAREN) 1 % GEL, Apply 1 application topically daily as needed (knee pain)., Disp: , Rfl:  .  Multiple Vitamin (ONE-A-DAY ESSENTIAL) TABS, Take 1 tablet by mouth every morning. , Disp: , Rfl:  .  prednisoLONE acetate (PRED FORTE) 1 % ophthalmic suspension, prednisolone acetate 1 % eye drops,suspension  1 DROP IN RIGHT EYE TWICE A DAY USE FOR 5 DAYS AFTER LASER PROCEDURE, Disp: , Rfl:  .  Turmeric Curcumin 500 MG CAPS, Takes one daily (Patient taking differently: Take 500 mg by mouth daily. Takes one daily), Disp: , Rfl:   Past Medical Problems: Past Medical History:  Diagnosis Date  . Arthritis 2004   neg ANA & RA  . Asthma    minimal problems  . Cervical disc disease   . Complication of anesthesia    reports had toruble waking after general anes for hysterectomy.  no troubles with anesthesia with the R TKR in 2016   . Difficulty sleeping    due to knee pain  . Heart murmur   . History of skin cancer   . Hyperlipidemia   . MVP (mitral valve prolapse)    reports this is the cause of her murmur. denies any cardiac sx related to  MVP . does not have a cardiologist . reporst she gets her annual check ups of her heart at her pcp  office   . Pre-diabetes    hgba1c 11-22-2018 5.8%, patient states that she feels more like her blood sugar drops than it being high , gets dizzy idf she doesnt eat     Past Surgical History: Past Surgical History:  Procedure Laterality Date  . ABDOMINAL HYSTERECTOMY    . AUGMENTATION MAMMAPLASTY    . BREAST ENHANCEMENT SURGERY Bilateral   . CATARACT EXTRACTION    . COLONOSCOPY     every 5 years ; Dr Earlean Shawl  . cosmetic eye surgery    . ELBOW SURGERY      X3  . KNEE ARTHROSCOPY     rt knee  . TOTAL HIP ARTHROPLASTY Right 12/10/2018   Procedure: TOTAL HIP ARTHROPLASTY ANTERIOR APPROACH;  Surgeon: Paralee Cancel, MD;  Location: [facility];  Service: Orthopedics;  Laterality: Right;  70 mins  . TOTAL KNEE  ARTHROPLASTY Right 10/06/2014   Procedure: RIGHT TOTAL KNEE ARTHROPLASTY;  Surgeon: Paralee Cancel, MD;  Location: [facility];  Service: Orthopedics;  Laterality: Right;  . TUBAL LIGATION      Social History: Social History   Socioeconomic History  . Marital status: Divorced    Spouse name: Not on file  . Number of children: Not on file  . Years of education: Not on file  . Highest education level: Not on file  Occupational History  . Not on file  Tobacco Use  . Smoking status: Never Smoker  . Smokeless tobacco: Never Used  Substance and Sexual Activity  . Alcohol use: Not Currently    Alcohol/week: 0.0 standard drinks    Comment:  wine socially;  none since fall 2019  . Drug use: No  . Sexual activity: Not on file  Other Topics Concern  . Not on file  Social History Narrative   No regular exercise   Social Determinants of Health   Financial Resource Strain: Not on file  Food Insecurity: Not on file  Transportation Needs: Not on file  Physical Activity: Not on file  Stress: Not on file  Social Connections: Not on file  Intimate Partner Violence: Not on file    Family History: Family History  Problem Relation Age of Onset  . Arthritis Mother   . Colon cancer Mother   . Stroke Father   . Colon cancer Maternal Aunt   . Colon cancer Maternal Grandmother   . Colon polyps Brother     Review of Systems: Review of Systems  Constitutional: Negative.   Respiratory: Negative.   Cardiovascular: Negative.   Gastrointestinal: Negative.   Neurological: Negative.     Physical Exam: Vital Signs BP (!) 143/85 (BP Location: Left Arm, Patient Position: Sitting, Cuff Size: Normal)   Pulse 85   Ht 5\' 4"  (1.626 m)   Wt 162 lb (73.5 kg)   SpO2 95%   BMI 27.81 kg/m   Physical Exam Constitutional:      General: Not in acute distress.    Appearance: Normal appearance. Not ill-appearing.  HENT:     Head: Normocephalic and atraumatic.  Eyes:     Pupils: Pupils are equal,  round Neck:     Musculoskeletal: Normal range of motion.  Cardiovascular:     Rate and Rhythm: Normal rate    Pulses: Normal pulses.  Pulmonary:     Effort: Pulmonary effort is normal. No respiratory distress.  Abdominal:     General: Abdomen is flat. There is no distension.  Musculoskeletal: Normal range of motion.  Skin:    General: Skin  is warm and dry.     Findings: No erythema or rash.  Neurological:     General: No focal deficit present.     Mental Status: Alert and oriented to person, place, and time. Mental status is at baseline.     Motor: No weakness.  Psychiatric:        Mood and Affect: Mood normal.        Behavior: Behavior normal.    Assessment/Plan: The patient is scheduled for Bilateral breast implant removal, bilateral capsulectomies with Dr. Marla Roe.  Risks, benefits, and alternatives of procedure discussed, questions answered and consent obtained.    Smoking Status: Non-smoker; Counseling Given?  N/A Last Mammogram: 09/28/2020; Results: Apparent rupture of the right saline implant, no mammographic evidence of malignancy  Caprini Score: 6, high; Risk Factors include: Age, BMI greater than 25, and length of planned surgery. Recommendation for mechanical and pharmacological prophylaxis. Encourage early ambulation.   Pictures obtained: 08/16/2020  Post-op Rx sent to pharmacy: Norco, Zofran, Keflex  Patient was provided with the General Surgical Risk consent document and Pain Medication Agreement prior to their appointment.  They had adequate time to read through the risk consent documents and Pain Medication Agreement. We also discussed them in person together during this preop appointment. All of their questions were answered to their satisfaction.  Recommended calling if they have any further questions.  Risk consent form and Pain Medication Agreement to be scanned into patient's chart.  We discussed the use of postoperative drains.  All the patient's questions  were answered in regards to the scheduled surgery.  Electronically signed by: Carola Rhine Dieter Hane, PA-C 10/12/2020 10:28 AM

## 2020-10-12 NOTE — Progress Notes (Signed)
Patient ID: Isabella Valencia, female    DOB: Mar 27, 1941, 80 y.o.   MRN: [id number]  Chief Complaint  Patient presents with  . Pre-op Exam      ICD-10-CM   1. Breast implant leak, initial encounter  T85.43XA     History of Present Illness: Isabella Valencia is a 80 y.o.  female  with a history of  placement of implants.  She presents for preoperative evaluation for upcoming procedure, bilateral breast implant removal, bilateral capsulectomies, scheduled for 10/25/2020 with Dr. Marla Roe.  The patient has not had problems with anesthesia. No history of DVT/PE.  No family history of DVT/PE.  No family or personal history of bleeding or clotting disorders.  Patient is not currently taking any blood thinners.  No history of CVA/MI.   Summary of Previous Visit: Patient had smooth saline implants placed 27 years ago, due to rupture they were placed in the patient was 80 years old.  Her last mammogram was April 2015.  She was a 30 to a prior to the implant placement is now 30 4P.  The right implant is ruptured.  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  She is in good health.  She is not a diabetic or smoker.  She does not want the implants replaced.  PMH Significant for: RBBB, mitral regurgitation, asthma, hx of anesthesia reaction. Patient reports in the past she has been slow to wake from anesthesia, but no other issues with anesthesia. She reports that she is feeling well, no recent fevers, chills, nausea, vomiting, dizziness, weakness.  She reports today she is not interested in the mastopexy, she would like to only have the implants removed.  Past Medical History: Allergies: Allergies  Allergen Reactions  . Triamcinolone Acetonide     Soft tissue atrophy with injection of Kenalog  . Adhesive [Tape] Rash    Tears skin, Please use "paper" tape    Current Medications:  Current Outpatient Medications:  .  Calcium Citrate-Vitamin D (CALCIUM + D PO), Take 1 tablet by  mouth daily., Disp: , Rfl:  .  diclofenac sodium (VOLTAREN) 1 % GEL, Apply 1 application topically daily as needed (knee pain)., Disp: , Rfl:  .  Multiple Vitamin (ONE-A-DAY ESSENTIAL) TABS, Take 1 tablet by mouth every morning. , Disp: , Rfl:  .  prednisoLONE acetate (PRED FORTE) 1 % ophthalmic suspension, prednisolone acetate 1 % eye drops,suspension  1 DROP IN RIGHT EYE TWICE A DAY USE FOR 5 DAYS AFTER LASER PROCEDURE, Disp: , Rfl:  .  Turmeric Curcumin 500 MG CAPS, Takes one daily (Patient taking differently: Take 500 mg by mouth daily. Takes one daily), Disp: , Rfl:   Past Medical Problems: Past Medical History:  Diagnosis Date  . Arthritis 2004   neg ANA & RA  . Asthma    minimal problems  . Cervical disc disease   . Complication of anesthesia    reports had toruble waking after general anes for hysterectomy.  no troubles with anesthesia with the R TKR in 2016   . Difficulty sleeping    due to knee pain  . Heart murmur   . History of skin cancer   . Hyperlipidemia   . MVP (mitral valve prolapse)    reports this is the cause of her murmur. denies any cardiac sx related to  MVP . does not have a cardiologist . reporst she gets her annual check ups of her heart at her pcp  office   . Pre-diabetes    hgba1c 11-22-2018 5.8%, patient states that she feels more like her blood sugar drops than it being high , gets dizzy idf she doesnt eat     Past Surgical History: Past Surgical History:  Procedure Laterality Date  . ABDOMINAL HYSTERECTOMY    . AUGMENTATION MAMMAPLASTY    . BREAST ENHANCEMENT SURGERY Bilateral   . CATARACT EXTRACTION    . COLONOSCOPY     every 5 years ; Dr Earlean Shawl  . cosmetic eye surgery    . ELBOW SURGERY      X3  . KNEE ARTHROSCOPY     rt knee  . TOTAL HIP ARTHROPLASTY Right 12/10/2018   Procedure: TOTAL HIP ARTHROPLASTY ANTERIOR APPROACH;  Surgeon: Paralee Cancel, MD;  Location: [facility];  Service: Orthopedics;  Laterality: Right;  70 mins  . TOTAL KNEE  ARTHROPLASTY Right 10/06/2014   Procedure: RIGHT TOTAL KNEE ARTHROPLASTY;  Surgeon: Paralee Cancel, MD;  Location: [facility];  Service: Orthopedics;  Laterality: Right;  . TUBAL LIGATION      Social History: Social History   Socioeconomic History  . Marital status: Divorced    Spouse name: Not on file  . Number of children: Not on file  . Years of education: Not on file  . Highest education level: Not on file  Occupational History  . Not on file  Tobacco Use  . Smoking status: Never Smoker  . Smokeless tobacco: Never Used  Substance and Sexual Activity  . Alcohol use: Not Currently    Alcohol/week: 0.0 standard drinks    Comment:  wine socially;  none since fall 2019  . Drug use: No  . Sexual activity: Not on file  Other Topics Concern  . Not on file  Social History Narrative   No regular exercise   Social Determinants of Health   Financial Resource Strain: Not on file  Food Insecurity: Not on file  Transportation Needs: Not on file  Physical Activity: Not on file  Stress: Not on file  Social Connections: Not on file  Intimate Partner Violence: Not on file    Family History: Family History  Problem Relation Age of Onset  . Arthritis Mother   . Colon cancer Mother   . Stroke Father   . Colon cancer Maternal Aunt   . Colon cancer Maternal Grandmother   . Colon polyps Brother     Review of Systems: Review of Systems  Constitutional: Negative.   Respiratory: Negative.   Cardiovascular: Negative.   Gastrointestinal: Negative.   Neurological: Negative.     Physical Exam: Vital Signs BP (!) 143/85 (BP Location: Left Arm, Patient Position: Sitting, Cuff Size: Normal)   Pulse 85   Ht 5\' 4"  (1.626 m)   Wt 162 lb (73.5 kg)   SpO2 95%   BMI 27.81 kg/m   Physical Exam Constitutional:      General: Not in acute distress.    Appearance: Normal appearance. Not ill-appearing.  HENT:     Head: Normocephalic and atraumatic.  Eyes:     Pupils: Pupils are equal,  round Neck:     Musculoskeletal: Normal range of motion.  Cardiovascular:     Rate and Rhythm: Normal rate    Pulses: Normal pulses.  Pulmonary:     Effort: Pulmonary effort is normal. No respiratory distress.  Abdominal:     General: Abdomen is flat. There is no distension.  Musculoskeletal: Normal range of motion.  Skin:    General: Skin  is warm and dry.     Findings: No erythema or rash.  Neurological:     General: No focal deficit present.     Mental Status: Alert and oriented to person, place, and time. Mental status is at baseline.     Motor: No weakness.  Psychiatric:        Mood and Affect: Mood normal.        Behavior: Behavior normal.    Assessment/Plan: The patient is scheduled for Bilateral breast implant removal, bilateral capsulectomies with Dr. Marla Roe.  Risks, benefits, and alternatives of procedure discussed, questions answered and consent obtained.    Smoking Status: Non-smoker; Counseling Given?  N/A Last Mammogram: 09/28/2020; Results: Apparent rupture of the right saline implant, no mammographic evidence of malignancy  Caprini Score: 6, high; Risk Factors include: Age, BMI greater than 25, and length of planned surgery. Recommendation for mechanical and pharmacological prophylaxis. Encourage early ambulation.   Pictures obtained: 08/16/2020  Post-op Rx sent to pharmacy: Norco, Zofran, Keflex  Patient was provided with the General Surgical Risk consent document and Pain Medication Agreement prior to their appointment.  They had adequate time to read through the risk consent documents and Pain Medication Agreement. We also discussed them in person together during this preop appointment. All of their questions were answered to their satisfaction.  Recommended calling if they have any further questions.  Risk consent form and Pain Medication Agreement to be scanned into patient's chart.  We discussed the use of postoperative drains.  All the patient's questions  were answered in regards to the scheduled surgery.  Electronically signed by: Carola Rhine Hamda Klutts, PA-C 10/12/2020 10:28 AM

## 2020-10-21 ENCOUNTER — Other Ambulatory Visit (HOSPITAL_COMMUNITY)
Admission: RE | Admit: 2020-10-21 | Discharge: 2020-10-21 | Disposition: A | Discharge status: Home / Self Care | Payer: Medicare Other | Source: Ambulatory Visit | Attending: Plastic Surgery | Admitting: Plastic Surgery

## 2020-10-21 ENCOUNTER — Other Ambulatory Visit (HOSPITAL_COMMUNITY): Payer: Medicare Other

## 2020-10-21 DIAGNOSIS — Z01812 Encounter for preprocedural laboratory examination: Secondary | ICD-10-CM | POA: Insufficient documentation

## 2020-10-21 DIAGNOSIS — Z20822 Contact with and (suspected) exposure to covid-19: Secondary | ICD-10-CM | POA: Insufficient documentation

## 2020-10-21 LAB — SARS CORONAVIRUS 2 (TAT 6-24 HRS): SARS Coronavirus 2: NEGATIVE

## 2020-10-22 ENCOUNTER — Encounter (HOSPITAL_COMMUNITY): Payer: Self-pay | Admitting: Plastic Surgery

## 2020-10-22 NOTE — Anesthesia Preprocedure Evaluation (Addendum)
Anesthesia Evaluation  Patient identified by MRN, date of birth, ID band Patient awake    Reviewed: Allergy & Precautions, NPO status , Patient's Chart, lab work & pertinent test results  History of Anesthesia Complications (+) PROLONGED EMERGENCE and history of anesthetic complications  Airway Mallampati: III  TM Distance: >3 FB Neck ROM: Full    Dental no notable dental hx.    Pulmonary asthma ,    Pulmonary exam normal breath sounds clear to auscultation       Cardiovascular negative cardio ROS Normal cardiovascular exam Rhythm:Regular Rate:Normal  ECG: rate 62   Neuro/Psych negative neurological ROS  negative psych ROS   GI/Hepatic negative GI ROS, Neg liver ROS,   Endo/Other  negative endocrine ROS  Renal/GU negative Renal ROS     Musculoskeletal  (+) Arthritis ,   Abdominal   Peds  Hematology negative hematology ROS (+)   Anesthesia Other Findings Breast implant leak  Reproductive/Obstetrics                            Anesthesia Physical Anesthesia Plan  ASA: II  Anesthesia Plan: General   Post-op Pain Management:    Induction: Intravenous  PONV Risk Score and Plan: 3 and Ondansetron, Dexamethasone, Amisulpride and Treatment may vary due to age or medical condition  Airway Management Planned: LMA  Additional Equipment:   Intra-op Plan:   Post-operative Plan: Extubation in OR  Informed Consent: I have reviewed the patients History and Physical, chart, labs and discussed the procedure including the risks, benefits and alternatives for the proposed anesthesia with the patient or authorized representative who has indicated his/her understanding and acceptance.     Dental advisory given  Plan Discussed with: CRNA  Anesthesia Plan Comments: (Reviewed PAT note written 10/22/2020 by Myra Gianotti, PA-C. )      Anesthesia Quick Evaluation

## 2020-10-22 NOTE — Progress Notes (Signed)
Anesthesia Chart Review: SAME DAY WORK-UP   Case: [id number] Date/Time: 10/25/20 0815   Procedures:      REMOVAL BREAST IMPLANTS (Bilateral Breast) - 90 min, please     CAPSULECTOMY (Bilateral Breast)   Anesthesia type: General   Pre-op diagnosis: breast implant leak   Location: [facility] OR ROOM 08 / [place] [facility]   Surgeons: Wallace Going, DO      DISCUSSION: Patient is an 80 year old female scheduled for the above procedure. Notes indicate she had saline breast implants last placed ~ 27 years ago, right implant now ruptured and left with capsular contraction. She does not want implants replaced once removed.   History includes never smoker, HLD, murmur ("MVP", asymptomatic mild-moderate MR 02/28/16 echo), asthma, skin cancer (melanoma), cervical disc disease, pre-diabetes, loose stools/chronic diarrhea (started 2020 after THR, stool culture/C diff negative 04/2019), TAH/BSO (10/09/01), TKA (right with spinal 10/06/14), THA (right 12/10/18 with spinal). She reported trouble waking up after 2003 TAH, anesthesia records not readily available but no apparent complications note per discharge summary.   Last visit with PCP Binnie Rail, MD on 07/07/20. She referred patient to Plastic Surgery for breast implant removal.   Patient is a same day work-up. Last EKG and labs are > 1 year ago. She is for anesthesia team evaluation on the day of surgery. 10/21/20 COVID-19 test negative.    VS:  BP Readings from Last 3 Encounters:  10/12/20 (!) 143/85  08/16/20 138/89  07/07/20 122/74   Pulse Readings from Last 3 Encounters:  10/12/20 85  08/16/20 92  07/07/20 82    PROVIDERS: Binnie Rail, MD is PCP  Richmond Campbell, MD is GI   LABS: For day of surgery. Last labs noted are from 04/2019.    IMAGES: Mammogram 09/28/20: IMPRESSION: 1.  Apparent rupture of the right saline implant. 2.  No mammographic evidence of malignancy in the bilateral breasts.   EKG: Last EKG noted was from 02/26/16 and showed  SR, short PR, RBBB, non-specific inferolateral T wave changes. She had a non-ischemic stress test at that time (02/28/16).    CV: Echo 02/28/2016 Study Conclusions - Left ventricle: The cavity size was normal. Systolic function was normal. Wall motion was normal; there were no regional wall motion abnormalities. - Aortic valve: Mildly calcified annulus. Trileaflet; normal thickness leaflets. - Mitral valve: Mildly calcified annulus. There was mild to moderate regurgitation.  Nuclear stress test 02/28/2016  Horizontal ST segment depression ST segment depression of 1 mm was noted during stress in the II, aVF, V4 and V5 leads.  This is a low risk study. No ischemia identified  Nuclear stress EF: 60%.    Past Medical History:  Diagnosis Date  . Anemia    in her younger years (esp during pregnancy)  . Arthritis 2004   neg ANA & RA  . Asthma    minimal problems  . Cancer (HCC)    melanoma - upper arm   . Cervical disc disease   . Complication of anesthesia    reports had toruble waking after general anes for hysterectomy.  no troubles with anesthesia with the R TKR in 2016   . Difficulty sleeping    due to knee pain  . Heart murmur   . History of skin cancer   . Hyperlipidemia   . Loose bowel movements    gassy, onset after hip surgery  . MVP (mitral valve prolapse)    reports this is the cause of her murmur.  denies any cardiac sx related to  MVP . does not have a cardiologist . reporst she gets her annual check ups of her heart at her pcp office   . Pneumonia    in her 28's  . Pre-diabetes    hgba1c 11-22-2018 5.8%, patient states that she feels more like her blood sugar drops than it being high , gets dizzy idf she doesnt eat - Pt denies being pre-diabetic    Past Surgical History:  Procedure Laterality Date  . ABDOMINAL HYSTERECTOMY    . AUGMENTATION MAMMAPLASTY    . BREAST ENHANCEMENT SURGERY Bilateral   . CATARACT EXTRACTION    . COLONOSCOPY     every 5  years ; Dr Earlean Shawl  . cosmetic eye surgery    . ELBOW SURGERY      X3  . KNEE ARTHROSCOPY     rt knee  . TOTAL HIP ARTHROPLASTY Right 12/10/2018   Procedure: TOTAL HIP ARTHROPLASTY ANTERIOR APPROACH;  Surgeon: Paralee Cancel, MD;  Location: [facility];  Service: Orthopedics;  Laterality: Right;  70 mins  . TOTAL KNEE ARTHROPLASTY Right 10/06/2014   Procedure: RIGHT TOTAL KNEE ARTHROPLASTY;  Surgeon: Paralee Cancel, MD;  Location: [facility];  Service: Orthopedics;  Laterality: Right;  . TUBAL LIGATION      MEDICATIONS: No current facility-administered medications for this encounter.   . diclofenac sodium (VOLTAREN) 1 % GEL  . Multiple Minerals-Vitamins (CALCIUM 600+D3 PLUS MINERALS) TABS  . Multiple Vitamin (MULTIVITAMIN WITH MINERALS) TABS tablet  . prednisoLONE acetate (PRED FORTE) 1 % ophthalmic suspension  . Turmeric 450 MG CAPS  . ondansetron (ZOFRAN) 4 MG tablet  . psyllium (METAMUCIL SMOOTH TEXTURE) 28 % packet    Myra Gianotti, PA-C Surgical Short Stay/Anesthesiology [facility] Phone [phone] [facility] Phone [phone] 10/22/2020 1:26 PM

## 2020-10-22 NOTE — Progress Notes (Addendum)
Spoke with pt for pre-op call. Pt has a remote hx of Mitral valve prolapse. She states her PCP has not even mentioned since she has been going to her. She denies HTN or any other cardiac history. Pt is pre-diabetic (she denies this but she had a A1C of 5.8 in 2020).   Pt's PCP is Billey Gosling  Covid test done 10/21/20 and it's negative. Pt states she's been in quarantine since the test was done and understands that she stays in quarantine until she comes to the hospital on Monday.

## 2020-10-25 ENCOUNTER — Other Ambulatory Visit: Payer: Self-pay

## 2020-10-25 ENCOUNTER — Ambulatory Visit (HOSPITAL_COMMUNITY)
Admission: RE | Admit: 2020-10-25 | Discharge: 2020-10-25 | Disposition: A | Discharge status: Home / Self Care | Payer: Medicare Other | Attending: Plastic Surgery | Admitting: Plastic Surgery

## 2020-10-25 ENCOUNTER — Encounter (HOSPITAL_COMMUNITY): Payer: Self-pay | Admitting: Plastic Surgery

## 2020-10-25 ENCOUNTER — Ambulatory Visit (HOSPITAL_COMMUNITY): Payer: Medicare Other | Admitting: Vascular Surgery

## 2020-10-25 ENCOUNTER — Encounter (HOSPITAL_COMMUNITY)
Admission: RE | Disposition: A | Discharge status: Home / Self Care | Payer: Self-pay | Source: Home / Self Care | Attending: Plastic Surgery

## 2020-10-25 DIAGNOSIS — Z800 Family history of malignant neoplasm of digestive organs: Secondary | ICD-10-CM | POA: Insufficient documentation

## 2020-10-25 DIAGNOSIS — Z96641 Presence of right artificial hip joint: Secondary | ICD-10-CM | POA: Insufficient documentation

## 2020-10-25 DIAGNOSIS — T8549XA Other mechanical complication of breast prosthesis and implant, initial encounter: Secondary | ICD-10-CM | POA: Insufficient documentation

## 2020-10-25 DIAGNOSIS — R7303 Prediabetes: Secondary | ICD-10-CM | POA: Diagnosis not present

## 2020-10-25 DIAGNOSIS — Z85828 Personal history of other malignant neoplasm of skin: Secondary | ICD-10-CM | POA: Diagnosis not present

## 2020-10-25 DIAGNOSIS — Z809 Family history of malignant neoplasm, unspecified: Secondary | ICD-10-CM | POA: Insufficient documentation

## 2020-10-25 DIAGNOSIS — X58XXXA Exposure to other specified factors, initial encounter: Secondary | ICD-10-CM | POA: Diagnosis not present

## 2020-10-25 DIAGNOSIS — Z45812 Encounter for adjustment or removal of left breast implant: Secondary | ICD-10-CM | POA: Diagnosis not present

## 2020-10-25 DIAGNOSIS — Z96651 Presence of right artificial knee joint: Secondary | ICD-10-CM | POA: Insufficient documentation

## 2020-10-25 DIAGNOSIS — T8579XA Infection and inflammatory reaction due to other internal prosthetic devices, implants and grafts, initial encounter: Secondary | ICD-10-CM | POA: Diagnosis not present

## 2020-10-25 DIAGNOSIS — T8543XA Leakage of breast prosthesis and implant, initial encounter: Secondary | ICD-10-CM | POA: Diagnosis not present

## 2020-10-25 DIAGNOSIS — Z888 Allergy status to other drugs, medicaments and biological substances status: Secondary | ICD-10-CM | POA: Diagnosis not present

## 2020-10-25 DIAGNOSIS — I4510 Unspecified right bundle-branch block: Secondary | ICD-10-CM | POA: Diagnosis not present

## 2020-10-25 DIAGNOSIS — J4520 Mild intermittent asthma, uncomplicated: Secondary | ICD-10-CM | POA: Diagnosis not present

## 2020-10-25 DIAGNOSIS — T8542XA Displacement of breast prosthesis and implant, initial encounter: Secondary | ICD-10-CM | POA: Diagnosis not present

## 2020-10-25 DIAGNOSIS — Z45811 Encounter for adjustment or removal of right breast implant: Secondary | ICD-10-CM | POA: Diagnosis not present

## 2020-10-25 DIAGNOSIS — Z90710 Acquired absence of both cervix and uterus: Secondary | ICD-10-CM | POA: Insufficient documentation

## 2020-10-25 DIAGNOSIS — E785 Hyperlipidemia, unspecified: Secondary | ICD-10-CM | POA: Insufficient documentation

## 2020-10-25 HISTORY — DX: Malignant (primary) neoplasm, unspecified: C80.1

## 2020-10-25 HISTORY — DX: Other fecal abnormalities: R19.5

## 2020-10-25 HISTORY — DX: Anemia, unspecified: D64.9

## 2020-10-25 HISTORY — PX: BREAST IMPLANT REMOVAL: SHX5361

## 2020-10-25 HISTORY — PX: CAPSULECTOMY: SHX5381

## 2020-10-25 HISTORY — DX: Pneumonia, unspecified organism: J18.9

## 2020-10-25 LAB — CBC
HCT: 36.5 % (ref 36.0–46.0)
Hemoglobin: 11.9 g/dL — ABNORMAL LOW (ref 12.0–15.0)
MCH: 32.8 pg (ref 26.0–34.0)
MCHC: 32.6 g/dL (ref 30.0–36.0)
MCV: 100.6 fL — ABNORMAL HIGH (ref 80.0–100.0)
Platelets: 228 10*3/uL (ref 150–400)
RBC: 3.63 MIL/uL — ABNORMAL LOW (ref 3.87–5.11)
RDW: 12.9 % (ref 11.5–15.5)
WBC: 5.6 10*3/uL (ref 4.0–10.5)
nRBC: 0 % (ref 0.0–0.2)

## 2020-10-25 LAB — GLUCOSE, CAPILLARY: Glucose-Capillary: 102 mg/dL — ABNORMAL HIGH (ref 70–99)

## 2020-10-25 LAB — BASIC METABOLIC PANEL
Anion gap: 8 (ref 5–15)
BUN: 22 mg/dL (ref 8–23)
CO2: 25 mmol/L (ref 22–32)
Calcium: 9.2 mg/dL (ref 8.9–10.3)
Chloride: 106 mmol/L (ref 98–111)
Creatinine, Ser: 0.96 mg/dL (ref 0.44–1.00)
GFR, Estimated: 60 mL/min — ABNORMAL LOW (ref >60)
Glucose, Bld: 89 mg/dL (ref 70–99)
Potassium: 3.9 mmol/L (ref 3.5–5.1)
Sodium: 139 mmol/L (ref 135–145)

## 2020-10-25 SURGERY — REMOVAL, IMPLANT, BREAST
Anesthesia: General | Site: Breast | Laterality: Bilateral

## 2020-10-25 MED ORDER — PHENYLEPHRINE HCL-NACL 10-0.9 MG/250ML-% IV SOLN
INTRAVENOUS | Status: DC | PRN
  Administered 2020-10-25: 25 ug/min via INTRAVENOUS

## 2020-10-25 MED ORDER — CHLORHEXIDINE GLUCONATE CLOTH 2 % EX PADS: 6.0000 | MEDICATED_PAD | Freq: Once | CUTANEOUS | Status: DC

## 2020-10-25 MED ORDER — LIDOCAINE-EPINEPHRINE 1 %-1:100000 IJ SOLN
INTRAMUSCULAR | Status: DC | PRN
  Administered 2020-10-25: 10 mL

## 2020-10-25 MED ORDER — FENTANYL CITRATE (PF) 100 MCG/2ML IJ SOLN
INTRAMUSCULAR | Status: DC | PRN
  Administered 2020-10-25 (×2): 25 ug via INTRAVENOUS

## 2020-10-25 MED ORDER — LIDOCAINE 2% (20 MG/ML) 5 ML SYRINGE
INTRAMUSCULAR | Status: DC | PRN
  Administered 2020-10-25: 60 mg via INTRAVENOUS

## 2020-10-25 MED ORDER — 0.9 % SODIUM CHLORIDE (POUR BTL) OPTIME
TOPICAL | Status: DC | PRN
  Administered 2020-10-25 (×2): 1000 mL

## 2020-10-25 MED ORDER — ONDANSETRON HCL 4 MG/2ML IJ SOLN
INTRAMUSCULAR | Status: AC
  Filled 2020-10-25: qty 2

## 2020-10-25 MED ORDER — CEFAZOLIN SODIUM-DEXTROSE 2-4 GM/100ML-% IV SOLN
2.0000 g | INTRAVENOUS | Status: AC
  Administered 2020-10-25: 2 g via INTRAVENOUS
  Filled 2020-10-25: qty 100

## 2020-10-25 MED ORDER — FENTANYL CITRATE (PF) 100 MCG/2ML IJ SOLN
INTRAMUSCULAR | Status: AC
  Filled 2020-10-25: qty 2

## 2020-10-25 MED ORDER — CHLORHEXIDINE GLUCONATE 0.12 % MT SOLN
15.0000 mL | Freq: Once | OROMUCOSAL | Status: AC
  Administered 2020-10-25: 15 mL via OROMUCOSAL
  Filled 2020-10-25: qty 15

## 2020-10-25 MED ORDER — FENTANYL CITRATE (PF) 100 MCG/2ML IJ SOLN
25.0000 ug | INTRAMUSCULAR | Status: DC | PRN
  Administered 2020-10-25 (×2): 25 ug via INTRAVENOUS

## 2020-10-25 MED ORDER — ONDANSETRON HCL 4 MG/2ML IJ SOLN
INTRAMUSCULAR | Status: DC | PRN
  Administered 2020-10-25: 4 mg via INTRAVENOUS

## 2020-10-25 MED ORDER — FENTANYL CITRATE (PF) 250 MCG/5ML IJ SOLN
INTRAMUSCULAR | Status: AC
  Filled 2020-10-25: qty 5

## 2020-10-25 MED ORDER — ACETAMINOPHEN 500 MG PO TABS
1000.0000 mg | ORAL_TABLET | Freq: Once | ORAL | Status: AC
  Administered 2020-10-25: 1000 mg via ORAL
  Filled 2020-10-25: qty 2

## 2020-10-25 MED ORDER — PROPOFOL 10 MG/ML IV BOLUS
INTRAVENOUS | Status: DC | PRN
  Administered 2020-10-25: 200 mg via INTRAVENOUS

## 2020-10-25 MED ORDER — CEPHALEXIN 500 MG PO CAPS: 500.0000 mg | ORAL_CAPSULE | Freq: Four times a day (QID) | ORAL | 0 refills | Status: AC

## 2020-10-25 MED ORDER — BUPIVACAINE HCL 0.25 % IJ SOLN
INTRAMUSCULAR | Status: DC | PRN
  Administered 2020-10-25: 10 mL

## 2020-10-25 MED ORDER — PHENYLEPHRINE 40 MCG/ML (10ML) SYRINGE FOR IV PUSH (FOR BLOOD PRESSURE SUPPORT)
PREFILLED_SYRINGE | INTRAVENOUS | Status: DC | PRN
  Administered 2020-10-25 (×3): 80 ug via INTRAVENOUS

## 2020-10-25 MED ORDER — BUPIVACAINE HCL (PF) 0.25 % IJ SOLN
INTRAMUSCULAR | Status: AC
  Filled 2020-10-25: qty 30

## 2020-10-25 MED ORDER — ONDANSETRON HCL 4 MG/2ML IJ SOLN
4.0000 mg | Freq: Once | INTRAMUSCULAR | Status: AC | PRN
  Administered 2020-10-25: 4 mg via INTRAVENOUS

## 2020-10-25 MED ORDER — AMISULPRIDE (ANTIEMETIC) 5 MG/2ML IV SOLN
INTRAVENOUS | Status: AC
  Filled 2020-10-25: qty 2

## 2020-10-25 MED ORDER — AMISULPRIDE (ANTIEMETIC) 5 MG/2ML IV SOLN
INTRAVENOUS | Status: DC | PRN
  Administered 2020-10-25: 5 mg via INTRAVENOUS

## 2020-10-25 MED ORDER — BUPIVACAINE-EPINEPHRINE 0.5% -1:200000 IJ SOLN
INTRAMUSCULAR | Status: AC
  Filled 2020-10-25: qty 1

## 2020-10-25 MED ORDER — HYDROCODONE-ACETAMINOPHEN 5-325 MG PO TABS: 1.0000 | ORAL_TABLET | Freq: Four times a day (QID) | ORAL | 0 refills | Status: AC | PRN

## 2020-10-25 MED ORDER — ONDANSETRON HCL 4 MG PO TABS: 4.0000 mg | ORAL_TABLET | Freq: Three times a day (TID) | ORAL | 0 refills | Status: DC | PRN

## 2020-10-25 MED ORDER — BUPIVACAINE-EPINEPHRINE (PF) 0.25% -1:200000 IJ SOLN
INTRAMUSCULAR | Status: AC
  Filled 2020-10-25: qty 30

## 2020-10-25 MED ORDER — AMISULPRIDE (ANTIEMETIC) 5 MG/2ML IV SOLN: 10.0000 mg | Freq: Once | INTRAVENOUS | Status: DC | PRN

## 2020-10-25 MED ORDER — DEXAMETHASONE SODIUM PHOSPHATE 4 MG/ML IJ SOLN
INTRAMUSCULAR | Status: DC | PRN
  Administered 2020-10-25: 10 mg via INTRAVENOUS

## 2020-10-25 MED ORDER — LIDOCAINE-EPINEPHRINE 1 %-1:100000 IJ SOLN
INTRAMUSCULAR | Status: AC
  Filled 2020-10-25: qty 1

## 2020-10-25 MED ORDER — PROPOFOL 10 MG/ML IV BOLUS
INTRAVENOUS | Status: AC
  Filled 2020-10-25: qty 40

## 2020-10-25 MED ORDER — ORAL CARE MOUTH RINSE: 15.0000 mL | Freq: Once | OROMUCOSAL | Status: AC

## 2020-10-25 MED ORDER — LACTATED RINGERS IV SOLN: INTRAVENOUS | Status: DC

## 2020-10-25 SURGICAL SUPPLY — 57 items
ADH SKN CLS APL DERMABOND .7 (GAUZE/BANDAGES/DRESSINGS) ×2
BAG DECANTER FOR FLEXI CONT (MISCELLANEOUS) ×2 IMPLANT
BINDER BREAST LRG (GAUZE/BANDAGES/DRESSINGS) ×1 IMPLANT
BIOPATCH RED 1 DISK 7.0 (GAUZE/BANDAGES/DRESSINGS) ×2 IMPLANT
BNDG ELASTIC 6X5.8 VLCR STR LF (GAUZE/BANDAGES/DRESSINGS) IMPLANT
CANISTER SUCT 3000ML PPV (MISCELLANEOUS) ×2 IMPLANT
COVER SURGICAL LIGHT HANDLE (MISCELLANEOUS) ×3 IMPLANT
DERMABOND ADVANCED (GAUZE/BANDAGES/DRESSINGS) ×2
DERMABOND ADVANCED .7 DNX12 (GAUZE/BANDAGES/DRESSINGS) IMPLANT
DRAIN CHANNEL 19F RND (DRAIN) ×2 IMPLANT
DRAPE ORTHO SPLIT 77X108 STRL (DRAPES) ×4
DRAPE SURG ORHT 6 SPLT 77X108 (DRAPES) ×2 IMPLANT
DRAPE WARM FLUID 44X44 (DRAPES) ×2 IMPLANT
DRSG OPSITE POSTOP 4X6 (GAUZE/BANDAGES/DRESSINGS) ×2 IMPLANT
DRSG PAD ABDOMINAL 8X10 ST (GAUZE/BANDAGES/DRESSINGS) ×4 IMPLANT
DRSG TEGADERM 4X4.75 (GAUZE/BANDAGES/DRESSINGS) ×2 IMPLANT
ELECT BLADE 4.0 EZ CLEAN MEGAD (MISCELLANEOUS) ×2
ELECT REM PT RETURN 9FT ADLT (ELECTROSURGICAL) ×2
ELECTRODE BLDE 4.0 EZ CLN MEGD (MISCELLANEOUS) IMPLANT
ELECTRODE REM PT RTRN 9FT ADLT (ELECTROSURGICAL) ×1 IMPLANT
EVACUATOR SILICONE 100CC (DRAIN) ×4 IMPLANT
GAUZE SPONGE 4X4 12PLY STRL (GAUZE/BANDAGES/DRESSINGS) ×1 IMPLANT
GLOVE BIO SURGEON STRL SZ 6.5 (GLOVE) ×3 IMPLANT
GLOVE BIOGEL M STRL SZ7.5 (GLOVE) IMPLANT
GLOVE INDICATOR 8.0 STRL GRN (GLOVE) IMPLANT
GOWN STRL REUS W/ TWL LRG LVL3 (GOWN DISPOSABLE) ×3 IMPLANT
GOWN STRL REUS W/TWL LRG LVL3 (GOWN DISPOSABLE) ×6
KIT BASIN OR (CUSTOM PROCEDURE TRAY) ×2 IMPLANT
KIT TURNOVER KIT B (KITS) ×2 IMPLANT
MARKER SKIN DUAL TIP RULER LAB (MISCELLANEOUS) ×2 IMPLANT
NDL HYPO 25GX1X1/2 BEV (NEEDLE) ×1 IMPLANT
NEEDLE HYPO 25GX1X1/2 BEV (NEEDLE) ×2 IMPLANT
NS IRRIG 1000ML POUR BTL (IV SOLUTION) ×2 IMPLANT
PACK GENERAL/GYN (CUSTOM PROCEDURE TRAY) ×2 IMPLANT
PAD ARMBOARD 7.5X6 YLW CONV (MISCELLANEOUS) ×4 IMPLANT
PENCIL SMOKE EVACUATOR (MISCELLANEOUS) ×2 IMPLANT
SOLUTION BETADINE 4OZ (MISCELLANEOUS) ×1 IMPLANT
STAPLER VISISTAT 35W (STAPLE) ×1 IMPLANT
STRIP CLOSURE SKIN 1/2X4 (GAUZE/BANDAGES/DRESSINGS) ×4 IMPLANT
SUT MNCRL AB 3-0 PS2 18 (SUTURE) ×1 IMPLANT
SUT MNCRL AB 4-0 PS2 18 (SUTURE) ×6 IMPLANT
SUT MON AB 3-0 SH 27 (SUTURE) ×2
SUT MON AB 3-0 SH27 (SUTURE) IMPLANT
SUT MON AB 5-0 PS2 18 (SUTURE) ×6 IMPLANT
SUT PDS AB 2-0 CT1 27 (SUTURE) ×2 IMPLANT
SUT PDS AB 3-0 SH 27 (SUTURE) ×1 IMPLANT
SUT PROLENE 3 0 PS 2 (SUTURE) ×2 IMPLANT
SUT SILK 0 FSL (SUTURE) ×2 IMPLANT
SUT SILK 4 0 PS 2 (SUTURE) ×2 IMPLANT
SUT VIC AB 2-0 SH 18 (SUTURE) IMPLANT
SUT VIC AB 3-0 SH 18 (SUTURE) ×2 IMPLANT
SUT VIC AB 3-0 SH 27 (SUTURE)
SUT VIC AB 3-0 SH 27X BRD (SUTURE) ×1 IMPLANT
SYR CONTROL 10ML LL (SYRINGE) ×2 IMPLANT
TOWEL GREEN STERILE (TOWEL DISPOSABLE) ×2 IMPLANT
TOWEL GREEN STERILE FF (TOWEL DISPOSABLE) ×2 IMPLANT
WATER STERILE IRR 1000ML POUR (IV SOLUTION) IMPLANT

## 2020-10-25 NOTE — Transfer of Care (Signed)
Immediate Anesthesia Transfer of Care Note  Patient: Isabella Valencia  Procedure(s) Performed: REMOVAL BREAST IMPLANTS (Bilateral Breast) CAPSULECTOMY (Bilateral Breast)  Patient Location: PACU  Anesthesia Type:General  Level of Consciousness: drowsy  Airway & Oxygen Therapy: Patient Spontanous Breathing and Patient connected to face mask oxygen  Post-op Assessment: Report given to RN and Post -op Vital signs reviewed and stable  Post vital signs: Reviewed and stable  Last Vitals:  Vitals Value Taken Time  BP 137/76 10/25/20 0953  Temp    Pulse 83 10/25/20 0954  Resp 14 10/25/20 0954  SpO2 96 % 10/25/20 0954  Vitals shown include unvalidated device data.  Last Pain:  Vitals:   10/25/20 0636  TempSrc: Oral         Complications: No complications documented.

## 2020-10-25 NOTE — Interval H&P Note (Signed)
History and Physical Interval Note:  10/25/2020 7:35 AM  Isabella Valencia  has presented today for surgery, with the diagnosis of breast implant leak.  The various methods of treatment have been discussed with the patient and family. After consideration of risks, benefits and other options for treatment, the patient has consented to  Procedure(s) with comments: REMOVAL BREAST IMPLANTS (Bilateral) - 90 min, please CAPSULECTOMY (Bilateral) as a surgical intervention.  The patient's history has been reviewed, patient examined, no change in status, stable for surgery.  I have reviewed the patient's chart and labs.  Questions were answered to the patient's satisfaction.     Loel Lofty Storm Sovine

## 2020-10-25 NOTE — Progress Notes (Signed)
50 mcg IV fentanyl wasted in stericycle with Suzan Garibaldi, RN after patient discharge.

## 2020-10-25 NOTE — Op Note (Signed)
DATE OF OPERATION: 10/25/2020  LOCATION: [facility]  PREOPERATIVE DIAGNOSIS: Ruptured breast implants  POSTOPERATIVE DIAGNOSIS: Same  PROCEDURE: Removal of bilateral breast implants and capsulectomies  SURGEON: Lyndee Leo Sanger Levon Boettcher, DO  ASSISTANT: Roetta Sessions, PA  EBL: 25 cc  CONDITION: Stable  COMPLICATIONS: None  INDICATION: The patient, Isabella Valencia, is a 80 y.o. female born on Jan 04, 1941, is here for treatment of ruptured breast implants with the desire to not have any replaced.   PROCEDURE DETAILS:  The patient was seen prior to surgery and marked.  The IV antibiotics were given. The patient was taken to the operating room and given a general anesthetic. A standard time out was performed and all information was confirmed by those in the room. SCDs were placed.   The chest was prepped and draped.  Local with epinephrine was injected at the inframammary fold on each side.  Right:  The #10 blade was used to make an incision at the inframammary fold.  The bovie was used to dissect to the breast capsule.  The capsule was freed from the surrounding breast tissue with the bovie and tissue scissors.  Once it was freed the capsule and implant were removed.  There was white fluid in the capsule. Hemostasis was achieved with electrocautery.  The implant was noted to be ruptured. The pocket was irrigated with saline.  A #15 drain was placed and secured to the chest with the silk suture.  The deep layer was closed with the 3-0 PDS followed by the 3-0 Monocryl.  The skin was closed with the 4-0 Monocryl.    Left::  The #10 blade was used to make an incision at the inframammary fold.  The bovie was used to dissect to the breast capsule.  The capsule was freed from the surrounding breast tissue with the bovie and tissue scissors.  Once it was freed the capsule and implant were removed.  The implant was not ruptured. It was a McGann 210 cc smooth saline implant.   Hemostasis was achieved with electrocautery.  The pocket was irrigated with saline.  A #15 drain was placed and secured to the chest with the silk suture.  The deep layer was closed with the 3-0 PDS followed by the 3-0 Monocryl.  The skin was closed with the 4-0 Monocryl.  Derma bond and steri strips were applied with a sterile dressing to both sides at the end of the case.   The patient was allowed to wake up and taken to recovery room in stable condition at the end of the case. The family was notified at the end of the case.   The advanced practice practitioner (APP) assisted throughout the case.  The APP was essential in retraction and counter traction when needed to make the case progress smoothly.  This retraction and assistance made it possible to see the tissue plans for the procedure.  The assistance was needed for blood control, tissue re-approximation and assisted with closure of the incision site.

## 2020-10-25 NOTE — Discharge Instructions (Signed)
INSTRUCTIONS FOR AFTER SURGERY   You will likely have some questions about what to expect following your operation.  The following information will help you and your family understand what to expect when you are discharged from the hospital.  Following these guidelines will help ensure a smooth recovery and reduce risks of complications.  Postoperative instructions include information on: diet, wound care, medications and physical activity.  AFTER SURGERY Expect to go home after the procedure.  In some cases, you may need to spend one night in the hospital for observation.  DIET This surgery does not require a specific diet.  However, I have to mention that the healthier you eat the better your body can start healing. It is important to increasing your protein intake.  This means limiting the foods with added sugar.  Focus on fruits and vegetables and some meat. It is very important to drink water after your surgery.  If your urine is bright yellow, then it is concentrated, and you need to drink more water.  As a general rule after surgery, you should have 8 ounces of water every hour while awake.  If you find you are persistently nauseated or unable to take in liquids let us know.  NO TOBACCO USE or EXPOSURE.  This will slow your healing process and increase the risk of a wound.  WOUND CARE If you have a drain: Clean with baby wipes for 3-5 days and then you can shower.  If you have a binder you may remove it to shower and then put it back on. If you have steri-strips / tape directly attached to your skin leave them in place. It is OK to get these wet.  No baths, pools or hot tubs for two weeks. We close your incision to leave the smallest and best-looking scar. No ointment or creams on your incisions until given the go ahead.  Especially not Neosporin (Too many skin reactions with this one).  A few weeks after surgery you can use Mederma and start massaging the scar. We ask you to wear your binder or  sports bra for the first 6 weeks around the clock, including while sleeping. This provides added comfort and helps reduce the fluid accumulation at the surgery site.  ACTIVITY No heavy lifting until cleared by the doctor.  It is OK to walk and climb stairs. In fact, moving your legs is very important to decrease your risk of a blood clot.  It will also help keep you from getting deconditioned.  Every 1 to 2 hours get up and walk for 5 minutes. This will help with a quicker recovery back to normal.  Let pain be your guide so you don't do too much.  NO, you cannot do the spring cleaning and don't plan on taking care of anyone else.  This is your time for TLC.   WORK Everyone returns to work at different times. As a rough guide, most people take at least 1 - 2 weeks off prior to returning to work. If you need documentation for your job, bring the forms to your postoperative follow up visit.  DRIVING Arrange for someone to bring you home from the hospital.  You may be able to drive a few days after surgery but not while taking any narcotics or valium.  BOWEL MOVEMENTS Constipation can occur after anesthesia and while taking pain medication.  It is important to stay ahead for your comfort.  We recommend taking Milk of Magnesia (2 tablespoons; twice   a day) while taking the pain pills.  SEROMA This is fluid your body tried to put in the surgical site.  This is normal but if it creates excessive pain and swelling let us know.  It usually decreases in a few weeks.  MEDICATIONS and PAIN CONTROL At your preoperative visit for you history and physical you were given the following medications: 1. An antibiotic: Start this medication when you get home and take according to the instructions on the bottle. 2. Zofran 4 mg:  This is to treat nausea and vomiting.  You can take this every 6 hours as needed and only if needed. 3. Norco (hydrocodone/acetaminophen) 5/325 mg:  This is only to be used after you have  taken the motrin or the tylenol. Every 8 hours as needed. Over the counter Medication to take: 4. Ibuprofen (Motrin) 600 mg:  Take this every 6 hours.  If you have additional pain then take 500 mg of the tylenol.  Only take the Norco after you have tried these two. 5. Miralax or stool softener of choice: Take this according to the bottle if you take the Norco.  WHEN TO CALL Call your surgeon's office if any of the following occur: . Fever 101 degrees F or greater . Excessive bleeding or fluid from the incision site. . Pain that increases over time without aid from the medications . Redness, warmth, or pus draining from incision sites . Persistent nausea or inability to take in liquids . Severe misshapen area that underwent the operation.  

## 2020-10-25 NOTE — Anesthesia Procedure Notes (Signed)
Procedure Name: LMA Insertion Date/Time: 10/25/2020 8:34 AM Performed by: Candis Shine, CRNA Pre-anesthesia Checklist: Patient identified, Emergency Drugs available, Suction available and Patient being monitored Patient Re-evaluated:Patient Re-evaluated prior to induction Oxygen Delivery Method: Circle System Utilized Preoxygenation: Pre-oxygenation with 100% oxygen Induction Type: IV induction Ventilation: Mask ventilation without difficulty LMA: LMA inserted LMA Size: 4.0 Number of attempts: 1 Placement Confirmation: positive ETCO2 Tube secured with: Tape Dental Injury: Teeth and Oropharynx as per pre-operative assessment

## 2020-10-26 ENCOUNTER — Encounter (HOSPITAL_COMMUNITY): Payer: Self-pay | Admitting: Plastic Surgery

## 2020-10-26 LAB — SURGICAL PATHOLOGY

## 2020-10-26 NOTE — Anesthesia Postprocedure Evaluation (Signed)
Anesthesia Post Note  Patient: Isabella Valencia  Procedure(s) Performed: REMOVAL BREAST IMPLANTS (Bilateral Breast) CAPSULECTOMY (Bilateral Breast)     Patient location during evaluation: PACU Anesthesia Type: General Level of consciousness: awake Pain management: pain level controlled Vital Signs Assessment: post-procedure vital signs reviewed and stable Respiratory status: spontaneous breathing, nonlabored ventilation, respiratory function stable and patient connected to nasal cannula oxygen Cardiovascular status: blood pressure returned to baseline and stable Postop Assessment: no apparent nausea or vomiting Anesthetic complications: no   No complications documented.  Last Vitals:  Vitals:   10/25/20 1050 10/25/20 1110  BP: 113/73 113/66  Pulse: 65 63  Resp: 14 10  Temp:  (!) 36.2 C  SpO2: 95% 94%    Last Pain:  Vitals:   10/25/20 1110  TempSrc:   PainSc: 2                  Laurana Magistro P Codi Folkerts

## 2020-11-02 ENCOUNTER — Ambulatory Visit (INDEPENDENT_AMBULATORY_CARE_PROVIDER_SITE_OTHER): Payer: Medicare Other | Admitting: Surgical

## 2020-11-02 ENCOUNTER — Other Ambulatory Visit: Payer: Self-pay

## 2020-11-02 DIAGNOSIS — T8543XA Leakage of breast prosthesis and implant, initial encounter: Secondary | ICD-10-CM

## 2020-11-02 NOTE — Progress Notes (Signed)
Very pleasant 80 year old female here for follow-up after removal of bilateral breast implants with Dr. Marla Roe on 10/25/2020.  She is 1 week postop.  She reports she is overall doing really well.  She has had minimal output from the bilateral JP drains.  She is not having any infectious symptoms.  She reports that she does feel a little bit congested, but attributes this to her allergies.  Chaperone present on exam On exam bilateral NAC's are viable.  Bilateral breast incisions appear intact, Steri-Strips and honeycomb dressing in place without any drainage noted.  Bilateral JP drains in place with less than 5 cc in each bulb.  No erythema noted.  Recommend continue her compressive garment.  Bilateral JP drains removed for low output.  Continue to avoid strenuous activities.  Recommend following up in a few weeks for reevaluation.

## 2020-11-16 ENCOUNTER — Ambulatory Visit (INDEPENDENT_AMBULATORY_CARE_PROVIDER_SITE_OTHER): Payer: Medicare Other | Admitting: Surgical

## 2020-11-16 ENCOUNTER — Other Ambulatory Visit: Payer: Self-pay

## 2020-11-16 DIAGNOSIS — T8543XA Leakage of breast prosthesis and implant, initial encounter: Secondary | ICD-10-CM

## 2020-11-16 NOTE — Progress Notes (Signed)
Patient is an 80 year old female here for follow-up after removal of bilateral breast implants with Dr. Marla Roe on 10/25/2020.  She is 3 weeks postop.  She is doing really well.  She reports that she is very pleased with how things are going.  Chaperone present on exam On exam bilateral breast incisions are intact.  Bilateral NAC's are viable.  Incisions are well-healed.   Recommend continue wear compression garment 24/7 for 3 more weeks.  Continue to avoid strenuous activities for a few more weeks. We discussed scheduling an additional follow-up or following up as needed.  Patient is comfortable following up on an as-needed basis.  There is no sign of infection, seroma, hematoma.  She is doing really well.  I recommend she call us with questions or concerns or if she has any changes that need to be reevaluated.

## 2020-12-12 NOTE — Progress Notes (Signed)
Subjective:    Patient ID: Isabella Valencia, female    DOB: [birth year], 80 y.o.   MRN: [id number]  HPI The patient is here for an acute visit.   Dec hearing left ear -  it started a while a ago.  The left ear hearing has gotten worse - she could not hear on the telephone.  The right feels like sometimes there is something in it - it itches a little at times.    She tried otc ear drops in the left ear - helped a little she thinks- she did this about one week. She denies fever, st, congestion and sinus pain.   Medications and allergies reviewed with patient and updated if appropriate.  Patient Active Problem List   Diagnosis Date Noted   Breast implant leak, initial encounter 07/07/2020   Diarrhea 04/22/2019   S/P right THA, AA 12/10/2018   Hyperglycemia 11/22/2018   Pre-op evaluation 11/22/2018   Osteoarthritis of right hip 08/30/2018   Mitral regurgitation 02/26/2016   Overweight (BMI 25.0-29.9) 10/08/2014   S/P right TKA 10/06/2014   S/P knee replacement 10/06/2014   History of anesthesia reaction 08/20/2014   RBBB (right bundle branch block) 05/13/2014   Hyperlipidemia 11/04/2008   DRY EYE SYNDROME 11/04/2008   Intermittent asthma without complication 83/66/2947    Current Outpatient Medications on File Prior to Visit  Medication Sig Dispense Refill   diclofenac sodium (VOLTAREN) 1 % GEL Apply 1 application topically at bedtime as needed (knee pain).     Multiple Minerals-Vitamins (CALCIUM 600+D3 PLUS MINERALS) TABS Take 1 tablet by mouth in the morning.     Multiple Vitamin (MULTIVITAMIN WITH MINERALS) TABS tablet Take 1 tablet by mouth in the morning.     prednisoLONE acetate (PRED FORTE) 1 % ophthalmic suspension Place 1 drop into both eyes at bedtime.     psyllium (METAMUCIL SMOOTH TEXTURE) 28 % packet Take 1 packet by mouth in the morning.     Turmeric 450 MG CAPS Take 900 mg by mouth every evening.     No current facility-administered medications on file prior to  visit.    Past Medical History:  Diagnosis Date   Anemia    in her younger years (esp during pregnancy)   Arthritis 2004   neg ANA & RA   Asthma    minimal problems   Cancer ([place])    melanoma - upper arm    Cervical disc disease    Complication of anesthesia    reports had toruble waking after general anes for hysterectomy.  no troubles with anesthesia with the R TKR in 2016    Difficulty sleeping    due to knee pain   Heart murmur    History of skin cancer    Hyperlipidemia    Loose bowel movements    gassy, onset after hip surgery   MVP (mitral valve prolapse)    reports this is the cause of her murmur. denies any cardiac sx related to  MVP . does not have a cardiologist . reporst she gets her annual check ups of her heart at her pcp office    Pneumonia    in her 71's   Pre-diabetes    hgba1c 11-22-2018 5.8%, patient states that she feels more like her blood sugar drops than it being high , gets dizzy idf she doesnt eat - Pt denies being pre-diabetic    Past Surgical History:  Procedure Laterality Date   ABDOMINAL HYSTERECTOMY  AUGMENTATION MAMMAPLASTY     BREAST ENHANCEMENT SURGERY Bilateral    BREAST IMPLANT REMOVAL Bilateral 10/25/2020   Procedure: REMOVAL BREAST IMPLANTS;  Surgeon: Wallace Going, DO;  Location: [facility];  Service: Plastics;  Laterality: Bilateral;  90 min, please   CAPSULECTOMY Bilateral 10/25/2020   Procedure: CAPSULECTOMY;  Surgeon: Wallace Going, DO;  Location: [facility];  Service: Plastics;  Laterality: Bilateral;   CATARACT EXTRACTION     COLONOSCOPY     every 5 years ; Dr Earlean Shawl   cosmetic eye surgery     ELBOW SURGERY      X3   KNEE ARTHROSCOPY     rt knee   TOTAL HIP ARTHROPLASTY Right 12/10/2018   Procedure: TOTAL HIP ARTHROPLASTY ANTERIOR APPROACH;  Surgeon: Paralee Cancel, MD;  Location: [facility];  Service: Orthopedics;  Laterality: Right;  70 mins   TOTAL KNEE ARTHROPLASTY Right 10/06/2014   Procedure: RIGHT TOTAL KNEE  ARTHROPLASTY;  Surgeon: Paralee Cancel, MD;  Location: [facility];  Service: Orthopedics;  Laterality: Right;   TUBAL LIGATION      Social History   Socioeconomic History   Marital status: Divorced    Spouse name: Not on file   Number of children: Not on file   Years of education: Not on file   Highest education level: Not on file  Occupational History   Not on file  Tobacco Use   Smoking status: Never   Smokeless tobacco: Never  Substance and Sexual Activity   Alcohol use: Not Currently    Alcohol/week: 0.0 standard drinks    Comment:  wine socially;  none since fall 2019   Drug use: No   Sexual activity: Not on file  Other Topics Concern   Not on file  Social History Narrative   No regular exercise   Social Determinants of Health   Financial Resource Strain: Not on file  Food Insecurity: Not on file  Transportation Needs: Not on file  Physical Activity: Not on file  Stress: Not on file  Social Connections: Not on file    Family History  Problem Relation Age of Onset   Arthritis Mother    Colon cancer Mother    Stroke Father    Colon cancer Maternal Aunt    Colon cancer Maternal Grandmother    Colon polyps Brother     Review of Systems  Constitutional:  Negative for fever.  HENT:  Positive for hearing loss. Negative for congestion, ear pain, sinus pain and sore throat.   Neurological:  Negative for dizziness and headaches.      Objective:   Vitals:   12/13/20 1114  BP: 136/72  Pulse: 83  Temp: 98.1 F (36.7 C)  SpO2: 97%   BP Readings from Last 3 Encounters:  12/13/20 136/72  10/25/20 113/66  10/12/20 (!) 143/85   Wt Readings from Last 3 Encounters:  12/13/20 162 lb 6.4 oz (73.7 kg)  10/25/20 162 lb (73.5 kg)  10/12/20 162 lb (73.5 kg)   Body mass index is 27.88 kg/m.   Physical Exam     PRE-PROCEDURE EXAM: Left TM cannot be visualized due to total occlusion/impaction of the ear canal.  Right TM partially visualized due to partial occlusion  of ear canal by wax.   PROCEDURE INDICATION: remove wax from left ear canal to visualize ear drum & relieve hearing loss.    CONSENT:  Verbal  PROCEDURE NOTE:   LEFT EAR:  The CMA used a metal wax curette under direct  vision with an otoscope to free the wax bolus from the ear wall and then successfully removed a small bit of wax. The ear was then irrigated with warm water to remove the remaining wax. RIGHT EAR:  the CMA irrigated the ear canal to clear the excessive wax in the ear canal to provide relief of sensation in ear.    POST- PROCEDURE EXAM: TMs successfully visualized and found to have no erythema.  Hearing improved and returned to "normal".   98% of wax removed from both ear canals     Assessment & Plan:    See Problem List for Assessment and Plan of chronic medical problems.    This visit occurred during the SARS-CoV-2 public health emergency.  Safety protocols were in place, including screening questions prior to the visit, additional usage of staff PPE, and extensive cleaning of exam room while observing appropriate contact time as indicated for disinfecting solutions.

## 2020-12-13 ENCOUNTER — Ambulatory Visit (INDEPENDENT_AMBULATORY_CARE_PROVIDER_SITE_OTHER): Payer: Medicare Other | Admitting: Internal Medicine

## 2020-12-13 ENCOUNTER — Encounter: Payer: Self-pay | Admitting: Internal Medicine

## 2020-12-13 ENCOUNTER — Other Ambulatory Visit: Payer: Self-pay

## 2020-12-13 DIAGNOSIS — H6122 Impacted cerumen, left ear: Secondary | ICD-10-CM | POA: Diagnosis not present

## 2020-12-13 NOTE — Assessment & Plan Note (Signed)
Acute Decreased hearing secondary to impacted cerumen in left ear canal - removed successfully by CMA and hearing improved  Right ear was also lavaged for relief of something being in her ear Discussed she can use otc ear wax removal drops prn

## 2020-12-13 NOTE — Patient Instructions (Signed)
   Your ears were cleaned out today.    

## 2020-12-14 NOTE — Progress Notes (Signed)
Patient consent obtained. Irrigation with water and peroxide performed. Full view of both tympanic membranes after procedure.  Patient tolerated procedure well.

## 2021-01-01 DIAGNOSIS — R051 Acute cough: Secondary | ICD-10-CM | POA: Diagnosis not present

## 2021-01-01 DIAGNOSIS — R509 Fever, unspecified: Secondary | ICD-10-CM | POA: Diagnosis not present

## 2021-01-01 DIAGNOSIS — R519 Headache, unspecified: Secondary | ICD-10-CM | POA: Diagnosis not present

## 2021-01-01 DIAGNOSIS — M791 Myalgia, unspecified site: Secondary | ICD-10-CM | POA: Diagnosis not present

## 2021-02-09 DIAGNOSIS — M1712 Unilateral primary osteoarthritis, left knee: Secondary | ICD-10-CM | POA: Diagnosis not present

## 2021-02-16 DIAGNOSIS — M1712 Unilateral primary osteoarthritis, left knee: Secondary | ICD-10-CM | POA: Diagnosis not present

## 2021-02-23 DIAGNOSIS — M1712 Unilateral primary osteoarthritis, left knee: Secondary | ICD-10-CM | POA: Diagnosis not present

## 2021-05-04 DIAGNOSIS — D2271 Melanocytic nevi of right lower limb, including hip: Secondary | ICD-10-CM | POA: Diagnosis not present

## 2021-05-04 DIAGNOSIS — Z85828 Personal history of other malignant neoplasm of skin: Secondary | ICD-10-CM | POA: Diagnosis not present

## 2021-05-04 DIAGNOSIS — D1801 Hemangioma of skin and subcutaneous tissue: Secondary | ICD-10-CM | POA: Diagnosis not present

## 2021-05-04 DIAGNOSIS — D2262 Melanocytic nevi of left upper limb, including shoulder: Secondary | ICD-10-CM | POA: Diagnosis not present

## 2021-05-04 DIAGNOSIS — D2272 Melanocytic nevi of left lower limb, including hip: Secondary | ICD-10-CM | POA: Diagnosis not present

## 2021-05-04 DIAGNOSIS — D2261 Melanocytic nevi of right upper limb, including shoulder: Secondary | ICD-10-CM | POA: Diagnosis not present

## 2021-05-04 DIAGNOSIS — L570 Actinic keratosis: Secondary | ICD-10-CM | POA: Diagnosis not present

## 2021-05-04 DIAGNOSIS — L820 Inflamed seborrheic keratosis: Secondary | ICD-10-CM | POA: Diagnosis not present

## 2021-05-04 DIAGNOSIS — L821 Other seborrheic keratosis: Secondary | ICD-10-CM | POA: Diagnosis not present

## 2021-06-01 DIAGNOSIS — M1712 Unilateral primary osteoarthritis, left knee: Secondary | ICD-10-CM | POA: Diagnosis not present

## 2021-06-01 DIAGNOSIS — Z96651 Presence of right artificial knee joint: Secondary | ICD-10-CM | POA: Diagnosis not present

## 2021-06-21 DIAGNOSIS — H43813 Vitreous degeneration, bilateral: Secondary | ICD-10-CM | POA: Diagnosis not present

## 2021-06-21 DIAGNOSIS — H1789 Other corneal scars and opacities: Secondary | ICD-10-CM | POA: Diagnosis not present

## 2021-06-21 DIAGNOSIS — H26492 Other secondary cataract, left eye: Secondary | ICD-10-CM | POA: Diagnosis not present

## 2021-06-21 DIAGNOSIS — H35371 Puckering of macula, right eye: Secondary | ICD-10-CM | POA: Diagnosis not present

## 2021-07-24 ENCOUNTER — Encounter: Payer: Self-pay | Admitting: Internal Medicine

## 2021-07-24 NOTE — Patient Instructions (Addendum)
° ° °  Flu immunization administered today.     Blood work was ordered.     Medications changes include :   none    We will send over your paperwork and blood work to Dr Alvan Dame.

## 2021-07-24 NOTE — Progress Notes (Signed)
Subjective:    Patient ID: Isabella Valencia, female    DOB: 1941/06/17, 81 y.o.   MRN: [id number]   This visit occurred during the SARS-CoV-2 public health emergency.  Safety protocols were in place, including screening questions prior to the visit, additional usage of staff PPE, and extensive cleaning of exam room while observing appropriate contact time as indicated for disinfecting solutions.    HPI She is here for pre-operative clearance at the request of Dr Paralee Cancel for left total knee arthroplasty with spinal anesthesia scheduled for 09/27/21.   She has had issues with waking up after general anesthesia - for this surgery she will be getting spinal anesthesia so that will not be a problem.    She denies any family history of problems with anesthesia or personal or family history of bleeding/blood clot problems.    She has no concerns.  She is taking all her medication as prescribed - she is only on vitamins.   She is exercising regularly.  With her daily activities she denies chest pain, palpitations, SOB and lightheadedness.       Medications and allergies reviewed with patient and updated if appropriate.  Patient Active Problem List   Diagnosis Date Noted   S/P right THA, AA 12/10/2018   Hyperglycemia 11/22/2018   Preop examination 11/22/2018   Osteoarthritis of right hip 08/30/2018   Mitral regurgitation 02/26/2016   Overweight (BMI 25.0-29.9) 10/08/2014   S/P right TKA 10/06/2014   S/P knee replacement 10/06/2014   History of anesthesia reaction 08/20/2014   RBBB (right bundle branch block) 05/13/2014   Hyperlipidemia 11/04/2008   DRY EYE SYNDROME 11/04/2008   Intermittent asthma without complication 65/99/3570    Current Outpatient Medications on File Prior to Visit  Medication Sig Dispense Refill   diclofenac sodium (VOLTAREN) 1 % GEL Apply 1 application topically at bedtime as needed (knee pain).     Multiple Minerals-Vitamins (CALCIUM 600+D3 PLUS  MINERALS) TABS Take 1 tablet by mouth in the morning.     Multiple Vitamin (MULTIVITAMIN WITH MINERALS) TABS tablet Take 1 tablet by mouth in the morning.     prednisoLONE acetate (PRED FORTE) 1 % ophthalmic suspension Place 1 drop into both eyes at bedtime.     psyllium (METAMUCIL SMOOTH TEXTURE) 28 % packet Take 1 packet by mouth in the morning.     Turmeric 450 MG CAPS Take 900 mg by mouth every evening.     No current facility-administered medications on file prior to visit.    Past Medical History:  Diagnosis Date   Anemia    in her younger years (esp during pregnancy)   Arthritis 2004   neg ANA & RA   Asthma    minimal problems   Cancer ([place])    melanoma - upper arm    Cervical disc disease    Complication of anesthesia    reports had toruble waking after general anes for hysterectomy.  no troubles with anesthesia with the R TKR in 2016    Difficulty sleeping    due to knee pain   Heart murmur    History of skin cancer    Hyperlipidemia    Loose bowel movements    gassy, onset after hip surgery   MVP (mitral valve prolapse)    reports this is the cause of her murmur. denies any cardiac sx related to  MVP . does not have a cardiologist . reporst she gets her annual check ups of her heart  at her pcp office    Pneumonia    in her 33's   Pre-diabetes    hgba1c 11-22-2018 5.8%, patient states that she feels more like her blood sugar drops than it being high , gets dizzy idf she doesnt eat - Pt denies being pre-diabetic    Past Surgical History:  Procedure Laterality Date   ABDOMINAL HYSTERECTOMY     AUGMENTATION MAMMAPLASTY     BREAST ENHANCEMENT SURGERY Bilateral    BREAST IMPLANT REMOVAL Bilateral 10/25/2020   Procedure: REMOVAL BREAST IMPLANTS;  Surgeon: Wallace Going, DO;  Location: [facility];  Service: Plastics;  Laterality: Bilateral;  90 min, please   CAPSULECTOMY Bilateral 10/25/2020   Procedure: CAPSULECTOMY;  Surgeon: Wallace Going, DO;  Location: [facility];   Service: Plastics;  Laterality: Bilateral;   CATARACT EXTRACTION     COLONOSCOPY     every 5 years ; Dr Earlean Shawl   cosmetic eye surgery     ELBOW SURGERY      X3   KNEE ARTHROSCOPY     rt knee   TOTAL HIP ARTHROPLASTY Right 12/10/2018   Procedure: TOTAL HIP ARTHROPLASTY ANTERIOR APPROACH;  Surgeon: Paralee Cancel, MD;  Location: [facility];  Service: Orthopedics;  Laterality: Right;  70 mins   TOTAL KNEE ARTHROPLASTY Right 10/06/2014   Procedure: RIGHT TOTAL KNEE ARTHROPLASTY;  Surgeon: Paralee Cancel, MD;  Location: [facility];  Service: Orthopedics;  Laterality: Right;   TUBAL LIGATION      Social History   Socioeconomic History   Marital status: Divorced    Spouse name: Not on file   Number of children: Not on file   Years of education: Not on file   Highest education level: Not on file  Occupational History   Not on file  Tobacco Use   Smoking status: Never   Smokeless tobacco: Never  Substance and Sexual Activity   Alcohol use: Not Currently    Alcohol/week: 0.0 standard drinks    Comment:  wine socially;  none since fall 2019   Drug use: No   Sexual activity: Not on file  Other Topics Concern   Not on file  Social History Narrative   No regular exercise   Social Determinants of Health   Financial Resource Strain: Not on file  Food Insecurity: Not on file  Transportation Needs: Not on file  Physical Activity: Not on file  Stress: Not on file  Social Connections: Not on file    Family History  Problem Relation Age of Onset   Arthritis Mother    Colon cancer Mother    Stroke Father    Colon cancer Maternal Aunt    Colon cancer Maternal Grandmother    Colon polyps Brother     Review of Systems  Constitutional:  Negative for chills and fever.  Eyes:  Negative for visual disturbance.  Respiratory:  Negative for cough, shortness of breath and wheezing.   Cardiovascular:  Negative for chest pain, palpitations and leg swelling.  Gastrointestinal:  Negative for abdominal  pain, blood in stool, constipation, diarrhea and nausea.       Rare gerd  Genitourinary:  Negative for dysuria and hematuria.  Musculoskeletal:  Positive for arthralgias. Negative for back pain.  Skin:  Negative for rash.  Neurological:  Positive for dizziness (occ). Negative for light-headedness and headaches.  Psychiatric/Behavioral:  Negative for dysphoric mood. The patient is not nervous/anxious.       Objective:   Vitals:   07/25/21 1318  BP:  134/76  Pulse: 80  Temp: 98 F (36.7 C)  SpO2: 98%   Filed Weights   07/25/21 1318  Weight: 166 lb (75.3 kg)   Body mass index is 28.49 kg/m.  BP Readings from Last 3 Encounters:  07/25/21 134/76  12/13/20 136/72  10/25/20 113/66    Wt Readings from Last 3 Encounters:  07/25/21 166 lb (75.3 kg)  12/13/20 162 lb 6.4 oz (73.7 kg)  10/25/20 162 lb (73.5 kg)    Depression screen [facility] 2/9 07/25/2021 07/07/2020 11/22/2018 02/18/2016  Decreased Interest 0 0 0 0  Down, Depressed, Hopeless 0 0 0 0  PHQ - 2 Score 0 0 0 0  Altered sleeping 1 - - -  Tired, decreased energy 0 - - -  Change in appetite 0 - - -  Feeling bad or failure about yourself  0 - - -  Trouble concentrating 0 - - -  Moving slowly or fidgety/restless 0 - - -  Suicidal thoughts 0 - - -  PHQ-9 Score 1 - - -     GAD 7 : Generalized Anxiety Score 07/25/2021  Nervous, Anxious, on Edge 0  Control/stop worrying 0  Worry too much - different things 0  Trouble relaxing 0  Restless 0  Easily annoyed or irritable 0  Afraid - awful might happen 0  Total GAD 7 Score 0       Physical Exam Constitutional: She appears well-developed and well-nourished. No distress.  HENT:  Head: Normocephalic and atraumatic.  Right Ear: External ear normal. Normal ear canal and TM Left Ear: External ear normal.  Normal ear canal and TM Mouth/Throat: Oropharynx is clear and moist.  Eyes: Conjunctivae and EOM are normal.  Neck: Neck supple. No tracheal deviation present. No  thyromegaly present.  No carotid bruit  Cardiovascular: Normal rate, regular rhythm and normal heart sounds.   No murmur heard.  No edema. Pulmonary/Chest: Effort normal and breath sounds normal. No respiratory distress. She has no wheezes. She has no rales.  Abdominal: Soft. She exhibits no distension. There is no tenderness.  Lymphadenopathy: She has no cervical adenopathy.  Skin: Skin is warm and dry. She is not diaphoretic.  Psychiatric: She has a normal mood and affect. Her behavior is normal.     Lab Results  Component Value Date   WBC 5.6 10/25/2020   HGB 11.9 (L) 10/25/2020   HCT 36.5 10/25/2020   PLT 228 10/25/2020   GLUCOSE 89 10/25/2020   CHOL 207 (H) 02/28/2016   TRIG 77 02/28/2016   HDL 75 02/28/2016   LDLCALC 117 (H) 02/28/2016   ALT 6 04/22/2019   AST 17 04/22/2019   NA 139 10/25/2020   K 3.9 10/25/2020   CL 106 10/25/2020   CREATININE 0.96 10/25/2020   BUN 22 10/25/2020   CO2 25 10/25/2020   TSH 1.27 04/22/2019   INR 0.92 09/28/2014   HGBA1C 5.8 11/22/2018         Assessment & Plan:     Flu vaccine today    See Problem List for Assessment and Plan of chronic medical problems.

## 2021-07-24 NOTE — Assessment & Plan Note (Addendum)
Here for medical clearance for L TKR with spinal anesthesia on 3/28 - Dr Alvan Dame  Medically stable, without CAD or chronic respiratory disease., low risk  No concerning symptoms  EKG done within the past year-reviewed and no concerning findings.  No need to repeat  Will check CBC, CMP, PT/INR, A1c and UA-expect blood work urine to be normal  Cleared for surgery

## 2021-07-25 ENCOUNTER — Ambulatory Visit (INDEPENDENT_AMBULATORY_CARE_PROVIDER_SITE_OTHER): Payer: Medicare Other | Admitting: Internal Medicine

## 2021-07-25 ENCOUNTER — Other Ambulatory Visit: Payer: Self-pay

## 2021-07-25 VITALS — BP 134/76 | HR 80 | Temp 98.0°F | Ht 64.0 in | Wt 166.0 lb

## 2021-07-25 DIAGNOSIS — Z01818 Encounter for other preprocedural examination: Secondary | ICD-10-CM

## 2021-07-25 DIAGNOSIS — J4520 Mild intermittent asthma, uncomplicated: Secondary | ICD-10-CM | POA: Diagnosis not present

## 2021-07-25 DIAGNOSIS — R739 Hyperglycemia, unspecified: Secondary | ICD-10-CM

## 2021-07-25 DIAGNOSIS — Z23 Encounter for immunization: Secondary | ICD-10-CM

## 2021-07-25 DIAGNOSIS — E7849 Other hyperlipidemia: Secondary | ICD-10-CM | POA: Diagnosis not present

## 2021-07-25 LAB — URINALYSIS, ROUTINE W REFLEX MICROSCOPIC
Bilirubin Urine: NEGATIVE
Hgb urine dipstick: NEGATIVE
Ketones, ur: NEGATIVE
Leukocytes,Ua: NEGATIVE
Nitrite: NEGATIVE
RBC / HPF: NONE SEEN (ref 0–?)
Specific Gravity, Urine: 1.01 (ref 1.000–1.030)
Total Protein, Urine: NEGATIVE
Urine Glucose: NEGATIVE
Urobilinogen, UA: 0.2 (ref 0.0–1.0)
pH: 6.5 (ref 5.0–8.0)

## 2021-07-25 LAB — COMPREHENSIVE METABOLIC PANEL
ALT: 9 U/L (ref 0–35)
AST: 21 U/L (ref 0–37)
Albumin: 4.4 g/dL (ref 3.5–5.2)
Alkaline Phosphatase: 70 U/L (ref 39–117)
BUN: 16 mg/dL (ref 6–23)
CO2: 30 mEq/L (ref 19–32)
Calcium: 10 mg/dL (ref 8.4–10.5)
Chloride: 100 mEq/L (ref 96–112)
Creatinine, Ser: 0.91 mg/dL (ref 0.40–1.20)
GFR: 59.38 mL/min — ABNORMAL LOW (ref >60.00)
Glucose, Bld: 93 mg/dL (ref 70–99)
Potassium: 4.9 mEq/L (ref 3.5–5.1)
Sodium: 139 mEq/L (ref 135–145)
Total Bilirubin: 0.6 mg/dL (ref 0.2–1.2)
Total Protein: 7.6 g/dL (ref 6.0–8.3)

## 2021-07-25 LAB — CBC WITH DIFFERENTIAL/PLATELET
Basophils Absolute: 0.1 10*3/uL (ref 0.0–0.1)
Basophils Relative: 0.7 % (ref 0.0–3.0)
Eosinophils Absolute: 0.1 10*3/uL (ref 0.0–0.7)
Eosinophils Relative: 1.7 % (ref 0.0–5.0)
HCT: 39.6 % (ref 36.0–46.0)
Hemoglobin: 12.9 g/dL (ref 12.0–15.0)
Lymphocytes Relative: 18 % (ref 12.0–46.0)
Lymphs Abs: 1.5 10*3/uL (ref 0.7–4.0)
MCHC: 32.7 g/dL (ref 30.0–36.0)
MCV: 97.2 fl (ref 78.0–100.0)
Monocytes Absolute: 0.7 10*3/uL (ref 0.1–1.0)
Monocytes Relative: 8 % (ref 3.0–12.0)
Neutro Abs: 5.9 10*3/uL (ref 1.4–7.7)
Neutrophils Relative %: 71.6 % (ref 43.0–77.0)
Platelets: 266 10*3/uL (ref 150.0–400.0)
RBC: 4.07 Mil/uL (ref 3.87–5.11)
RDW: 13.7 % (ref 11.5–15.5)
WBC: 8.2 10*3/uL (ref 4.0–10.5)

## 2021-07-25 LAB — HEMOGLOBIN A1C: Hgb A1c MFr Bld: 5.6 % (ref 4.6–6.5)

## 2021-07-25 LAB — PROTIME-INR
INR: 0.9 ratio (ref 0.8–1.0)
Prothrombin Time: 10 s (ref 9.6–13.1)

## 2021-07-25 NOTE — Assessment & Plan Note (Signed)
Chronic Check a1c Low sugar / carb diet 

## 2021-07-25 NOTE — Assessment & Plan Note (Signed)
Chronic Diet controlled 

## 2021-07-25 NOTE — Assessment & Plan Note (Signed)
H/o asthma Has not needed an inhaler in a long time Denies cough, wheeze or SOB

## 2021-07-26 NOTE — Addendum Note (Signed)
Addended by: Marcina Millard on: 07/26/2021 09:10 AM   Modules accepted: Orders

## 2021-09-15 ENCOUNTER — Encounter (HOSPITAL_COMMUNITY): Payer: Self-pay

## 2021-09-15 NOTE — Patient Instructions (Addendum)
DUE TO COVID-19 ONLY ONE VISITOR  (aged 81 and older)  IS ALLOWED TO COME WITH YOU AND STAY IN THE WAITING ROOM ONLY DURING PRE OP AND PROCEDURE.   ? ?**NO VISITORS ARE ALLOWED IN THE SHORT STAY AREA OR RECOVERY ROOM!!** ? ?IF YOU WILL BE ADMITTED INTO THE HOSPITAL YOU ARE ALLOWED ONLY TWO SUPPORT PEOPLE DURING VISITATION HOURS ONLY (7 AM -8PM)   ?The support person(s) must pass our screening, gel in and out, and wear a mask at all times, including in the patient?s room. ?Patients must also wear a mask when staff or their support person are in the room. ?Visitors GUEST BADGE MUST BE WORN VISIBLY  ?One adult visitor may remain with you overnight and MUST be in the room by 8 P.M. ?  ? ? Your procedure is scheduled on: 09-27-21 ? ? Report to [facility] Main Entrance ? ?  Report to admitting at     [facility] AM ? ? Call this number if you have problems the morning of surgery [phone] ? ? Do not eat food :After Midnight. ? ? After Midnight you may have the following liquids until __0415 am ____ DAY OF SURGERY  Then nothing by mouth ? ?Water ?Black Coffee (sugar ok, NO MILK/CREAM OR CREAMERS)  ?Tea (sugar ok, NO MILK/CREAM OR CREAMERS) regular and decaf                             ?Plain Jell-O (NO RED)                                           ?Fruit ices (not with fruit pulp, NO RED)                                     ?Popsicles (NO RED)                                                                  ?Juice: apple, WHITE grape, WHITE cranberry ?Sports drinks like Gatorade (NO RED) ?Clear broth(vegetable,chicken,beef) ? ?         ?  ?  ?The day of surgery:  ?Drink ONE (1) Pre-Surgery Clear Ensure or G2 at      0400 AM the morning of surgery. Drink in one sitting. Do not sip.  ?This drink was given to you during your hospital  ?pre-op appointment visit. ? ?Nothing else to drink after completing the  ?Pre-Surgery Clear Ensure or G2. ?  ?       If you have questions, please contact your surgeon?s office. ? ?  ?   ?Oral Hygiene is also important to reduce your risk of infection.                                    ?Remember - BRUSH YOUR TEETH THE MORNING OF SURGERY WITH YOUR REGULAR TOOTHPASTE ? ? Do NOT smoke after Midnight ? ? Take these medicines  the morning of surgery with A SIP OF WATER: eye drops as needed ? ?DO NOT TAKE ANY ORAL DIABETIC MEDICATIONS DAY OF YOUR SURGERY ? ?                  ?           You may not have any metal on your body including hair pins, jewelry, and body piercing ? ?           Do not wear make-up, lotions, powders, perfumes/cologne, or deodorant ? ?Do not wear nail polish including gel and S&S, artificial/acrylic nails, or any other type of covering on natural nails including finger and toenails. If you have artificial nails, gel coating, etc. that needs to be removed by a nail salon please have this removed prior to surgery or surgery may need to be canceled/ delayed if the surgeon/ anesthesia feels like they are unable to be safely monitored.  ? ?Do not shave  48 hours prior to surgery.  ? ?           ? ? Do not bring valuables to the hospital. [facility] NOT ?            RESPONSIBLE   FOR VALUABLES. ? ? Contacts, dentures or bridgework may not be worn into surgery. ? ? Bring small overnight bag day of surgery. ?  ? ?            Please read over the following fact sheets you were given: IF [place] [phone] ? ?   [place] - Preparing for Surgery ?Before surgery, you can play an important role.  Because skin is not sterile, your skin needs to be as free of germs as possible.  You can reduce the number of germs on your skin by washing with CHG (chlorahexidine gluconate) soap before surgery.  CHG is an antiseptic cleaner which kills germs and bonds with the skin to continue killing germs even after washing. ?Please DO NOT use if you have an allergy to CHG or antibacterial soaps.  If your skin becomes reddened/irritated stop using  the CHG and inform your nurse when you arrive at Short Stay. ?Do not shave (including legs and underarms) for at least 48 hours prior to the first CHG shower.  You may shave your face/neck. ?Please follow these instructions carefully: ? 1.  Shower with CHG Soap the night before surgery and the  morning of Surgery. ? 2.  If you choose to wash your hair, wash your hair first as usual with your  normal  shampoo. ? 3.  After you shampoo, rinse your hair and body thoroughly to remove the  shampoo.                           4.  Use CHG as you would any other liquid soap.  You can apply chg directly  to the skin and wash  ?                     Gently with a scrungie or clean washcloth. ? 5.  Apply the CHG Soap to your body ONLY FROM THE NECK DOWN.   Do not use on face/ open      ?                     Wound or open sores.  Avoid contact with eyes, ears mouth and genitals (private parts).  ?                     Production manager,  Genitals (private parts) with your normal soap. ?            6.  Wash thoroughly, paying special attention to the area where your surgery  will be performed. ? 7.  Thoroughly rinse your body with warm water from the neck down. ? 8.  DO NOT shower/wash with your normal soap after using and rinsing off  the CHG Soap. ?               9.  Pat yourself dry with a clean towel. ?           10.  Wear clean pajamas. ?           11.  Place clean sheets on your bed the night of your first shower and do not  sleep with pets. ?Day of Surgery : ?Do not apply any lotions/deodorants the morning of surgery.  Please wear clean clothes to the hospital/surgery center. ? ?FAILURE TO FOLLOW THESE INSTRUCTIONS MAY RESULT IN THE CANCELLATION OF YOUR SURGERY ?PATIENT SIGNATURE_________________________________ ? ?NURSE SIGNATURE__________________________________ ? ?________________________________________________________________________  ? ?Incentive Spirometer ? ?An incentive spirometer is a tool that can help keep your lungs clear  and active. This tool measures how well you are filling your lungs with each breath. Taking long deep breaths may help reverse or decrease the chance of developing breathing (pulmonary) problems (especially infection) following: ?A long period of time when you are unable to move or be active. ?BEFORE THE PROCEDURE  ?If the spirometer includes an indicator to show your best effort, your nurse or respiratory therapist will set it to a desired goal. ?If possible, sit up straight or lean slightly forward. Try not to slouch. ?Hold the incentive spirometer in an upright position. ?INSTRUCTIONS FOR USE  ?Sit on the edge of your bed if possible, or sit up as far as you can in bed or on a chair. ?Hold the incentive spirometer in an upright position. ?Breathe out normally. ?Place the mouthpiece in your mouth and seal your lips tightly around it. ?Breathe in slowly and as deeply as possible, raising the piston or the ball toward the top of the column. ?Hold your breath for 3-5 seconds or for as long as possible. Allow the piston or ball to fall to the bottom of the column. ?Remove the mouthpiece from your mouth and breathe out normally. ?Rest for a few seconds and repeat Steps 1 through 7 at least 10 times every 1-2 hours when you are awake. Take your time and take a few normal breaths between deep breaths. ?The spirometer may include an indicator to show your best effort. Use the indicator as a goal to work toward during each repetition. ?After each set of 10 deep breaths, practice coughing to be sure your lungs are clear. If you have an incision (the cut made at the time of surgery), support your incision when coughing by placing a pillow or rolled up towels firmly against it. ?Once you are able to get out of bed, walk around indoors and cough well. You may stop using the incentive spirometer when instructed by your caregiver.  ?RISKS AND COMPLICATIONS ?Take your time so you do not get dizzy or light-headed. ?If you are in  pain, you may need to take or ask for pain medication  before doing incentive spirometry. It is harder to take a deep breath if you are having pain. ?AFTER USE ?Rest and breathe slowly and easily. ?It can

## 2021-09-15 NOTE — Progress Notes (Addendum)
PCP - Clearance 07-25-21 Stacy burns, MD on chart  W/ LOV note ?Cardiologist -  ? ?PPM/ICD -  ?Device Orders -  ?Rep Notified -  ? ?Chest x-ray -  ?EKG - 10-25-20 epic ?Stress Test - 2017 ?ECHO - 2017 ?Cardiac Cath -  ?HgbA1c 07-25-21 epic ? ?Sleep Study -  ?CPAP -  ? ?Fasting Blood Sugar -  ?Checks Blood Sugar _____ times a day ? ?Blood Thinner Instructions: ?Aspirin Instructions: ? ?ERAS Protcol - ?PRE-SURGERY Ensure or G2-  ? ? ?COVID vaccine - ? ?Activity-- ?Anesthesia review: Murmur ? ?Patient denies shortness of breath, fever, cough and chest pain at PAT appointment ? ? ?All instructions explained to the patient, with a verbal understanding of the material. Patient agrees to go over the instructions while at home for a better understanding. Patient also instructed to self quarantine after being tested for COVID-19. The opportunity to ask questions was provided. ?  ?

## 2021-09-16 ENCOUNTER — Encounter (HOSPITAL_COMMUNITY)
Admission: RE | Admit: 2021-09-16 | Discharge: 2021-09-16 | Disposition: A | Discharge status: Home / Self Care | Payer: Medicare Other | Source: Ambulatory Visit | Attending: Orthopedic Surgery | Admitting: Orthopedic Surgery

## 2021-09-16 ENCOUNTER — Encounter (HOSPITAL_COMMUNITY): Payer: Self-pay

## 2021-09-16 ENCOUNTER — Other Ambulatory Visit: Payer: Self-pay

## 2021-09-16 VITALS — BP 144/87 | HR 80 | Temp 97.6°F | Resp 16 | Ht 64.0 in | Wt 163.0 lb

## 2021-09-16 DIAGNOSIS — M1712 Unilateral primary osteoarthritis, left knee: Secondary | ICD-10-CM | POA: Insufficient documentation

## 2021-09-16 DIAGNOSIS — Z01812 Encounter for preprocedural laboratory examination: Secondary | ICD-10-CM | POA: Diagnosis not present

## 2021-09-16 LAB — CBC
HCT: 39.1 % (ref 36.0–46.0)
Hemoglobin: 12.7 g/dL (ref 12.0–15.0)
MCH: 32.2 pg (ref 26.0–34.0)
MCHC: 32.5 g/dL (ref 30.0–36.0)
MCV: 99 fL (ref 80.0–100.0)
Platelets: 284 10*3/uL (ref 150–400)
RBC: 3.95 MIL/uL (ref 3.87–5.11)
RDW: 12.8 % (ref 11.5–15.5)
WBC: 6.5 10*3/uL (ref 4.0–10.5)
nRBC: 0 % (ref 0.0–0.2)

## 2021-09-16 LAB — COMPREHENSIVE METABOLIC PANEL
ALT: 11 U/L (ref 0–44)
AST: 24 U/L (ref 15–41)
Albumin: 4.6 g/dL (ref 3.5–5.0)
Alkaline Phosphatase: 77 U/L (ref 38–126)
Anion gap: 6 (ref 5–15)
BUN: 22 mg/dL (ref 8–23)
CO2: 28 mmol/L (ref 22–32)
Calcium: 9.7 mg/dL (ref 8.9–10.3)
Chloride: 101 mmol/L (ref 98–111)
Creatinine, Ser: 0.82 mg/dL (ref 0.44–1.00)
GFR, Estimated: >60 mL/min (ref >60)
Glucose, Bld: 80 mg/dL (ref 70–99)
Potassium: 4.6 mmol/L (ref 3.5–5.1)
Sodium: 135 mmol/L (ref 135–145)
Total Bilirubin: 0.5 mg/dL (ref 0.3–1.2)
Total Protein: 7.9 g/dL (ref 6.5–8.1)

## 2021-09-16 LAB — SURGICAL PCR SCREEN
MRSA, PCR: NEGATIVE
Staphylococcus aureus: NEGATIVE

## 2021-09-16 LAB — TYPE AND SCREEN
ABO/RH(D): A NEG
Antibody Screen: NEGATIVE

## 2021-09-20 NOTE — Progress Notes (Signed)
CBC is a medically necessary test prior to undergoing joint replacement surgery. It is pertinent to know pre-operative hemoglobin for surgical risk assessment and planning. ? ?

## 2021-09-22 NOTE — H&P (Signed)
TOTAL KNEE ADMISSION H&P ? ?Patient is being admitted for left total knee arthroplasty. ? ?Subjective: ? ?Chief Complaint:left knee pain. ? ?HPI: Isabella Valencia, 81 y.o. female, has a history of pain and functional disability in the left knee due to arthritis and has failed non-surgical conservative treatments for greater than 12 weeks to includeNSAID's and/or analgesics and activity modification.  Onset of symptoms was gradual, starting 2 years ago with gradually worsening course since that time. The patient noted no past surgery on the left knee(s).  Patient currently rates pain in the left knee(s) at 7 out of 10 with activity. Patient has worsening of pain with activity and weight bearing, pain that interferes with activities of daily living, and pain with passive range of motion.  Patient has evidence of joint space narrowing by imaging studies.There is no active infection. ? ?Patient Active Problem List  ? Diagnosis Date Noted  ? S/P right THA, AA 12/10/2018  ? Hyperglycemia 11/22/2018  ? Preop examination 11/22/2018  ? Osteoarthritis of right hip 08/30/2018  ? Mitral regurgitation 02/26/2016  ? Overweight (BMI 25.0-29.9) 10/08/2014  ? S/P right TKA 10/06/2014  ? S/P knee replacement 10/06/2014  ? History of anesthesia reaction 08/20/2014  ? RBBB (right bundle branch block) 05/13/2014  ? Hyperlipidemia 11/04/2008  ? DRY EYE SYNDROME 11/04/2008  ? Intermittent asthma without complication 76/28/3151  ? ?Past Medical History:  ?Diagnosis Date  ? Anemia   ? in her younger years (esp during pregnancy)  ? Arthritis 2004  ? neg ANA & RA  ? Asthma   ? [facility])   ? melanoma - upper arm   ? Cervical disc disease   ? Complication of anesthesia   ? reports had toruble waking up  ? Difficulty sleeping   ? due to knee pain  ? Heart murmur   ? mild  ? History of skin cancer   ? Hyperlipidemia   ? Loose bowel movements   ? gassy, onset after hip surgery  ? MVP (mitral valve prolapse)   ? reports this is the cause of her  murmur. denies any cardiac sx related to  MVP . does not have a cardiologist . reporst she gets her annual check ups of her heart at her pcp office   ? Pneumonia   ? in her 27's  ?  ?Past Surgical History:  ?Procedure Laterality Date  ? ABDOMINAL HYSTERECTOMY    ? AUGMENTATION MAMMAPLASTY    ? BREAST ENHANCEMENT SURGERY Bilateral   ? BREAST IMPLANT REMOVAL Bilateral 10/25/2020  ? Procedure: REMOVAL BREAST IMPLANTS;  Surgeon: Wallace Going, DO;  Location: [place];  Service: Plastics;  Laterality: Bilateral;  90 min, please  ? CAPSULECTOMY Bilateral 10/25/2020  ? Procedure: CAPSULECTOMY;  Surgeon: Wallace Going, DO;  Location: [place];  Service: Plastics;  Laterality: Bilateral;  ? CATARACT EXTRACTION    ? COLONOSCOPY    ? every 5 years ; Dr Earlean Shawl  ? cosmetic eye surgery    ? ELBOW SURGERY    ?  X3  ? KNEE ARTHROSCOPY    ? rt knee  ? TOTAL HIP ARTHROPLASTY Right 12/10/2018  ? Procedure: TOTAL HIP ARTHROPLASTY ANTERIOR APPROACH;  Surgeon: Paralee Cancel, MD;  Location: [facility];  Service: Orthopedics;  Laterality: Right;  70 mins  ? TOTAL KNEE ARTHROPLASTY Right 10/06/2014  ? Procedure: RIGHT TOTAL KNEE ARTHROPLASTY;  Surgeon: Paralee Cancel, MD;  Location: [facility];  Service: Orthopedics;  Laterality: Right;  ? TUBAL LIGATION    ?  ?  No current facility-administered medications for this encounter.  ? ?Current Outpatient Medications  ?Medication Sig Dispense Refill Last Dose  ? diclofenac sodium (VOLTAREN) 1 % GEL Apply 1 application. topically daily as needed (knee pain).     ? hydroxypropyl methylcellulose / hypromellose (ISOPTO TEARS / GONIOVISC) 2.5 % ophthalmic solution Place 1 drop into both eyes 3 (three) times daily as needed for dry eyes.     ? Multiple Minerals-Vitamins (CALCIUM 600+D3 PLUS MINERALS) TABS Take 1 tablet by mouth in the morning.     ? Multiple Vitamin (MULTIVITAMIN WITH MINERALS) TABS tablet Take 1 tablet by mouth in the morning.     ? TURMERIC PO Take 550 mg by mouth daily.     ? psyllium  (METAMUCIL SMOOTH TEXTURE) 28 % packet Take 1 packet by mouth in the morning.     ? ?Allergies  ?Allergen Reactions  ? Triamcinolone Acetonide   ?  Soft tissue atrophy with injection of Kenalog  ? Morphine And Related Nausea And Vomiting  ? Adhesive [Tape] Rash  ?  Tears skin, Please use "paper" tape  ?  ?Social History  ? ?Tobacco Use  ? Smoking status: Never  ? Smokeless tobacco: Never  ?Substance Use Topics  ? Alcohol use: Not Currently  ?  Alcohol/week: 0.0 standard drinks  ?  Comment:  wine socially;  none since fall 2019  ?  ?Family History  ?Problem Relation Age of Onset  ? Arthritis Mother   ? Colon cancer Mother   ? Stroke Father   ? Colon cancer Maternal Aunt   ? Colon cancer Maternal Grandmother   ? Colon polyps Brother   ?  ? ?Review of Systems  ?Constitutional:  Negative for chills and fever.  ?Respiratory:  Negative for cough and shortness of breath.   ?Cardiovascular:  Negative for chest pain.  ?Gastrointestinal:  Negative for nausea and vomiting.  ?Musculoskeletal:  Positive for arthralgias.  ? ? ?Objective: ? ?Physical Exam ?Well nourished and well developed. ?General: Alert and oriented x3, cooperative and pleasant, no acute distress. ?Head: normocephalic, atraumatic, neck supple. ?Eyes: EOMI. ? ?Musculoskeletal: ? ?Left Hip Exam: No pain with ROM of the left hip. ? ?Left knee exam: ?No palpable effusion, warmth erythema ?Tenderness medially and anteriorly with a slight flexion contracture and flexion over 110 degrees with terminal tightness and discomfort ?No lower extremity edema or erythema ? ?Calves soft and nontender. Motor function intact in LE. Strength 5/5 LE bilaterally. ?Neuro: Distal pulses 2+. Sensation to light touch intact in LE. ? ?Vital signs in last 24 hours: ?  ? ?Labs: ? ? ?Estimated body mass index is 27.98 kg/m? as calculated from the following: ?  Height as of 09/16/21: '5\' 4"'$  (1.626 m). ?  Weight as of 09/16/21: 73.9 kg. ? ? ?Imaging Review ?Plain radiographs demonstrate severe  degenerative joint disease of the left knee(s). The overall alignment isneutral. The bone quality appears to be adequate for age and reported activity level. ? ? ? ? ? ?Assessment/Plan: ? ?End stage arthritis, left knee  ? ?The patient history, physical examination, clinical judgment of the provider and imaging studies are consistent with end stage degenerative joint disease of the left knee(s) and total knee arthroplasty is deemed medically necessary. The treatment options including medical management, injection therapy arthroscopy and arthroplasty were discussed at length. The risks and benefits of total knee arthroplasty were presented and reviewed. The risks due to aseptic loosening, infection, stiffness, patella tracking problems, thromboembolic complications and other imponderables were  discussed. The patient acknowledged the explanation, agreed to proceed with the plan and consent was signed. Patient is being admitted for inpatient treatment for surgery, pain control, PT, OT, prophylactic antibiotics, VTE prophylaxis, progressive ambulation and ADL's and discharge planning. The patient is planning to be discharged  home. ? ? ?Therapy Plans: outpatient therapy at [facility] ?Disposition: Home with daughter who lives in [place] & another daughter nearby ?Planned DVT Prophylaxis: aspirin '81mg'$  BID ?DME needed: none ?PCP: Dr. Billey Gosling, clearance received ?TXA: IV ?Allergies: triamcinolone - , ?Anesthesia Concerns: difficulty waking up ?BMI: 28.3 ?Last HgbA1c: Not diabetic ? ?Other: ?- Oxycodone, robaxin, tylenol, celebrex ? ?Patient's anticipated LOS is less than 2 midnights, meeting these requirements: ?- Younger than 66 ?- Lives within 1 hour of care ?- Has a competent adult at home to recover with post-op recover ?- NO history of ? - Chronic pain requiring opiods ? - Diabetes ? - Coronary Artery Disease ? - Heart failure ? - Heart attack ? - Stroke ? - DVT/VTE ? - Cardiac arrhythmia ? - Respiratory  Failure/COPD ? - Renal failure ? - Anemia ? - Advanced Liver disease ? ?Costella Hatcher, PA-C ?Orthopedic Surgery ?[facility] [place] ?[phone] ? ? ?

## 2021-09-23 ENCOUNTER — Encounter (HOSPITAL_COMMUNITY): Payer: Medicare Other

## 2021-09-26 NOTE — Anesthesia Preprocedure Evaluation (Addendum)
Anesthesia Evaluation  ?Patient identified by MRN, date of birth, ID band ?Patient awake ? ? ? ?Reviewed: ?Allergy & Precautions, NPO status , Patient's Chart, lab work & pertinent test results ? ?Airway ?Mallampati: II ? ?TM Distance: >3 FB ?Neck ROM: Full ? ? ? Dental ?no notable dental hx. ? ?  ?Pulmonary ?neg pulmonary ROS,  ?  ?Pulmonary exam normal ?breath sounds clear to auscultation ? ? ? ? ? ? Cardiovascular ?negative cardio ROS ?Normal cardiovascular exam ?Rhythm:Regular Rate:Normal ? ? ?  ?Neuro/Psych ?negative neurological ROS ? negative psych ROS  ? GI/Hepatic ?negative GI ROS, Neg liver ROS,   ?Endo/Other  ?negative endocrine ROS ? Renal/GU ?negative Renal ROS  ?negative genitourinary ?  ?Musculoskeletal ? ?(+) Arthritis , Osteoarthritis,   ? Abdominal ?  ?Peds ?negative pediatric ROS ?(+)  Hematology ?negative hematology ROS ?(+)   ?Anesthesia Other Findings ? ? Reproductive/Obstetrics ?negative OB ROS ? ?  ? ? ? ? ? ? ? ? ? ? ? ? ? ?  ?  ? ? ? ? ? ? ? ?Anesthesia Physical ?Anesthesia Plan ? ?ASA: 2 ? ?Anesthesia Plan: Spinal  ? ?Post-op Pain Management: Regional block*  ? ?Induction: Intravenous ? ?PONV Risk Score and Plan: 2 and Ondansetron, Propofol infusion and Treatment may vary due to age or medical condition ? ?Airway Management Planned: Simple Face Mask ? ?Additional Equipment:  ? ?Intra-op Plan:  ? ?Post-operative Plan:  ? ?Informed Consent: I have reviewed the patients History and Physical, chart, labs and discussed the procedure including the risks, benefits and alternatives for the proposed anesthesia with the patient or authorized representative who has indicated his/her understanding and acceptance.  ? ? ? ?Dental advisory given ? ?Plan Discussed with: CRNA and Surgeon ? ?Anesthesia Plan Comments:   ? ? ? ? ? ? ?Anesthesia Quick Evaluation ? ?

## 2021-09-27 ENCOUNTER — Encounter (HOSPITAL_COMMUNITY)
Admission: RE | Disposition: A | Discharge status: Home / Self Care | Payer: Self-pay | Source: Ambulatory Visit | Attending: Orthopedic Surgery

## 2021-09-27 ENCOUNTER — Other Ambulatory Visit: Payer: Self-pay

## 2021-09-27 ENCOUNTER — Ambulatory Visit (HOSPITAL_BASED_OUTPATIENT_CLINIC_OR_DEPARTMENT_OTHER): Payer: Medicare Other | Admitting: Anesthesiology

## 2021-09-27 ENCOUNTER — Encounter (HOSPITAL_COMMUNITY): Payer: Self-pay | Admitting: Orthopedic Surgery

## 2021-09-27 ENCOUNTER — Observation Stay (HOSPITAL_COMMUNITY)
Admission: RE | Admit: 2021-09-27 | Discharge: 2021-09-29 | Disposition: A | Discharge status: Home / Self Care | Payer: Medicare Other | Source: Ambulatory Visit | Attending: Orthopedic Surgery | Admitting: Orthopedic Surgery

## 2021-09-27 ENCOUNTER — Ambulatory Visit (HOSPITAL_COMMUNITY): Payer: Medicare Other | Admitting: Anesthesiology

## 2021-09-27 DIAGNOSIS — Z96651 Presence of right artificial knee joint: Secondary | ICD-10-CM | POA: Diagnosis not present

## 2021-09-27 DIAGNOSIS — M25762 Osteophyte, left knee: Secondary | ICD-10-CM | POA: Insufficient documentation

## 2021-09-27 DIAGNOSIS — M25462 Effusion, left knee: Secondary | ICD-10-CM | POA: Insufficient documentation

## 2021-09-27 DIAGNOSIS — Z96641 Presence of right artificial hip joint: Secondary | ICD-10-CM | POA: Diagnosis not present

## 2021-09-27 DIAGNOSIS — M1712 Unilateral primary osteoarthritis, left knee: Secondary | ICD-10-CM

## 2021-09-27 DIAGNOSIS — G8918 Other acute postprocedural pain: Secondary | ICD-10-CM | POA: Diagnosis not present

## 2021-09-27 DIAGNOSIS — Z85828 Personal history of other malignant neoplasm of skin: Secondary | ICD-10-CM | POA: Diagnosis not present

## 2021-09-27 DIAGNOSIS — Z20822 Contact with and (suspected) exposure to covid-19: Secondary | ICD-10-CM | POA: Diagnosis not present

## 2021-09-27 DIAGNOSIS — M659 Synovitis and tenosynovitis, unspecified: Secondary | ICD-10-CM | POA: Insufficient documentation

## 2021-09-27 DIAGNOSIS — Z79899 Other long term (current) drug therapy: Secondary | ICD-10-CM | POA: Diagnosis not present

## 2021-09-27 DIAGNOSIS — J45909 Unspecified asthma, uncomplicated: Secondary | ICD-10-CM | POA: Insufficient documentation

## 2021-09-27 DIAGNOSIS — Z96652 Presence of left artificial knee joint: Secondary | ICD-10-CM

## 2021-09-27 HISTORY — PX: TOTAL KNEE ARTHROPLASTY: SHX125

## 2021-09-27 LAB — SARS CORONAVIRUS 2 BY RT PCR (HOSPITAL ORDER, PERFORMED IN ~~LOC~~ HOSPITAL LAB): SARS Coronavirus 2: NEGATIVE

## 2021-09-27 SURGERY — ARTHROPLASTY, KNEE, TOTAL
Anesthesia: Spinal | Site: Knee | Laterality: Left

## 2021-09-27 MED ORDER — SODIUM CHLORIDE (PF) 0.9 % IJ SOLN
INTRAMUSCULAR | Status: DC | PRN
  Administered 2021-09-27: 30 mL

## 2021-09-27 MED ORDER — LACTATED RINGERS IV SOLN: INTRAVENOUS | Status: DC

## 2021-09-27 MED ORDER — CHLORHEXIDINE GLUCONATE 0.12 % MT SOLN: 15.0000 mL | Freq: Once | OROMUCOSAL | Status: AC

## 2021-09-27 MED ORDER — PHENYLEPHRINE HCL (PRESSORS) 10 MG/ML IV SOLN
INTRAVENOUS | Status: DC | PRN
  Administered 2021-09-27 (×4): 80 ug via INTRAVENOUS

## 2021-09-27 MED ORDER — ROPIVACAINE HCL 5 MG/ML IJ SOLN
INTRAMUSCULAR | Status: DC | PRN
  Administered 2021-09-27: 20 mL via PERINEURAL

## 2021-09-27 MED ORDER — TRANEXAMIC ACID-NACL 1000-0.7 MG/100ML-% IV SOLN
1000.0000 mg | Freq: Once | INTRAVENOUS | Status: AC
  Administered 2021-09-27: 1000 mg via INTRAVENOUS
  Filled 2021-09-27: qty 100

## 2021-09-27 MED ORDER — BUPIVACAINE-EPINEPHRINE (PF) 0.25% -1:200000 IJ SOLN
INTRAMUSCULAR | Status: AC
  Filled 2021-09-27: qty 30

## 2021-09-27 MED ORDER — ORAL CARE MOUTH RINSE
15.0000 mL | Freq: Once | OROMUCOSAL | Status: AC
  Administered 2021-09-27: 15 mL via OROMUCOSAL

## 2021-09-27 MED ORDER — OXYCODONE HCL 5 MG PO TABS
10.0000 mg | ORAL_TABLET | ORAL | Status: DC | PRN
  Administered 2021-09-29: 10 mg via ORAL
  Filled 2021-09-27: qty 2

## 2021-09-27 MED ORDER — SODIUM CHLORIDE 0.9 % IR SOLN
Status: DC | PRN
  Administered 2021-09-27: 1000 mL

## 2021-09-27 MED ORDER — METHOCARBAMOL 500 MG PO TABS
500.0000 mg | ORAL_TABLET | Freq: Four times a day (QID) | ORAL | Status: DC | PRN
  Administered 2021-09-27 – 2021-09-29 (×3): 500 mg via ORAL
  Filled 2021-09-27 (×3): qty 1

## 2021-09-27 MED ORDER — DEXAMETHASONE SODIUM PHOSPHATE 10 MG/ML IJ SOLN
10.0000 mg | Freq: Once | INTRAMUSCULAR | Status: AC
  Administered 2021-09-28: 10 mg via INTRAVENOUS
  Filled 2021-09-27: qty 1

## 2021-09-27 MED ORDER — ASPIRIN 81 MG PO CHEW
81.0000 mg | CHEWABLE_TABLET | Freq: Two times a day (BID) | ORAL | Status: DC
  Administered 2021-09-27 – 2021-09-29 (×4): 81 mg via ORAL
  Filled 2021-09-27 (×4): qty 1

## 2021-09-27 MED ORDER — BUPIVACAINE-EPINEPHRINE (PF) 0.25% -1:200000 IJ SOLN
INTRAMUSCULAR | Status: DC | PRN
  Administered 2021-09-27: 30 mL

## 2021-09-27 MED ORDER — FENTANYL CITRATE (PF) 100 MCG/2ML IJ SOLN
INTRAMUSCULAR | Status: AC
  Filled 2021-09-27: qty 2

## 2021-09-27 MED ORDER — ACETAMINOPHEN 325 MG PO TABS
325.0000 mg | ORAL_TABLET | Freq: Four times a day (QID) | ORAL | Status: DC | PRN
  Administered 2021-09-27: 650 mg via ORAL
  Filled 2021-09-27: qty 2

## 2021-09-27 MED ORDER — OXYCODONE HCL 5 MG PO TABS
5.0000 mg | ORAL_TABLET | ORAL | Status: DC | PRN
  Administered 2021-09-27: 5 mg via ORAL
  Administered 2021-09-27 – 2021-09-29 (×6): 10 mg via ORAL
  Filled 2021-09-27 (×5): qty 2
  Filled 2021-09-27: qty 1
  Filled 2021-09-27: qty 2

## 2021-09-27 MED ORDER — DOCUSATE SODIUM 100 MG PO CAPS
100.0000 mg | ORAL_CAPSULE | Freq: Two times a day (BID) | ORAL | Status: DC
  Administered 2021-09-27 – 2021-09-29 (×5): 100 mg via ORAL
  Filled 2021-09-27 (×5): qty 1

## 2021-09-27 MED ORDER — BUPIVACAINE IN DEXTROSE 0.75-8.25 % IT SOLN
INTRATHECAL | Status: DC | PRN
  Administered 2021-09-27: 1.4 mL via INTRATHECAL

## 2021-09-27 MED ORDER — ONDANSETRON HCL 4 MG/2ML IJ SOLN: 4.0000 mg | Freq: Once | INTRAMUSCULAR | Status: DC | PRN

## 2021-09-27 MED ORDER — SODIUM CHLORIDE (PF) 0.9 % IJ SOLN
INTRAMUSCULAR | Status: AC
  Filled 2021-09-27: qty 30

## 2021-09-27 MED ORDER — ACETAMINOPHEN 10 MG/ML IV SOLN: 1000.0000 mg | Freq: Once | INTRAVENOUS | Status: DC | PRN

## 2021-09-27 MED ORDER — KETOROLAC TROMETHAMINE 30 MG/ML IJ SOLN
INTRAMUSCULAR | Status: AC
  Filled 2021-09-27: qty 1

## 2021-09-27 MED ORDER — FENTANYL CITRATE (PF) 100 MCG/2ML IJ SOLN
INTRAMUSCULAR | Status: DC | PRN
  Administered 2021-09-27: 100 ug via INTRAVENOUS

## 2021-09-27 MED ORDER — POLYETHYLENE GLYCOL 3350 17 G PO PACK: 17.0000 g | PACK | Freq: Every day | ORAL | Status: DC | PRN

## 2021-09-27 MED ORDER — ONDANSETRON HCL 4 MG PO TABS
4.0000 mg | ORAL_TABLET | Freq: Four times a day (QID) | ORAL | Status: DC | PRN
  Administered 2021-09-28 – 2021-09-29 (×2): 4 mg via ORAL
  Filled 2021-09-27 (×3): qty 1

## 2021-09-27 MED ORDER — METHOCARBAMOL 500 MG IVPB - SIMPLE MED
INTRAVENOUS | Status: AC
  Filled 2021-09-27: qty 50

## 2021-09-27 MED ORDER — MENTHOL 3 MG MT LOZG: 1.0000 | LOZENGE | OROMUCOSAL | Status: DC | PRN

## 2021-09-27 MED ORDER — ONDANSETRON HCL 4 MG/2ML IJ SOLN
4.0000 mg | Freq: Four times a day (QID) | INTRAMUSCULAR | Status: DC | PRN
  Administered 2021-09-27: 4 mg via INTRAVENOUS
  Filled 2021-09-27: qty 2

## 2021-09-27 MED ORDER — BISACODYL 10 MG RE SUPP: 10.0000 mg | Freq: Every day | RECTAL | Status: DC | PRN

## 2021-09-27 MED ORDER — DEXAMETHASONE SODIUM PHOSPHATE 10 MG/ML IJ SOLN
8.0000 mg | Freq: Once | INTRAMUSCULAR | Status: AC
  Administered 2021-09-27: 8 mg via INTRAVENOUS

## 2021-09-27 MED ORDER — PROPOFOL 500 MG/50ML IV EMUL
INTRAVENOUS | Status: DC | PRN
  Administered 2021-09-27: 75 ug/kg/min via INTRAVENOUS

## 2021-09-27 MED ORDER — 0.9 % SODIUM CHLORIDE (POUR BTL) OPTIME
TOPICAL | Status: DC | PRN
  Administered 2021-09-27: 1000 mL

## 2021-09-27 MED ORDER — SODIUM CHLORIDE 0.9 % IV SOLN: INTRAVENOUS | Status: DC

## 2021-09-27 MED ORDER — FERROUS SULFATE 325 (65 FE) MG PO TABS
325.0000 mg | ORAL_TABLET | Freq: Three times a day (TID) | ORAL | Status: DC
  Administered 2021-09-27 – 2021-09-29 (×5): 325 mg via ORAL
  Filled 2021-09-27 (×6): qty 1

## 2021-09-27 MED ORDER — CELECOXIB 200 MG PO CAPS
200.0000 mg | ORAL_CAPSULE | Freq: Two times a day (BID) | ORAL | Status: DC
  Administered 2021-09-27 – 2021-09-29 (×5): 200 mg via ORAL
  Filled 2021-09-27 (×5): qty 1

## 2021-09-27 MED ORDER — CEFAZOLIN SODIUM-DEXTROSE 2-4 GM/100ML-% IV SOLN
2.0000 g | INTRAVENOUS | Status: AC
  Administered 2021-09-27: 2 g via INTRAVENOUS
  Filled 2021-09-27: qty 100

## 2021-09-27 MED ORDER — CEFAZOLIN SODIUM-DEXTROSE 2-4 GM/100ML-% IV SOLN
2.0000 g | Freq: Four times a day (QID) | INTRAVENOUS | Status: AC
  Administered 2021-09-27 (×2): 2 g via INTRAVENOUS
  Filled 2021-09-27 (×2): qty 100

## 2021-09-27 MED ORDER — POVIDONE-IODINE 10 % EX SWAB
2.0000 "application " | Freq: Once | CUTANEOUS | Status: AC
  Administered 2021-09-27: 2 via TOPICAL

## 2021-09-27 MED ORDER — HYDROMORPHONE HCL 1 MG/ML IJ SOLN
0.5000 mg | INTRAMUSCULAR | Status: DC | PRN
  Administered 2021-09-27: 1 mg via INTRAVENOUS
  Filled 2021-09-27: qty 1

## 2021-09-27 MED ORDER — POLYVINYL ALCOHOL 1.4 % OP SOLN: 1.0000 [drp] | Freq: Three times a day (TID) | OPHTHALMIC | Status: DC | PRN

## 2021-09-27 MED ORDER — STERILE WATER FOR IRRIGATION IR SOLN
Status: DC | PRN
  Administered 2021-09-27: 2000 mL

## 2021-09-27 MED ORDER — HYDROMORPHONE HCL 1 MG/ML IJ SOLN
INTRAMUSCULAR | Status: AC
  Filled 2021-09-27: qty 1

## 2021-09-27 MED ORDER — METHOCARBAMOL 500 MG IVPB - SIMPLE MED
500.0000 mg | Freq: Four times a day (QID) | INTRAVENOUS | Status: DC | PRN
  Administered 2021-09-27: 500 mg via INTRAVENOUS
  Filled 2021-09-27: qty 50

## 2021-09-27 MED ORDER — DIPHENHYDRAMINE HCL 12.5 MG/5ML PO ELIX: 12.5000 mg | ORAL_SOLUTION | ORAL | Status: DC | PRN

## 2021-09-27 MED ORDER — METOCLOPRAMIDE HCL 5 MG PO TABS: 5.0000 mg | ORAL_TABLET | Freq: Three times a day (TID) | ORAL | Status: DC | PRN

## 2021-09-27 MED ORDER — METOCLOPRAMIDE HCL 5 MG/ML IJ SOLN
5.0000 mg | Freq: Three times a day (TID) | INTRAMUSCULAR | Status: DC | PRN
  Administered 2021-09-27: 10 mg via INTRAVENOUS
  Filled 2021-09-27: qty 2

## 2021-09-27 MED ORDER — KETOROLAC TROMETHAMINE 30 MG/ML IJ SOLN
INTRAMUSCULAR | Status: DC | PRN
  Administered 2021-09-27: 30 mg

## 2021-09-27 MED ORDER — HYDROMORPHONE HCL 1 MG/ML IJ SOLN
0.2500 mg | INTRAMUSCULAR | Status: DC | PRN
  Administered 2021-09-27 (×3): 0.5 mg via INTRAVENOUS

## 2021-09-27 MED ORDER — PHENOL 1.4 % MT LIQD: 1.0000 | OROMUCOSAL | Status: DC | PRN

## 2021-09-27 MED ORDER — ONDANSETRON HCL 4 MG/2ML IJ SOLN
INTRAMUSCULAR | Status: DC | PRN
  Administered 2021-09-27: 4 mg via INTRAVENOUS

## 2021-09-27 MED ORDER — TRANEXAMIC ACID-NACL 1000-0.7 MG/100ML-% IV SOLN
1000.0000 mg | INTRAVENOUS | Status: AC
  Administered 2021-09-27: 1000 mg via INTRAVENOUS
  Filled 2021-09-27: qty 100

## 2021-09-27 SURGICAL SUPPLY — 54 items
ADH SKN CLS APL DERMABOND .7 (GAUZE/BANDAGES/DRESSINGS) ×1
ATTUNE MED ANAT PAT 32 KNEE (Knees) ×1 IMPLANT
ATTUNE PSFEM LTSZ5 NARCEM KNEE (Femur) ×1 IMPLANT
ATTUNE PSRP INSE SZ5 7 KNEE (Insert) ×1 IMPLANT
BAG COUNTER SPONGE SURGICOUNT (BAG) IMPLANT
BAG SPEC THK2 15X12 ZIP CLS (MISCELLANEOUS)
BAG SPNG CNTER NS LX DISP (BAG)
BAG ZIPLOCK 12X15 (MISCELLANEOUS) IMPLANT
BASEPLATE TIBIAL ROTATING SZ 4 (Knees) ×1 IMPLANT
BLADE SAW SGTL 11.0X1.19X90.0M (BLADE) IMPLANT
BLADE SAW SGTL 13.0X1.19X90.0M (BLADE) ×2 IMPLANT
BLADE SURG SZ10 CARB STEEL (BLADE) ×4 IMPLANT
BNDG ELASTIC 6X5.8 VLCR STR LF (GAUZE/BANDAGES/DRESSINGS) ×2 IMPLANT
BOWL SMART MIX CTS (DISPOSABLE) ×2 IMPLANT
BSPLAT TIB 4 CMNT ROT PLAT STR (Knees) ×1 IMPLANT
CEMENT HV SMART SET (Cement) ×2 IMPLANT
CUFF TOURN SGL QUICK 34 (TOURNIQUET CUFF) ×2
CUFF TRNQT CYL 34X4.125X (TOURNIQUET CUFF) ×1 IMPLANT
DERMABOND ADVANCED (GAUZE/BANDAGES/DRESSINGS) ×1
DERMABOND ADVANCED .7 DNX12 (GAUZE/BANDAGES/DRESSINGS) ×1 IMPLANT
DRAPE INCISE IOBAN 66X45 STRL (DRAPES) ×2 IMPLANT
DRAPE U-SHAPE 47X51 STRL (DRAPES) ×2 IMPLANT
DRESSING AQUACEL AG SP 3.5X10 (GAUZE/BANDAGES/DRESSINGS) ×1 IMPLANT
DRSG AQUACEL AG SP 3.5X10 (GAUZE/BANDAGES/DRESSINGS) ×2
DURAPREP 26ML APPLICATOR (WOUND CARE) ×4 IMPLANT
ELECT REM PT RETURN 15FT ADLT (MISCELLANEOUS) ×2 IMPLANT
GLOVE SURG ENC MOIS LTX SZ6 (GLOVE) ×2 IMPLANT
GLOVE SURG ENC MOIS LTX SZ7 (GLOVE) ×2 IMPLANT
GLOVE SURG UNDER LTX SZ6.5 (GLOVE) ×2 IMPLANT
GLOVE SURG UNDER POLY LF SZ7.5 (GLOVE) ×2 IMPLANT
GOWN STRL REUS W/ TWL LRG LVL3 (GOWN DISPOSABLE) ×1 IMPLANT
GOWN STRL REUS W/TWL LRG LVL3 (GOWN DISPOSABLE) ×2
HANDPIECE INTERPULSE COAX TIP (DISPOSABLE) ×2
HOLDER FOLEY CATH W/STRAP (MISCELLANEOUS) IMPLANT
KIT TURNOVER KIT A (KITS) IMPLANT
MANIFOLD NEPTUNE II (INSTRUMENTS) ×2 IMPLANT
NDL SAFETY ECLIPSE 18X1.5 (NEEDLE) IMPLANT
NEEDLE HYPO 18GX1.5 SHARP (NEEDLE)
NS IRRIG 1000ML POUR BTL (IV SOLUTION) ×2 IMPLANT
PACK TOTAL KNEE CUSTOM (KITS) ×2 IMPLANT
PROTECTOR NERVE ULNAR (MISCELLANEOUS) ×2 IMPLANT
SET HNDPC FAN SPRY TIP SCT (DISPOSABLE) ×1 IMPLANT
SET PAD KNEE POSITIONER (MISCELLANEOUS) ×2 IMPLANT
SPIKE FLUID TRANSFER (MISCELLANEOUS) ×4 IMPLANT
SUT MNCRL AB 4-0 PS2 18 (SUTURE) ×2 IMPLANT
SUT STRATAFIX PDS+ 0 24IN (SUTURE) ×2 IMPLANT
SUT VIC AB 1 CT1 36 (SUTURE) ×2 IMPLANT
SUT VIC AB 2-0 CT1 27 (SUTURE) ×4
SUT VIC AB 2-0 CT1 TAPERPNT 27 (SUTURE) ×2 IMPLANT
SYR 3ML LL SCALE MARK (SYRINGE) ×2 IMPLANT
TRAY FOLEY MTR SLVR 16FR STAT (SET/KITS/TRAYS/PACK) ×2 IMPLANT
TUBE SUCTION HIGH CAP CLEAR NV (SUCTIONS) ×2 IMPLANT
WATER STERILE IRR 1000ML POUR (IV SOLUTION) ×4 IMPLANT
WRAP KNEE MAXI GEL POST OP (GAUZE/BANDAGES/DRESSINGS) ×2 IMPLANT

## 2021-09-27 NOTE — Care Plan (Signed)
Ortho Bundle Case Management Note ? ?Patient Details  ?Name: Isabella Valencia ?MRN: [id number] ?Date of Birth: 04-25-1941 ? ?L TKA on 09-27-21 ?DCP:  Home with dtr ?DME:  No needs, has a RW ?PT:  EO on 09-30-21 ?                ? ? ? ?DME Arranged:  N/A ?DME Agency:  NA ? ?HH Arranged:  NA ?[place] Agency:  NA ? ?Additional Comments: ?Please contact me with any questions of if this plan should need to change. ? ?Larwance Rote  [phone] ?09/27/2021, 11:14 AM ?  ?

## 2021-09-27 NOTE — Anesthesia Procedure Notes (Signed)
Anesthesia Regional Block: Adductor canal block  ? ?Pre-Anesthetic Checklist: , timeout performed,  Correct Patient, Correct Site, Correct Laterality,  Correct Procedure, Correct Position, site marked,  Risks and benefits discussed,  Surgical consent,  Pre-op evaluation,  At surgeon's request and post-op pain management ? ?Laterality: Left ? ?Prep: chloraprep     ?  ?Needles:  ?Injection technique: Single-shot ? ?Needle Type: Echogenic Needle   ? ? ?Needle Length: 9cm  ? ? ? ? ?Additional Needles: ? ? ?Procedures:,,,, ultrasound used (permanent image in chart),,    ?Narrative:  ?Start time: 09/27/2021 6:55 AM ?End time: 09/27/2021 7:01 AM ?Injection made incrementally with aspirations every 5 mL. ? ?Performed by: Personally  ?Anesthesiologist: Myrtie Soman, MD ? ?Additional Notes: ?Patient tolerated the procedure well without complications ? ? ? ?

## 2021-09-27 NOTE — Op Note (Signed)
NAME:  Isabella Valencia                  ?   ? MEDICAL RECORD NO.:  [id number]  ?   ?                        FACILITY:  [facility]  ?   ? PHYSICIAN:  Pietro Cassis. Alvan Dame, M.D.  DATE OF BIRTH:  07-11-1940  ?   ? DATE OF PROCEDURE:  09/27/2021   ?  ?   ?                             OPERATIVE REPORT  ?   ?   ? PREOPERATIVE DIAGNOSIS:  Left knee osteoarthritis.  ?   ? POSTOPERATIVE DIAGNOSIS:  Left knee osteoarthritis.  ?   ? FINDINGS:  The patient was noted to have complete loss of cartilage and  ? bone-on-bone arthritis with associated osteophytes in the medial and patellofemoral compartments of  ? the knee with a significant synovitis and associated effusion.  The patient had failed months of conservative treatment including medications, injection therapy, activity modification. ?   ? PROCEDURE:  Left total knee replacement.  ?   ? COMPONENTS USED:  DePuy Attune rotating platform posterior stabilized knee  ? system, a size 5N femur, 4 tibia, size 7 mm PS AOX insert, and 32 anatomic patellar  ? button.  ?   ? SURGEON:  Pietro Cassis. Alvan Dame, M.D.  ?   ? ASSISTANT:  Costella Hatcher, PA-C.  ?   ? ANESTHESIA:  Regional and Spinal.  ?   ? SPECIMENS:  None.  ?   ? COMPLICATION:  None.  ?   ? DRAINS:  None. ? ?EBL: <100 cc  ?   ? TOURNIQUET TIME:   ?Total Tourniquet Time Documented: ?Thigh (Left) - 26 minutes ?Total: Thigh (Left) - 26 minutes ? .  ?   ? The patient was stable to the recovery room.  ?   ? INDICATION FOR PROCEDURE:  Isabella Valencia is a 81 y.o. female patient of  ? mine.  The patient had been seen, evaluated, and treated for months conservatively in the  ? office with medication, activity modification, and injections.  The patient had  ? radiographic changes of bone-on-bone arthritis with endplate sclerosis and osteophytes noted.  Based on the radiographic changes and failed conservative measures, the patient  ? decided to proceed with definitive treatment, total knee replacement.  Risks of infection, DVT, component failure, need  for revision surgery, neurovascular injury were reviewed in the office setting.  The postop course was reviewed stressing the efforts to maximize post-operative satisfaction and function.  Consent was obtained for benefit of pain  ? relief.  ?   ? PROCEDURE IN DETAIL:  The patient was brought to the operative theater.  ? Once adequate anesthesia, preoperative antibiotics, 2 gm of Ancef,1 gm of Tranexamic Acid, and 10 mg of Decadron administered, the patient was positioned supine with a left thigh tourniquet placed.  The  ?left lower extremity was prepped and draped in sterile fashion.  A time-  ? out was performed identifying the patient, planned procedure, and the appropriate extremity.  ?   ? The left lower extremity was placed in the [facility] leg holder.  The leg was  ? exsanguinated, tourniquet elevated to 250 mmHg.  A midline incision was  ?  made followed by median parapatellar arthrotomy.  Following initial  ? exposure, attention was first directed to the patella.  Precut  ? measurement was noted to be 18 mm.  I resected down to 13 mm and used a  ? 32 anatomic patellar button to restore patellar height as well as cover the cut surface.  ?   ? The lug holes were drilled and a metal shim was placed to protect the  ? patella from retractors and saw blade during the procedure.  ?   ? At this point, attention was now directed to the femur.  The femoral  ? canal was opened with a drill, irrigated to try to prevent fat emboli.  An  ? intramedullary rod was passed at 3 degrees valgus, 9 mm of bone was  ? resected off the distal femur.  Following this resection, the tibia was  ? subluxated anteriorly.  Using the extramedullary guide, 2 mm of bone was resected off  ? the proximal media tibia.  We confirmed the gap would be  ? stable medially and laterally with a size 5 spacer block as well as confirmed that the tibial cut was perpendicular in the coronal plane, checking with an alignment rod.  ?   ? Once this was done, I  sized the femur to be a size 5 in the anterior-  ? posterior dimension, chose a narrow component based on medial and  ? lateral dimension.  The size 5 rotation block was then pinned in  ? position anterior referenced using the C-clamp to set rotation.  The  ? anterior, posterior, and  chamfer cuts were made without difficulty nor  ? notching making certain that I was along the anterior cortex to help  ? with flexion gap stability.  ?   ? The final box cut was made off the lateral aspect of distal femur.  ?   ? At this point, the tibia was sized to be a size 4.  The size 4 tray was  ? then pinned in position through the medial third of the tubercle,  ? drilled, and keel punched.  Trial reduction was now carried with a 5 femur, ? 4 tibia, a size 7 mm PS insert, and the 32 anatomic patella botton.  The knee was brought to full extension with good flexion stability with the patella  ? tracking through the trochlea without application of pressure.  Given  ? all these findings the trial components removed.  Final components were  ? opened and cement was mixed.  The knee was irrigated with normal saline solution and pulse lavage.  The synovial lining was  ? then injected with 30 cc of 0.25% Marcaine with epinephrine, 1 cc of Toradol and 30 cc of NS for a total of 61 cc.  ?   ?Final implants were then cemented onto cleaned and dried cut surfaces of bone with the knee brought to extension with a size 7 mm PS trial insert.  ?   ? Once the cement had fully cured, excess cement was removed  ? throughout the knee.  I confirmed that I was satisfied with the range of  ? motion and stability, and the final size 7 mm PS AOX insert was chosen.  It was  ? placed into the knee.  ?   ? The tourniquet had been let down at 32 minutes.  No significant  ? hemostasis was required.  The extensor mechanism was then reapproximated using #1 Vicryl  and #1 Stratafix sutures with the knee  ? in flexion.  The  ? remaining wound was closed with 2-0  Vicryl and running 4-0 Monocryl.  ? The knee was cleaned, dried, dressed sterilely using Dermabond and  ? Aquacel dressing.  The patient was then  ? brought to recovery room in stable condition, tolerating the procedure  ? well.  ? ?Please note that Physician Assistant, Costella Hatcher, PA-C was present for the entirety of the case, and was utilized for pre-operative positioning, peri-operative retractor management, general facilitation of the procedure and for primary wound closure at the end of the case. ?   ?   ?   ?   ? Pietro Cassis Alvan Dame, M.D.  ? ? ?09/27/2021 8:44 AM  ?

## 2021-09-27 NOTE — Evaluation (Signed)
Physical Therapy Evaluation ?Patient Details ?Name: Isabella Valencia ?MRN: [id number] ?DOB: 03/04/1941 ?Today's Date: 09/27/2021 ? ?History of Present Illness ? Pt is 81 yo female s/p L TKA on 09/27/21.  She has hx including but not limited to R THA, R TKA, arthritis, Cervical disc disease  ?Clinical Impression ? Pt is s/p TKA resulting in the deficits listed below (see PT Problem List). At baseline, pt is independent and has DME.  Today, she was limited by pain and fatigue.  She was min A transfers and min A for a few steps to the chair.  Barriers to rehab include 6-7 steps to enter house and pt reports limited support (spouse not able to provide physical assist, daughter recently had surgery, granddaughter available intermittently).   Pt will benefit from skilled PT to increase their independence and safety with mobility to allow discharge to the venue listed below.  ?   ?   ? ?Recommendations for follow up therapy are one component of a multi-disciplinary discharge planning process, led by the attending physician.  Recommendations may be updated based on patient status, additional functional criteria and insurance authorization. ? ?Follow Up Recommendations Follow physician's recommendations for discharge plan and follow up therapies ? ?  ?Assistance Recommended at Discharge Frequent or constant Supervision/Assistance  ?Patient can return home with the following ? A little help with bathing/dressing/bathroom;A little help with walking and/or transfers;Help with stairs or ramp for entrance;Assistance with cooking/housework ? ?  ?Equipment Recommendations None recommended by PT  ?Recommendations for Other Services ?    ?  ?Functional Status Assessment Patient has had a recent decline in their functional status and demonstrates the ability to make significant improvements in function in a reasonable and predictable amount of time.  ? ?  ?Precautions / Restrictions Precautions ?Precautions: Fall ?Restrictions ?Weight  Bearing Restrictions: Yes ?LLE Weight Bearing: Weight bearing as tolerated  ? ?  ? ?Mobility ? Bed Mobility ?Overal bed mobility: Needs Assistance ?Bed Mobility: Supine to Sit ?  ?  ?Supine to sit: Min assist ?  ?  ?General bed mobility comments: Min A for L LE and cues ?  ? ?Transfers ?Overall transfer level: Needs assistance ?Equipment used: Rolling walker (2 wheels) ?Transfers: Sit to/from Stand ?Sit to Stand: Min assist ?  ?  ?  ?  ?  ?General transfer comment: cues for hand placement and light min A to rise ?  ? ?Ambulation/Gait ?Ambulation/Gait assistance: Min assist ?Gait Distance (Feet): 3 Feet ?Assistive device: Rolling walker (2 wheels) ?Gait Pattern/deviations: Step-to pattern, Decreased stride length, Decreased weight shift to left, Shuffle ?Gait velocity: decreased ?  ?  ?General Gait Details: Small steps to chair only - limited by pain ? ?Stairs ?  ?  ?  ?  ?  ? ?Wheelchair Mobility ?  ? ?Modified Rankin (Stroke Patients Only) ?  ? ?  ? ?Balance Overall balance assessment: Needs assistance ?Sitting-balance support: No upper extremity supported ?Sitting balance-Leahy Scale: Good ?  ?  ?Standing balance support: Bilateral upper extremity supported, Reliant on assistive device for balance ?Standing balance-Leahy Scale: Poor ?  ?  ?  ?  ?  ?  ?  ?  ?  ?  ?  ?  ?   ? ? ? ?Pertinent Vitals/Pain Pain Assessment ?Pain Assessment: 0-10 ?Pain Score: 3  ?Pain Location: L knee ?Pain Descriptors / Indicators: Discomfort ?Pain Intervention(s): Limited activity within patient's tolerance, Monitored during session, Premedicated before session, Ice applied  ? ? ?Home Living Family/patient  expects to be discharged to:: Private residence ?Living Arrangements: Spouse/significant other ?Available Help at Discharge: Family;Available 24 hours/day (Spouse is there but not able to provide much assist; granddaughter may be able to help some) ?Type of Home: House ?Home Access: Stairs to enter ?Entrance Stairs-Rails: Left;Right  (too far to reach) ?Entrance Stairs-Number of Steps: 7 ?Alternate Level Stairs-Number of Steps: flight ?Home Layout: Multi-level;Full bath on main level (bedroom is upstairs but trying to get small bed in living room) ?Home Equipment: Grab bars - tub/shower;Shower Land (2 wheels);Cane - single point ?   ?  ?Prior Function Prior Level of Function : Independent/Modified Independent;Driving ?  ?  ?  ?  ?  ?  ?Mobility Comments: Ambulates without AD; could ambulate in community ?ADLs Comments: Independent with ADLs and IADLs ?  ? ? ?Hand Dominance  ?   ? ?  ?Extremity/Trunk Assessment  ? Upper Extremity Assessment ?Upper Extremity Assessment: RUE deficits/detail;LUE deficits/detail ?RUE Deficits / Details: ROM WFL; MMT 4/5 ?LUE Deficits / Details: ROM WFL; MMT 4/5 ?  ? ?Lower Extremity Assessment ?Lower Extremity Assessment: LLE deficits/detail;RLE deficits/detail ?RLE Deficits / Details: ROM WFL; MMT 5/5 ?LLE Deficits / Details: ROM: anke and hip WFL, knee 5 to 80 ; MMT: ankle 5/5, knee 3/5, hip 3/5 ?  ? ?Cervical / Trunk Assessment ?Cervical / Trunk Assessment: Kyphotic  ?Communication  ? Communication: No difficulties  ?Cognition Arousal/Alertness: Awake/alert ?Behavior During Therapy: [facility] for tasks assessed/performed ?Overall Cognitive Status: Within Functional Limits for tasks assessed ?  ?  ?  ?  ?  ?  ?  ?  ?  ?  ?  ?  ?  ?  ?  ?  ?  ?  ?  ? ?  ?General Comments   ? ?  ?Exercises    ? ?Assessment/Plan  ?  ?PT Assessment Patient needs continued PT services  ?PT Problem List Decreased strength;Decreased mobility;Decreased safety awareness;Decreased range of motion;Decreased activity tolerance;Decreased balance;Decreased knowledge of use of DME ? ?   ?  ?PT Treatment Interventions DME instruction;Therapeutic activities;Modalities;Gait training;Therapeutic exercise;Patient/family education;Stair training;Functional mobility training   ? ?PT Goals (Current goals can be found in the Care Plan section)   ?Acute Rehab PT Goals ?Patient Stated Goal: return home ?PT Goal Formulation: With patient ?Time For Goal Achievement: 10/11/21 ?Potential to Achieve Goals: Good ? ?  ?Frequency 7X/week ?  ? ? ?Co-evaluation   ?  ?  ?  ?  ? ? ?  ?AM-PAC PT "6 Clicks" Mobility  ?Outcome Measure Help needed turning from your back to your side while in a flat bed without using bedrails?: A Little ?Help needed moving from lying on your back to sitting on the side of a flat bed without using bedrails?: A Little ?Help needed moving to and from a bed to a chair (including a wheelchair)?: A Little ?Help needed standing up from a chair using your arms (e.g., wheelchair or bedside chair)?: A Little ?Help needed to walk in hospital room?: Total ?Help needed climbing 3-5 steps with a railing? : Total ?6 Click Score: 14 ? ?  ?End of Session Equipment Utilized During Treatment: Gait belt ?Activity Tolerance: Patient tolerated treatment well ?Patient left: with call bell/phone within reach;in chair;with chair alarm set ?Nurse Communication: Mobility status ?PT Visit Diagnosis: Other abnormalities of gait and mobility (R26.89);Muscle weakness (generalized) (M62.81) ?  ? ?Time: 4401-0272 ?PT Time Calculation (min) (ACUTE ONLY): 24 min ? ? ?Charges:   PT Evaluation ?$PT Eval Low Complexity:  1 Low ?PT Treatments ?$Therapeutic Activity: 8-22 mins ?  ?   ? ? ?Abran Richard, PT ?Acute Rehab Services ?Pager [phone] ?[facility] [phone] ? ? ?Mikael Spray Kore Madlock ?09/27/2021, 4:42 PM ? ?

## 2021-09-27 NOTE — Discharge Instructions (Signed)

## 2021-09-27 NOTE — Interval H&P Note (Signed)
History and Physical Interval Note: ? ?09/27/2021 ?6:59 AM ? ?Isabella Valencia  has presented today for surgery, with the diagnosis of Left knee osteoarthritis.  The various methods of treatment have been discussed with the patient and family. After consideration of risks, benefits and other options for treatment, the patient has consented to  Procedure(s): ?TOTAL KNEE ARTHROPLASTY (Left) as a surgical intervention.  The patient's history has been reviewed, patient examined, no change in status, stable for surgery.  I have reviewed the patient's chart and labs.  Questions were answered to the patient's satisfaction.   ? ? ?Mauri Pole ? ? ?

## 2021-09-27 NOTE — Anesthesia Procedure Notes (Signed)
Spinal ? ?Patient location during procedure: OR ?Start time: 09/27/2021 7:19 AM ?End time: 09/27/2021 7:26 AM ?Reason for block: surgical anesthesia ?Staffing ?Performed: anesthesiologist  ?Anesthesiologist: Myrtie Soman, MD ?Preanesthetic Checklist ?Completed: patient identified, IV checked, site marked, risks and benefits discussed, surgical consent, monitors and equipment checked, pre-op evaluation and timeout performed ?Spinal Block ?Patient position: sitting ?Prep: Betadine ?Patient monitoring: heart rate, continuous pulse ox and blood pressure ?Approach: midline ?Location: L3-4 ?Injection technique: single-shot ?Needle ?Needle type: Spinocan  ?Needle gauge: 22 G ?Needle length: 9 cm ?Assessment ?Sensory level: T6 ?Events: CSF return ?Additional Notes ? ? ? ? ? ? ?

## 2021-09-27 NOTE — Anesthesia Procedure Notes (Signed)
Anesthesia Procedure Image    

## 2021-09-27 NOTE — Anesthesia Postprocedure Evaluation (Signed)
Anesthesia Post Note ? ?Patient: Isabella Valencia ? ?Procedure(s) Performed: TOTAL KNEE ARTHROPLASTY (Left: Knee) ? ?  ? ?Patient location during evaluation: PACU ?Anesthesia Type: Spinal ?Level of consciousness: oriented and awake and alert ?Pain management: pain level controlled ?Vital Signs Assessment: post-procedure vital signs reviewed and stable ?Respiratory status: spontaneous breathing, respiratory function stable and patient connected to nasal cannula oxygen ?Cardiovascular status: blood pressure returned to baseline and stable ?Postop Assessment: no headache, no backache and no apparent nausea or vomiting ?Anesthetic complications: no ? ? ?No notable events documented. ? ?Last Vitals:  ?Vitals:  ? 09/27/21 0930 09/27/21 0945  ?BP: (!) 107/56 120/69  ?Pulse: 72 71  ?Resp: 14 10  ?Temp:    ?SpO2: 99% 100%  ?  ?Last Pain:  ?Vitals:  ? 09/27/21 0945  ?TempSrc:   ?PainSc: Asleep  ? ? ?  ?  ?  ?  ?  ?  ? ?Domino Holten S ? ? ? ? ?

## 2021-09-27 NOTE — Transfer of Care (Signed)
Immediate Anesthesia Transfer of Care Note ? ?Patient: Isabella Valencia ? ?Procedure(s) Performed: TOTAL KNEE ARTHROPLASTY (Left: Knee) ? ?Patient Location: PACU ? ?Anesthesia Type:Spinal ? ?Level of Consciousness: awake, alert  and oriented ? ?Airway & Oxygen Therapy: Patient Spontanous Breathing and Patient connected to face mask oxygen ? ?Post-op Assessment: Report given to RN and Post -op Vital signs reviewed and stable ? ?Post vital signs: Reviewed and stable ? ?Last Vitals:  ?Vitals Value Taken Time  ?BP    ?Temp    ?Pulse    ?Resp    ?SpO2    ? ? ?Last Pain:  ?Vitals:  ? 09/27/21 0604  ?TempSrc: Oral  ?   ? ?  ? ?Complications: No notable events documented. ?

## 2021-09-28 ENCOUNTER — Encounter (HOSPITAL_COMMUNITY): Payer: Self-pay | Admitting: Orthopedic Surgery

## 2021-09-28 DIAGNOSIS — Z96641 Presence of right artificial hip joint: Secondary | ICD-10-CM | POA: Diagnosis not present

## 2021-09-28 DIAGNOSIS — J45909 Unspecified asthma, uncomplicated: Secondary | ICD-10-CM | POA: Diagnosis not present

## 2021-09-28 DIAGNOSIS — M1712 Unilateral primary osteoarthritis, left knee: Secondary | ICD-10-CM | POA: Diagnosis not present

## 2021-09-28 DIAGNOSIS — M25462 Effusion, left knee: Secondary | ICD-10-CM | POA: Diagnosis not present

## 2021-09-28 DIAGNOSIS — Z20822 Contact with and (suspected) exposure to covid-19: Secondary | ICD-10-CM | POA: Diagnosis not present

## 2021-09-28 DIAGNOSIS — Z85828 Personal history of other malignant neoplasm of skin: Secondary | ICD-10-CM | POA: Diagnosis not present

## 2021-09-28 DIAGNOSIS — M25762 Osteophyte, left knee: Secondary | ICD-10-CM | POA: Diagnosis not present

## 2021-09-28 DIAGNOSIS — Z96651 Presence of right artificial knee joint: Secondary | ICD-10-CM | POA: Diagnosis not present

## 2021-09-28 DIAGNOSIS — M659 Synovitis and tenosynovitis, unspecified: Secondary | ICD-10-CM | POA: Diagnosis not present

## 2021-09-28 DIAGNOSIS — Z79899 Other long term (current) drug therapy: Secondary | ICD-10-CM | POA: Diagnosis not present

## 2021-09-28 LAB — CBC
HCT: 31.6 % — ABNORMAL LOW (ref 36.0–46.0)
Hemoglobin: 10.1 g/dL — ABNORMAL LOW (ref 12.0–15.0)
MCH: 32.1 pg (ref 26.0–34.0)
MCHC: 32 g/dL (ref 30.0–36.0)
MCV: 100.3 fL — ABNORMAL HIGH (ref 80.0–100.0)
Platelets: 197 10*3/uL (ref 150–400)
RBC: 3.15 MIL/uL — ABNORMAL LOW (ref 3.87–5.11)
RDW: 12.6 % (ref 11.5–15.5)
WBC: 9.3 10*3/uL (ref 4.0–10.5)
nRBC: 0 % (ref 0.0–0.2)

## 2021-09-28 LAB — BASIC METABOLIC PANEL
Anion gap: 4 — ABNORMAL LOW (ref 5–15)
BUN: 18 mg/dL (ref 8–23)
CO2: 26 mmol/L (ref 22–32)
Calcium: 8.4 mg/dL — ABNORMAL LOW (ref 8.9–10.3)
Chloride: 106 mmol/L (ref 98–111)
Creatinine, Ser: 0.82 mg/dL (ref 0.44–1.00)
GFR, Estimated: >60 mL/min (ref >60)
Glucose, Bld: 99 mg/dL (ref 70–99)
Potassium: 4.7 mmol/L (ref 3.5–5.1)
Sodium: 136 mmol/L (ref 135–145)

## 2021-09-28 MED ORDER — METHOCARBAMOL 500 MG PO TABS: 500.0000 mg | ORAL_TABLET | Freq: Four times a day (QID) | ORAL | 0 refills | Status: DC | PRN

## 2021-09-28 MED ORDER — OXYCODONE HCL 5 MG PO TABS: 5.0000 mg | ORAL_TABLET | ORAL | 0 refills | Status: DC | PRN

## 2021-09-28 MED ORDER — ONDANSETRON HCL 4 MG PO TABS: 4.0000 mg | ORAL_TABLET | Freq: Four times a day (QID) | ORAL | 0 refills | Status: DC | PRN

## 2021-09-28 MED ORDER — CELECOXIB 200 MG PO CAPS: 200.0000 mg | ORAL_CAPSULE | Freq: Every day | ORAL | 0 refills | Status: DC

## 2021-09-28 MED ORDER — DOCUSATE SODIUM 100 MG PO CAPS: 100.0000 mg | ORAL_CAPSULE | Freq: Two times a day (BID) | ORAL | 0 refills | Status: DC

## 2021-09-28 MED ORDER — ASPIRIN 81 MG PO CHEW: 81.0000 mg | CHEWABLE_TABLET | Freq: Two times a day (BID) | ORAL | 0 refills | Status: DC

## 2021-09-28 MED ORDER — POLYETHYLENE GLYCOL 3350 17 G PO PACK: 17.0000 g | PACK | Freq: Every day | ORAL | 0 refills | Status: DC | PRN

## 2021-09-28 MED ORDER — ACETAMINOPHEN 325 MG PO TABS: 1000.0000 mg | ORAL_TABLET | Freq: Four times a day (QID) | ORAL | Status: DC

## 2021-09-28 NOTE — Progress Notes (Signed)
? ?Subjective: ?1 Day Post-Op Procedure(s) (LRB): ?TOTAL KNEE ARTHROPLASTY (Left) ?Patient reports pain as mild.   ?Patient seen in rounds by Dr. Alvan Dame. ?Patient is well, and has had no acute complaints or problems. No acute events overnight. Foley catheter removed. Patient ambulated 3 feet with PT, and was limited by pain and nausea/vomiting. ?We will start therapy today.  ? ?Objective: ?Vital signs in last 24 hours: ?Temp:  [97.4 ?F (36.3 ?C)-97.8 ?F (36.6 ?C)] 97.6 ?F (36.4 ?C) (03/29 6237) ?Pulse Rate:  [69-81] 77 (03/29 0548) ?Resp:  [10-17] 16 (03/29 0548) ?BP: (99-138)/(53-83) 102/63 (03/29 0548) ?SpO2:  [94 %-100 %] 99 % (03/29 0548) ?Weight:  [73.9 kg] 73.9 kg (03/28 1131) ? ?Intake/Output from previous day: ? ?Intake/Output Summary (Last 24 hours) at 09/28/2021 0751 ?Last data filed at 09/28/2021 0548 ?Gross per 24 hour  ?Intake 2196.95 ml  ?Output 1825 ml  ?Net 371.95 ml  ?  ? ?Intake/Output this shift: ?No intake/output data recorded. ? ?Labs: ?Recent Labs  ?  09/28/21 ?0254  ?HGB 10.1*  ? ?Recent Labs  ?  09/28/21 ?0254  ?WBC 9.3  ?RBC 3.15*  ?HCT 31.6*  ?PLT 197  ? ?Recent Labs  ?  09/28/21 ?0254  ?NA 136  ?K 4.7  ?CL 106  ?CO2 26  ?BUN 18  ?CREATININE 0.82  ?GLUCOSE 99  ?CALCIUM 8.4*  ? ?No results for input(s): LABPT, INR in the last 72 hours. ? ?Exam: ?General - Patient is Alert and Oriented ?Extremity - Neurologically intact ?Sensation intact distally ?Intact pulses distally ?Dorsiflexion/Plantar flexion intact ?Dressing - dressing C/D/I ?Motor Function - intact, moving foot and toes well on exam.  ? ?Past Medical History:  ?Diagnosis Date  ? Anemia   ? in her younger years (esp during pregnancy)  ? Arthritis 2004  ? neg ANA & RA  ? Asthma   ? Cancer [facility])   ? melanoma - upper arm   ? Cervical disc disease   ? Complication of anesthesia   ? reports had toruble waking up  ? Difficulty sleeping   ? due to knee pain  ? Heart murmur   ? mild  ? History of skin cancer   ? Hyperlipidemia   ? Loose bowel  movements   ? gassy, onset after hip surgery  ? MVP (mitral valve prolapse)   ? reports this is the cause of her murmur. denies any cardiac sx related to  MVP . does not have a cardiologist . reporst she gets her annual check ups of her heart at her pcp office   ? Pneumonia   ? in her 59's  ? ? ?Assessment/Plan: ?1 Day Post-Op Procedure(s) (LRB): ?TOTAL KNEE ARTHROPLASTY (Left) ?Principal Problem: ?  S/P total knee arthroplasty, left ? ?Estimated body mass index is 27.98 kg/m? as calculated from the following: ?  Height as of this encounter: '5\' 4"'$  (1.626 m). ?  Weight as of this encounter: 73.9 kg. ?Advance diet ?Up with therapy ?D/C IV fluids ? ? ?Patient's anticipated LOS is less than 2 midnights, meeting these requirements: ?- Younger than 33 ?- Lives within 1 hour of care ?- Has a competent adult at home to recover with post-op recover ?- NO history of ? - Chronic pain requiring opiods ? - Diabetes ? - Coronary Artery Disease ? - Heart failure ? - Heart attack ? - Stroke ? - DVT/VTE ? - Cardiac arrhythmia ? - Respiratory Failure/COPD ? - Renal failure ? - Anemia ? - Advanced Liver disease ? ?  ? ?  DVT Prophylaxis - Aspirin ?Weight bearing as tolerated. ? ?Hgb stable at 10.1 this AM. ? ?Plan is to go Home after hospital stay. Plan for discharge today following 1-2 sessions of PT as long as they are meeting their goals. Patient is scheduled for OPPT. Follow up in the office in 2 weeks.  ? ?Griffith Citron, PA-C ?Orthopedic Surgery ?[phone] ?09/28/2021, 7:51 AM  ?

## 2021-09-28 NOTE — Progress Notes (Addendum)
Physical Therapy Treatment ?Patient Details ?Name: Isabella Valencia ?MRN: [id number] ?DOB: 06-Apr-1941 ?Today's Date: 09/28/2021 ? ? ?History of Present Illness Pt is 81 yo female s/p L TKA on 09/27/21.  She has hx including but not limited to R THA, R TKA, arthritis, Cervical disc disease ? ?  ?PT Comments  ? ? Pt making gradual progress this afternoon.  She was able to increase gait to 74' with antalgic pattern but still not ready to perform steps.  Pt has 7 steps to enter her home but does report will stay with daughter till Monday who only has 1 STE but long uphill sidewalk.  She had some improvement in pain and reports mild dizziness but no lightheadedness this afternoon.  Continue to progress as needed.  ?   ?Recommendations for follow up therapy are one component of a multi-disciplinary discharge planning process, led by the attending physician.  Recommendations may be updated based on patient status, additional functional criteria and insurance authorization. ? ?Follow Up Recommendations ? Follow physician's recommendations for discharge plan and follow up therapies ?  ?  ?Assistance Recommended at Discharge Frequent or constant Supervision/Assistance  ?Patient can return home with the following A little help with bathing/dressing/bathroom;A little help with walking and/or transfers;Help with stairs or ramp for entrance;Assistance with cooking/housework ?  ?Equipment Recommendations ? None recommended by PT  ?  ?Recommendations for Other Services   ? ? ?  ?Precautions / Restrictions Precautions ?Precautions: Fall ?Restrictions ?Weight Bearing Restrictions: No ?LLE Weight Bearing: Weight bearing as tolerated  ?  ? ?Mobility ? Bed Mobility ?Overal bed mobility: Needs Assistance ?Bed Mobility: Sit to Supine ?  ?  ?Supine to sit: Min assist ?Sit to supine: Min assist ?  ?General bed mobility comments: Min A for L LE and cues ?  ? ?Transfers ?Overall transfer level: Needs assistance ?Equipment used: Rolling walker (2  wheels) ?Transfers: Sit to/from Stand ?Sit to Stand: Min assist ?  ?  ?  ?  ?  ?General transfer comment: cues for hand placement and light min A to rise from chair and toielt; cues for safety with sitting ?  ? ?Ambulation/Gait ?Ambulation/Gait assistance: Min assist ?Gait Distance (Feet): 60 Feet ?Assistive device: Rolling walker (2 wheels) ?Gait Pattern/deviations: Step-to pattern, Decreased stride length, Decreased weight shift to left, Antalgic ?Gait velocity: decreased ?  ?  ?General Gait Details: Cues for RW and sequencing; min A to steady ? ? ?Stairs ?Stairs:  (Pt declined steps today due to pain) ?  ?  ?  ?  ? ? ?Wheelchair Mobility ?  ? ?Modified Rankin (Stroke Patients Only) ?  ? ? ?  ?Balance Overall balance assessment: Needs assistance ?Sitting-balance support: No upper extremity supported ?Sitting balance-Leahy Scale: Good ?  ?  ?Standing balance support: Bilateral upper extremity supported, Reliant on assistive device for balance ?Standing balance-Leahy Scale: Poor ?  ?  ?  ?  ?  ?  ?  ?  ?  ?  ?  ?  ?  ? ?  ?Cognition Arousal/Alertness: Awake/alert ?Behavior During Therapy: [facility] for tasks assessed/performed ?Overall Cognitive Status: Within Functional Limits for tasks assessed ?  ?  ?  ?  ?  ?  ?  ?  ?  ?  ?  ?  ?  ?  ?  ?  ?  ?  ?  ? ?  ?Exercises Total Joint Exercises ?Ankle Circles/Pumps: AROM, Both, 10 reps, Supine ?Quad Sets: AROM, Both, 10 reps, Supine ?Heel Slides:  AAROM, Left, 10 reps, Supine ?Hip ABduction/ADduction: AAROM, Left, 10 reps, Supine ?Long Arc Quad: AAROM, 10 reps, Left, Seated ?Knee Flexion: AAROM, Left, 10 reps, Seated ?Goniometric ROM: L knee 5 to 80 ? ?  ?General Comments General comments (skin integrity, edema, etc.): Pt reports dizzy and lightheaded upon sitting.  BP was 104/80 sitting, 100/86 sitting 3 mins; 117/64 standing but reports dizzy- symptoms improved in chair ?  ?  ? ?Pertinent Vitals/Pain Pain Assessment ?Pain Assessment: 0-10 ?Pain Score: 6  ?Pain Location: L  knee ?Pain Descriptors / Indicators: Discomfort ?Pain Intervention(s): Limited activity within patient's tolerance, Monitored during session, Premedicated before session, Ice applied  ? ? ?Home Living   ?  ?  ?  ?  ?  ?  ?  ?  ?  ?   ?  ?Prior Function    ?  ?  ?   ? ?PT Goals (current goals can now be found in the care plan section) Progress towards PT goals: Progressing toward goals ? ?  ?Frequency ? ? ? 7X/week ? ? ? ?  ?PT Plan Current plan remains appropriate  ? ? ?Co-evaluation   ?  ?  ?  ?  ? ?  ?AM-PAC PT "6 Clicks" Mobility   ?Outcome Measure ? Help needed turning from your back to your side while in a flat bed without using bedrails?: A Little ?Help needed moving from lying on your back to sitting on the side of a flat bed without using bedrails?: A Little ?Help needed moving to and from a bed to a chair (including a wheelchair)?: A Little ?Help needed standing up from a chair using your arms (e.g., wheelchair or bedside chair)?: A Little ?Help needed to walk in hospital room?: A Little ?Help needed climbing 3-5 steps with a railing? : A Lot ?6 Click Score: 17 ? ?  ?End of Session Equipment Utilized During Treatment: Gait belt ?Activity Tolerance: Patient tolerated treatment well ?Patient left: with call bell/phone within reach;in bed;with bed alarm set;with family/visitor present ?Nurse Communication: Mobility status ?PT Visit Diagnosis: Other abnormalities of gait and mobility (R26.89);Muscle weakness (generalized) (M62.81) ?  ? ? ?Time: 6222-9798 ?PT Time Calculation (min) (ACUTE ONLY): 27 min ? ?Charges:  $Gait Training: 8-22 mins ?$Therapeutic Exercise: 8-22 mins ?$Therapeutic Activity: 8-22 mins          ?          ? ?Abran Richard, PT ?Acute Rehab Services ?Pager [phone] ?[facility] [phone] ? ? ? ?Isabella Valencia ?09/28/2021, 4:34 PM ? ?

## 2021-09-28 NOTE — Plan of Care (Signed)
?  Problem: Education: ?Goal: Knowledge of General Education information will improve ?Description: Including pain rating scale, medication(s)/side effects and non-pharmacologic comfort measures ?Outcome: Progressing ?  ?Problem: Health Behavior/Discharge Planning: ?Goal: Ability to manage health-related needs will improve ?Outcome: Progressing ?  ?Problem: Clinical Measurements: ?Goal: Ability to maintain clinical measurements within normal limits will improve ?Outcome: Progressing ?Goal: Will remain free from infection ?Outcome: Progressing ?Goal: Diagnostic test results will improve ?Outcome: Progressing ?Goal: Respiratory complications will improve ?Outcome: Progressing ?Goal: Cardiovascular complication will be avoided ?Outcome: Progressing ?  ?Problem: Activity: ?Goal: Risk for activity intolerance will decrease ?Outcome: Progressing ?  ?Problem: Nutrition: ?Goal: Adequate nutrition will be maintained ?Outcome: Progressing ?  ?Problem: Coping: ?Goal: Level of anxiety will decrease ?Outcome: Progressing ?  ?Problem: Elimination: ?Goal: Will not experience complications related to bowel motility ?Outcome: Progressing ?Goal: Will not experience complications related to urinary retention ?Outcome: Progressing ?  ?Problem: Pain Managment: ?Goal: General experience of comfort will improve ?Outcome: Progressing ?  ?Problem: Safety: ?Goal: Ability to remain free from injury will improve ?Outcome: Progressing ?  ?Problem: Skin Integrity: ?Goal: Risk for impaired skin integrity will decrease ?Outcome: Progressing ?  ?Problem: Education: ?Goal: Knowledge of the prescribed therapeutic regimen will improve ?Outcome: Progressing ?  ?Problem: Activity: ?Goal: Ability to avoid complications of mobility impairment will improve ?Outcome: Progressing ?Goal: Range of joint motion will improve ?Outcome: Progressing ?  ?Problem: Clinical Measurements: ?Goal: Postoperative complications will be avoided or minimized ?Outcome:  Progressing ?  ?Problem: Pain Management: ?Goal: Pain level will decrease with appropriate interventions ?Outcome: Progressing ?  ?Problem: Skin Integrity: ?Goal: Will show signs of wound healing ?Outcome: Progressing ?  ?

## 2021-09-28 NOTE — Plan of Care (Signed)
Plan of care reviewed and discussed with the patient. 

## 2021-09-28 NOTE — TOC Transition Note (Signed)
Transition of Care (TOC) - CM/SW Discharge Note ? ? ?Patient Details  ?Name: Tekeshia E Mcgillivray ?MRN: [id number] ?Date of Birth: March 02, 1941 ? ?Transition of Care (TOC) CM/SW Contact:  ?Mikya Don, LCSW ?Phone Number: ?09/28/2021, 10:49 AM ? ? ?Clinical Narrative:    ?Met with pt and confirming she has all needed DME at home.  Plan for OPPT at [facility].  No TOC needs. ? ? ?Final next level of care: OP Rehab ?Barriers to Discharge: No Barriers Identified ? ? ?Patient Goals and CMS Choice ?Patient states their goals for this hospitalization and ongoing recovery are:: return home ?  ?  ? ?Discharge Placement ?  ?           ?  ?  ?  ?  ? ?Discharge Plan and Services ?  ?  ?           ?DME Arranged: N/A ?DME Agency: NA ?  ?  ?  ?HH Arranged: NA ?[facility] Agency: NA ?  ?  ?  ? ?Social Determinants of Health (SDOH) Interventions ?  ? ? ?Readmission Risk Interventions ?   ? View : No data to display.  ?  ?  ?  ? ? ? ? ? ?

## 2021-09-28 NOTE — Progress Notes (Signed)
Physical Therapy Treatment ?Patient Details ?Name: Isabella Valencia ?MRN: [id number] ?DOB: 05/12/1941 ?Today's Date: 09/28/2021 ? ? ?History of Present Illness Pt is 81 yo female s/p L TKA on 09/27/21.  She has hx including but not limited to R THA, R TKA, arthritis, Cervical disc disease ? ?  ?PT Comments  ? ? Pt was limited by reports of dizziness/lightheadedness as well as pain- did have soft BP but no significant drop between sitting/standing.   Continue to progress as able.   ?Recommendations for follow up therapy are one component of a multi-disciplinary discharge planning process, led by the attending physician.  Recommendations may be updated based on patient status, additional functional criteria and insurance authorization. ? ?Follow Up Recommendations ? Follow physician's recommendations for discharge plan and follow up therapies ?  ?  ?Assistance Recommended at Discharge Frequent or constant Supervision/Assistance  ?Patient can return home with the following A little help with bathing/dressing/bathroom;A little help with walking and/or transfers;Help with stairs or ramp for entrance;Assistance with cooking/housework ?  ?Equipment Recommendations ? None recommended by PT  ?  ?Recommendations for Other Services   ? ? ?  ?Precautions / Restrictions Precautions ?Precautions: Fall ?Restrictions ?Weight Bearing Restrictions: No ?LLE Weight Bearing: Weight bearing as tolerated  ?  ? ?Mobility ? Bed Mobility ?Overal bed mobility: Needs Assistance ?Bed Mobility: Supine to Sit ?  ?  ?Supine to sit: Min assist ?  ?  ?General bed mobility comments: Min A for L LE and cues ?  ? ?Transfers ?Overall transfer level: Needs assistance ?Equipment used: Rolling walker (2 wheels) ?Transfers: Sit to/from Stand ?Sit to Stand: Min assist ?  ?  ?  ?  ?  ?General transfer comment: cues for hand placement and light min A to rise ?  ? ?Ambulation/Gait ?Ambulation/Gait assistance: Min assist ?Gait Distance (Feet): 3 Feet ?Assistive device:  Rolling walker (2 wheels) ?Gait Pattern/deviations: Step-to pattern, Decreased stride length, Decreased weight shift to left, Shuffle ?Gait velocity: decreased ?  ?  ?General Gait Details: Small steps to chair only - limited by dizziness ? ? ?Stairs ?  ?  ?  ?  ?  ? ? ?Wheelchair Mobility ?  ? ?Modified Rankin (Stroke Patients Only) ?  ? ? ?  ?Balance Overall balance assessment: Needs assistance ?Sitting-balance support: No upper extremity supported ?Sitting balance-Leahy Scale: Good ?  ?  ?Standing balance support: Bilateral upper extremity supported, Reliant on assistive device for balance ?Standing balance-Leahy Scale: Poor ?  ?  ?  ?  ?  ?  ?  ?  ?  ?  ?  ?  ?  ? ?  ?Cognition Arousal/Alertness: Awake/alert ?Behavior During Therapy: [facility] for tasks assessed/performed ?Overall Cognitive Status: Within Functional Limits for tasks assessed ?  ?  ?  ?  ?  ?  ?  ?  ?  ?  ?  ?  ?  ?  ?  ?  ?  ?  ?  ? ?  ?Exercises Total Joint Exercises ?Ankle Circles/Pumps: AROM, Both, 10 reps, Supine ?Quad Sets: AROM, Both, 10 reps, Supine ?Heel Slides: AAROM, Left, 10 reps, Supine ?Hip ABduction/ADduction: AAROM, Left, 10 reps, Supine ?Long Arc Quad: AAROM, 10 reps, Left, Seated ?Knee Flexion: AAROM, Left, 10 reps, Seated ?Goniometric ROM: L knee 5 to 80 ? ?  ?General Comments General comments (skin integrity, edema, etc.): Pt reports dizzy and lightheaded upon sitting.  BP was 104/80 sitting, 100/86 sitting 3 mins; 117/64 standing but reports dizzy- symptoms  improved in chair ?  ?  ? ?Pertinent Vitals/Pain Pain Assessment ?Pain Assessment: 0-10 ?Pain Score: 6  ?Pain Location: L knee ?Pain Descriptors / Indicators: Discomfort ?Pain Intervention(s): Limited activity within patient's tolerance, Monitored during session, Premedicated before session, Ice applied  ? ? ?Home Living   ?  ?  ?  ?  ?  ?  ?  ?  ?  ?   ?  ?Prior Function    ?  ?  ?   ? ?PT Goals (current goals can now be found in the care plan section) Progress towards PT goals:  Progressing toward goals ? ?  ?Frequency ? ? ? 7X/week ? ? ? ?  ?PT Plan Current plan remains appropriate  ? ? ?Co-evaluation   ?  ?  ?  ?  ? ?  ?AM-PAC PT "6 Clicks" Mobility   ?Outcome Measure ? Help needed turning from your back to your side while in a flat bed without using bedrails?: A Little ?Help needed moving from lying on your back to sitting on the side of a flat bed without using bedrails?: A Little ?Help needed moving to and from a bed to a chair (including a wheelchair)?: A Little ?Help needed standing up from a chair using your arms (e.g., wheelchair or bedside chair)?: A Little ?Help needed to walk in hospital room?: Total ?Help needed climbing 3-5 steps with a railing? : Total ?6 Click Score: 14 ? ?  ?End of Session Equipment Utilized During Treatment: Gait belt ?Activity Tolerance: Patient tolerated treatment well ?Patient left: with call bell/phone within reach;in chair;with chair alarm set ?Nurse Communication: Mobility status ?PT Visit Diagnosis: Other abnormalities of gait and mobility (R26.89);Muscle weakness (generalized) (M62.81) ?  ? ? ?Time: 1037-1100 ?PT Time Calculation (min) (ACUTE ONLY): 23 min ? ?Charges:  $Therapeutic Exercise: 8-22 mins ?$Therapeutic Activity: 8-22 mins          ?          ? ?Abran Richard, PT ?Acute Rehab Services ?Pager [phone] ?[facility] [phone] ? ? ? ?Mikael Spray Aylee Littrell ?09/28/2021, 1:45 PM ? ?

## 2021-09-29 DIAGNOSIS — J45909 Unspecified asthma, uncomplicated: Secondary | ICD-10-CM | POA: Diagnosis not present

## 2021-09-29 DIAGNOSIS — Z20822 Contact with and (suspected) exposure to covid-19: Secondary | ICD-10-CM | POA: Diagnosis not present

## 2021-09-29 DIAGNOSIS — M25762 Osteophyte, left knee: Secondary | ICD-10-CM | POA: Diagnosis not present

## 2021-09-29 DIAGNOSIS — Z85828 Personal history of other malignant neoplasm of skin: Secondary | ICD-10-CM | POA: Diagnosis not present

## 2021-09-29 DIAGNOSIS — M25462 Effusion, left knee: Secondary | ICD-10-CM | POA: Diagnosis not present

## 2021-09-29 DIAGNOSIS — Z79899 Other long term (current) drug therapy: Secondary | ICD-10-CM | POA: Diagnosis not present

## 2021-09-29 DIAGNOSIS — Z96641 Presence of right artificial hip joint: Secondary | ICD-10-CM | POA: Diagnosis not present

## 2021-09-29 DIAGNOSIS — M1712 Unilateral primary osteoarthritis, left knee: Secondary | ICD-10-CM | POA: Diagnosis not present

## 2021-09-29 DIAGNOSIS — Z96651 Presence of right artificial knee joint: Secondary | ICD-10-CM | POA: Diagnosis not present

## 2021-09-29 DIAGNOSIS — M659 Synovitis and tenosynovitis, unspecified: Secondary | ICD-10-CM | POA: Diagnosis not present

## 2021-09-29 LAB — CBC
HCT: 32.7 % — ABNORMAL LOW (ref 36.0–46.0)
Hemoglobin: 10.8 g/dL — ABNORMAL LOW (ref 12.0–15.0)
MCH: 32.7 pg (ref 26.0–34.0)
MCHC: 33 g/dL (ref 30.0–36.0)
MCV: 99.1 fL (ref 80.0–100.0)
Platelets: 211 10*3/uL (ref 150–400)
RBC: 3.3 MIL/uL — ABNORMAL LOW (ref 3.87–5.11)
RDW: 12.6 % (ref 11.5–15.5)
WBC: 10 10*3/uL (ref 4.0–10.5)
nRBC: 0 % (ref 0.0–0.2)

## 2021-09-29 MED ORDER — CELECOXIB 200 MG PO CAPS
200.0000 mg | ORAL_CAPSULE | Freq: Every day | ORAL | 0 refills | Status: DC
Start: 2021-09-29 — End: 2023-03-18

## 2021-09-29 MED ORDER — OXYCODONE HCL 5 MG PO TABS: 5.0000 mg | ORAL_TABLET | ORAL | 0 refills | Status: DC | PRN

## 2021-09-29 MED ORDER — DOCUSATE SODIUM 100 MG PO CAPS
100.0000 mg | ORAL_CAPSULE | Freq: Two times a day (BID) | ORAL | 0 refills | Status: DC
Start: 2021-09-29 — End: 2023-05-25

## 2021-09-29 MED ORDER — POLYETHYLENE GLYCOL 3350 17 G PO PACK: 17.0000 g | PACK | Freq: Every day | ORAL | 0 refills | Status: DC | PRN

## 2021-09-29 MED ORDER — ACETAMINOPHEN 325 MG PO TABS: 1000.0000 mg | ORAL_TABLET | Freq: Four times a day (QID) | ORAL | Status: AC

## 2021-09-29 MED ORDER — ASPIRIN 81 MG PO CHEW
81.0000 mg | CHEWABLE_TABLET | Freq: Two times a day (BID) | ORAL | 0 refills | Status: AC
Start: 2021-09-29 — End: 2021-10-27

## 2021-09-29 MED ORDER — METHOCARBAMOL 500 MG PO TABS: 500.0000 mg | ORAL_TABLET | Freq: Four times a day (QID) | ORAL | 0 refills | Status: DC | PRN

## 2021-09-29 MED ORDER — ONDANSETRON HCL 4 MG PO TABS: 4.0000 mg | ORAL_TABLET | Freq: Four times a day (QID) | ORAL | 0 refills | Status: DC | PRN

## 2021-09-29 NOTE — Plan of Care (Signed)
?  Problem: Education: ?Goal: Knowledge of General Education information will improve ?Description: Including pain rating scale, medication(s)/side effects and non-pharmacologic comfort measures ?Outcome: Progressing ?  ?Problem: Health Behavior/Discharge Planning: ?Goal: Ability to manage health-related needs will improve ?Outcome: Progressing ?  ?Problem: Clinical Measurements: ?Goal: Ability to maintain clinical measurements within normal limits will improve ?Outcome: Progressing ?Goal: Will remain free from infection ?Outcome: Progressing ?Goal: Diagnostic test results will improve ?Outcome: Progressing ?Goal: Respiratory complications will improve ?Outcome: Progressing ?Goal: Cardiovascular complication will be avoided ?Outcome: Progressing ?  ?Problem: Activity: ?Goal: Risk for activity intolerance will decrease ?Outcome: Progressing ?  ?Problem: Nutrition: ?Goal: Adequate nutrition will be maintained ?Outcome: Progressing ?  ?Problem: Coping: ?Goal: Level of anxiety will decrease ?Outcome: Progressing ?  ?Problem: Elimination: ?Goal: Will not experience complications related to bowel motility ?Outcome: Progressing ?  ?Problem: Pain Managment: ?Goal: General experience of comfort will improve ?Outcome: Progressing ?  ?Problem: Safety: ?Goal: Ability to remain free from injury will improve ?Outcome: Progressing ?  ?Problem: Skin Integrity: ?Goal: Risk for impaired skin integrity will decrease ?Outcome: Progressing ?  ?Problem: Education: ?Goal: Knowledge of the prescribed therapeutic regimen will improve ?Outcome: Progressing ?  ?Problem: Activity: ?Goal: Ability to avoid complications of mobility impairment will improve ?Outcome: Progressing ?Goal: Range of joint motion will improve ?Outcome: Progressing ?  ?Problem: Clinical Measurements: ?Goal: Postoperative complications will be avoided or minimized ?Outcome: Progressing ?  ?Problem: Pain Management: ?Goal: Pain level will decrease with appropriate  interventions ?Outcome: Progressing ?  ?Problem: Skin Integrity: ?Goal: Will show signs of wound healing ?Outcome: Progressing ?  ?

## 2021-09-29 NOTE — Progress Notes (Signed)
? ?Subjective: ?2 Days Post-Op Procedure(s) (LRB): ?TOTAL KNEE ARTHROPLASTY (Left) ?Patient reports pain as mild.   ?Patient seen in rounds for Dr. Alvan Dame. ?Patient is well, and has had no acute complaints or problems. No acute events overnight. Voiding without difficulty. Patient has concerns about her IV, which I looked at with her today. Patient ambulated 60 feet with PT.  ?We will continue therapy today.  ? ?Objective: ?Vital signs in last 24 hours: ?Temp:  [97.5 ?F (36.4 ?C)-97.9 ?F (36.6 ?C)] 97.7 ?F (36.5 ?C) (03/30 4403) ?Pulse Rate:  [73-84] 82 (03/30 0552) ?Resp:  [16-19] 16 (03/30 0552) ?BP: (103-139)/(59-74) 139/67 (03/30 4742) ?SpO2:  [97 %-99 %] 97 % (03/30 0552) ? ?Intake/Output from previous day: ? ?Intake/Output Summary (Last 24 hours) at 09/29/2021 0753 ?Last data filed at 09/29/2021 5956 ?Gross per 24 hour  ?Intake 1180.74 ml  ?Output 1400 ml  ?Net -219.26 ml  ?  ? ?Intake/Output this shift: ?No intake/output data recorded. ? ?Labs: ?Recent Labs  ?  09/28/21 ?0254 09/29/21 ?3875  ?HGB 10.1* 10.8*  ? ?Recent Labs  ?  09/28/21 ?0254 09/29/21 ?6433  ?WBC 9.3 10.0  ?RBC 3.15* 3.30*  ?HCT 31.6* 32.7*  ?PLT 197 211  ? ?Recent Labs  ?  09/28/21 ?0254  ?NA 136  ?K 4.7  ?CL 106  ?CO2 26  ?BUN 18  ?CREATININE 0.82  ?GLUCOSE 99  ?CALCIUM 8.4*  ? ?No results for input(s): LABPT, INR in the last 72 hours. ? ?Exam: ?General - Patient is Alert and Oriented ?Extremity - Neurologically intact ?Sensation intact distally ?Intact pulses distally ?Dorsiflexion/Plantar flexion intact ?Dressing - dressing C/D/I ?Motor Function - intact, moving foot and toes well on exam.  ? ?Past Medical History:  ?Diagnosis Date  ? Anemia   ? in her younger years (esp during pregnancy)  ? Arthritis 2004  ? neg ANA & RA  ? Asthma   ? Cancer [facility])   ? melanoma - upper arm   ? Cervical disc disease   ? Complication of anesthesia   ? reports had toruble waking up  ? Difficulty sleeping   ? due to knee pain  ? Heart murmur   ? mild  ? History  of skin cancer   ? Hyperlipidemia   ? Loose bowel movements   ? gassy, onset after hip surgery  ? MVP (mitral valve prolapse)   ? reports this is the cause of her murmur. denies any cardiac sx related to  MVP . does not have a cardiologist . reporst she gets her annual check ups of her heart at her pcp office   ? Pneumonia   ? in her 30's  ? ? ?Assessment/Plan: ?2 Days Post-Op Procedure(s) (LRB): ?TOTAL KNEE ARTHROPLASTY (Left) ?Principal Problem: ?  S/P total knee arthroplasty, left ? ?Estimated body mass index is 27.98 kg/m? as calculated from the following: ?  Height as of this encounter: '5\' 4"'$  (1.626 m). ?  Weight as of this encounter: 73.9 kg. ?Advance diet ?Up with therapy ?D/C IV fluids ? ? ?Patient's anticipated LOS is less than 2 midnights, meeting these requirements: ?- Younger than 65 ?- Lives within 1 hour of care ?- Has a competent adult at home to recover with post-op recover ?- NO history of ? - Chronic pain requiring opiods ? - Diabetes ? - Coronary Artery Disease ? - Heart failure ? - Heart attack ? - Stroke ? - DVT/VTE ? - Cardiac arrhythmia ? - Respiratory Failure/COPD ? - Renal failure ? -  Anemia ? - Advanced Liver disease ? ?  ? ?DVT Prophylaxis - Aspirin ?Weight bearing as tolerated. ? ?Plan is to go Home after hospital stay. Plan for discharge today following 1-2 sessions of PT as long as they are meeting their goals. Patient is scheduled for OPPT. Follow up in the office in 2 weeks.  ? ?Griffith Citron, PA-C ?Orthopedic Surgery ?[phone] ?09/29/2021, 7:53 AM  ?

## 2021-09-29 NOTE — Progress Notes (Signed)
Pt is ready for discharge.  PIV removed.  Remains alert and oriented.  Family member at bedside.  AVS given.  Pt able to verbalize understanding of all discharge instructions, including wound care and signs of infections.  All questions answered. ?

## 2021-09-29 NOTE — Progress Notes (Signed)
Physical Therapy Treatment ?Patient Details ?Name: Isabella Valencia ?MRN: [id number] ?DOB: [birth year] ?Today's Date: 09/29/2021 ? ? ?History of Present Illness Pt is 81 yo female s/p L TKA on 09/27/21.  She has hx including but not limited to R THA, R TKA, arthritis, Cervical disc disease ? ?  ?PT Comments  ? ? Patient making excellent progress with mobility and demonstrates good recall for safe walker management. Pt's daughter present this session and provided safe guarding for pt with gait and stair negotiation. No overt LOB noted throughout. EOS reviewed HEP and addressed questions for discharge. Patient will benefit form skilled PT interventions to progress mobility. Will continue to progress during stay. She is mobilizing at safe level for discharge home with assist from daughter. ? ?  ?Recommendations for follow up therapy are one component of a multi-disciplinary discharge planning process, led by the attending physician.  Recommendations may be updated based on patient status, additional functional criteria and insurance authorization. ? ?Follow Up Recommendations ? Follow physician's recommendations for discharge plan and follow up therapies ?  ?  ?Assistance Recommended at Discharge Frequent or constant Supervision/Assistance  ?Patient can return home with the following A little help with bathing/dressing/bathroom;A little help with walking and/or transfers;Help with stairs or ramp for entrance;Assistance with cooking/housework ?  ?Equipment Recommendations ? None recommended by PT  ?  ?Recommendations for Other Services   ? ? ?  ?Precautions / Restrictions Precautions ?Precautions: Fall ?Restrictions ?Weight Bearing Restrictions: No ?LLE Weight Bearing: Weight bearing as tolerated  ?  ? ?Mobility ? Bed Mobility ?Overal bed mobility: Needs Assistance ?Bed Mobility: Sit to Supine ?  ?  ?  ?Sit to supine: Min guard, HOB elevated ?  ?General bed mobility comments: pt OOB in recliner ?  ? ?Transfers ?Overall transfer  level: Needs assistance ?Equipment used: Rolling walker (2 wheels) ?Transfers: Sit to/from Stand ?Sit to Stand: Min guard, Supervision ?  ?  ?  ?  ?  ?General transfer comment: pt using bil UE to power up from recliner, demonstrated safe recall for reach back to sit. ?  ? ?Ambulation/Gait ?Ambulation/Gait assistance: Min guard, Supervision ?Gait Distance (Feet): 60 Feet ?Assistive device: Rolling walker (2 wheels) ?Gait Pattern/deviations: Step-to pattern, Decreased stride length, Decreased weight shift to left, Antalgic ?Gait velocity: decreased ?  ?  ?General Gait Details: pt demonstrated good recall for safe step pattern and proximity to RW. pt's daughter present and provided safe guarding with cues and supervision from therapist. ? ? ?Stairs ?Stairs: Yes ?Stairs assistance: Supervision, Min assist ?Stair Management: No rails, Step to pattern, Forwards, With walker ?Number of Stairs: 1 ?General stair comments: VC's for sequencing curb negotiation "up with good down with bad". pt's daughter provided safe gaurding technique and assit for walker stability. ? ? ?Wheelchair Mobility ?  ? ?Modified Rankin (Stroke Patients Only) ?  ? ? ?  ?Balance Overall balance assessment: Needs assistance ?Sitting-balance support: No upper extremity supported ?Sitting balance-Leahy Scale: Good ?  ?  ?Standing balance support: Bilateral upper extremity supported, Reliant on assistive device for balance ?Standing balance-Leahy Scale: Poor ?  ?  ?  ?  ?  ?  ?  ?  ?  ?  ?  ?  ?  ? ?  ?Cognition Arousal/Alertness: Awake/alert ?Behavior During Therapy: [facility] for tasks assessed/performed ?Overall Cognitive Status: Within Functional Limits for tasks assessed ?  ?  ?  ?  ?  ?  ?  ?  ?  ?  ?  ?  ?  ?  ?  ?  ?  ?  ?  ? ?  ?  Exercises Total Joint Exercises ?Ankle Circles/Pumps: AROM, Both, 10 reps, Seated ?Quad Sets: AROM, Both, 5 reps, Seated ?Short Arc Quad: AROM, Both, 5 reps, Seated ?Heel Slides: AAROM, Left, 5 reps, Seated ?Hip  ABduction/ADduction: AROM, Both, 5 reps, Seated ? ?  ?General Comments   ?  ?  ? ?Pertinent Vitals/Pain Pain Assessment ?Pain Assessment: 0-10 ?Pain Score: 3  ?Pain Location: L knee ?Pain Descriptors / Indicators: Discomfort ?Pain Intervention(s): Limited activity within patient's tolerance, Monitored during session, Repositioned, Ice applied  ? ? ?Home Living   ?  ?  ?  ?  ?  ?  ?  ?  ?  ?   ?  ?Prior Function    ?  ?  ?   ? ?PT Goals (current goals can now be found in the care plan section) Acute Rehab PT Goals ?Patient Stated Goal: return home ?PT Goal Formulation: With patient ?Time For Goal Achievement: 10/11/21 ?Potential to Achieve Goals: Good ?Progress towards PT goals: Progressing toward goals ? ?  ?Frequency ? ? ? 7X/week ? ? ? ?  ?PT Plan Current plan remains appropriate  ? ? ?Co-evaluation   ?  ?  ?  ?  ? ?  ?AM-PAC PT "6 Clicks" Mobility   ?Outcome Measure ? Help needed turning from your back to your side while in a flat bed without using bedrails?: A Little ?Help needed moving from lying on your back to sitting on the side of a flat bed without using bedrails?: A Little ?Help needed moving to and from a bed to a chair (including a wheelchair)?: A Little ?Help needed standing up from a chair using your arms (e.g., wheelchair or bedside chair)?: A Little ?Help needed to walk in hospital room?: A Little ?Help needed climbing 3-5 steps with a railing? : A Little ?6 Click Score: 18 ? ?  ?End of Session Equipment Utilized During Treatment: Gait belt ?Activity Tolerance: Patient tolerated treatment well ?Patient left: with call bell/phone within reach;in bed;with bed alarm set;with family/visitor present ?Nurse Communication: Mobility status ?PT Visit Diagnosis: Other abnormalities of gait and mobility (R26.89);Muscle weakness (generalized) (M62.81) ?  ? ? ?Time: 2992-4268 ?PT Time Calculation (min) (ACUTE ONLY): 17 min ? ?Charges:  $Gait Training: 8-22 mins          ?          ? ?Gwynneth Albright PT, DPT ?Acute  Rehabilitation Services ?Office [phone] ?Pager [phone]  ? ? ?Jacques Navy ?09/29/2021, 3:01 PM ? ?

## 2021-09-29 NOTE — Progress Notes (Signed)
Physical Therapy Treatment ?Patient Details ?Name: Isabella Valencia ?MRN: [id number] ?DOB: 1940-12-07 ?Today's Date: 09/29/2021 ? ? ?History of Present Illness Pt is 81 yo female s/p L TKA on 09/27/21.  She has hx including but not limited to R THA, R TKA, arthritis, Cervical disc disease ? ?  ?PT Comments  ? ? Patient is making steady progress with mobility and required min cues for transfer/gait technique with RW. No overt LOB noted throughout. Pt ambulated ~160' with min guard for safety, no overt LOB noted or buckling, pace slow and cautious. EOS reviewed HEP for circulation and ROM. Will follow up for stair training with pt's daughter present in preparation for safe discharge home. ? ?  ?Recommendations for follow up therapy are one component of a multi-disciplinary discharge planning process, led by the attending physician.  Recommendations may be updated based on patient status, additional functional criteria and insurance authorization. ? ?Follow Up Recommendations ? Follow physician's recommendations for discharge plan and follow up therapies ?  ?  ?Assistance Recommended at Discharge Frequent or constant Supervision/Assistance  ?Patient can return home with the following A little help with bathing/dressing/bathroom;A little help with walking and/or transfers;Help with stairs or ramp for entrance;Assistance with cooking/housework ?  ?Equipment Recommendations ? None recommended by PT  ?  ?Recommendations for Other Services   ? ? ?  ?Precautions / Restrictions Precautions ?Precautions: Fall ?Restrictions ?Weight Bearing Restrictions: No ?LLE Weight Bearing: Weight bearing as tolerated  ?  ? ?Mobility ? Bed Mobility ?Overal bed mobility: Needs Assistance ?Bed Mobility: Sit to Supine ?  ?  ?  ?Sit to supine: Min guard, HOB elevated ?  ?General bed mobility comments: cues to use belt to assist Lt LE off EOB. pt taking extra time to pivot. ?  ? ?Transfers ?Overall transfer level: Needs assistance ?Equipment used:  Rolling walker (2 wheels) ?Transfers: Sit to/from Stand ?Sit to Stand: Min guard ?  ?  ?  ?  ?  ?General transfer comment: cues for technique with power up from EOB and for safe reach back to sit in recliner. ?  ? ?Ambulation/Gait ?Ambulation/Gait assistance: Min guard ?Gait Distance (Feet): 160 Feet ?Assistive device: Rolling walker (2 wheels) ?Gait Pattern/deviations: Step-to pattern, Decreased stride length, Decreased weight shift to left, Antalgic ?Gait velocity: decreased ?  ?  ?General Gait Details: Cues for RW and sequencing; min A to steady ? ? ?Stairs ?  ?  ?  ?  ?  ? ? ?Wheelchair Mobility ?  ? ?Modified Rankin (Stroke Patients Only) ?  ? ? ?  ?Balance Overall balance assessment: Needs assistance ?Sitting-balance support: No upper extremity supported ?Sitting balance-Leahy Scale: Good ?  ?  ?Standing balance support: Bilateral upper extremity supported, Reliant on assistive device for balance ?Standing balance-Leahy Scale: Poor ?  ?  ?  ?  ?  ?  ?  ?  ?  ?  ?  ?  ?  ? ?  ?Cognition Arousal/Alertness: Awake/alert ?Behavior During Therapy: [facility] for tasks assessed/performed ?Overall Cognitive Status: Within Functional Limits for tasks assessed ?  ?  ?  ?  ?  ?  ?  ?  ?  ?  ?  ?  ?  ?  ?  ?  ?  ?  ?  ? ?  ?Exercises Total Joint Exercises ?Ankle Circles/Pumps: AROM, Both, 10 reps, Seated ?Quad Sets: AROM, Both, 5 reps, Seated ?Heel Slides: AAROM, Left, 5 reps, Seated ? ?  ?General Comments   ?  ?  ? ?  Pertinent Vitals/Pain Pain Assessment ?Pain Assessment: 0-10 ?Pain Score: 4  ?Pain Location: L knee ?Pain Descriptors / Indicators: Discomfort ?Pain Intervention(s): Limited activity within patient's tolerance, Monitored during session, Repositioned, Premedicated before session, Ice applied  ? ? ?Home Living   ?  ?  ?  ?  ?  ?  ?  ?  ?  ?   ?  ?Prior Function    ?  ?  ?   ? ?PT Goals (current goals can now be found in the care plan section) Acute Rehab PT Goals ?Patient Stated Goal: return home ?PT Goal Formulation:  With patient ?Time For Goal Achievement: 10/11/21 ?Potential to Achieve Goals: Good ?Progress towards PT goals: Progressing toward goals ? ?  ?Frequency ? ? ? 7X/week ? ? ? ?  ?PT Plan Current plan remains appropriate  ? ? ?Co-evaluation   ?  ?  ?  ?  ? ?  ?AM-PAC PT "6 Clicks" Mobility   ?Outcome Measure ? Help needed turning from your back to your side while in a flat bed without using bedrails?: A Little ?Help needed moving from lying on your back to sitting on the side of a flat bed without using bedrails?: A Little ?Help needed moving to and from a bed to a chair (including a wheelchair)?: A Little ?Help needed standing up from a chair using your arms (e.g., wheelchair or bedside chair)?: A Little ?Help needed to walk in hospital room?: A Little ?Help needed climbing 3-5 steps with a railing? : A Little ?6 Click Score: 18 ? ?  ?End of Session Equipment Utilized During Treatment: Gait belt ?Activity Tolerance: Patient tolerated treatment well ?Patient left: with call bell/phone within reach;in bed;with bed alarm set;with family/visitor present ?Nurse Communication: Mobility status ?PT Visit Diagnosis: Other abnormalities of gait and mobility (R26.89);Muscle weakness (generalized) (M62.81) ?  ? ? ?Time: 0947-0962 ?PT Time Calculation (min) (ACUTE ONLY): 20 min ? ?Charges:  $Therapeutic Exercise: 8-22 mins          ?          ? ?Gwynneth Albright PT, DPT ?Acute Rehabilitation Services ?Office [phone] ?Pager [phone]  ? ? ?Jacques Navy ?09/29/2021, 2:41 PM ? ?

## 2021-10-07 NOTE — Discharge Summary (Signed)
Physician Discharge Summary  ? ?Patient ID: ?Isabella Valencia ?MRN: [id number] ?DOB/AGE: 10/25/1940 81 y.o. ? ?Admit date: 09/27/2021 ?Discharge date: 09/29/2021 ? ?Primary Diagnosis: Left knee osteoarthritis ? ?Admission Diagnoses:  ?Past Medical History:  ?Diagnosis Date  ? Anemia   ? in her younger years (esp during pregnancy)  ? Arthritis 2004  ? neg ANA & RA  ? Asthma   ? Cancer [facility])   ? melanoma - upper arm   ? Cervical disc disease   ? Complication of anesthesia   ? reports had toruble waking up  ? Difficulty sleeping   ? due to knee pain  ? Heart murmur   ? mild  ? History of skin cancer   ? Hyperlipidemia   ? Loose bowel movements   ? gassy, onset after hip surgery  ? MVP (mitral valve prolapse)   ? reports this is the cause of her murmur. denies any cardiac sx related to  MVP . does not have a cardiologist . reporst she gets her annual check ups of her heart at her pcp office   ? Pneumonia   ? in her 20's  ? ?Discharge Diagnoses:   ?Principal Problem: ?  S/P total knee arthroplasty, left ? ?Estimated body mass index is 27.98 kg/m? as calculated from the following: ?  Height as of this encounter: '5\' 4"'$  (1.626 m). ?  Weight as of this encounter: 73.9 kg. ? ?Procedure:  ?Procedure(s) (LRB): ?TOTAL KNEE ARTHROPLASTY (Left)  ? ?Consults: None ? ?HPI: Isabella Valencia is a 81 y.o. female patient of  ? mine.  The patient had been seen, evaluated, and treated for months conservatively in the  ? office with medication, activity modification, and injections.  The patient had  ? radiographic changes of bone-on-bone arthritis with endplate sclerosis and osteophytes noted.  Based on the radiographic changes and failed conservative measures, the patient  ? decided to proceed with definitive treatment, total knee replacement.  Risks of infection, DVT, component failure, need for revision surgery, neurovascular injury were reviewed in the office setting.  The postop course was reviewed stressing the efforts to maximize  post-operative satisfaction and function.  Consent was obtained for benefit of pain  ? relief.  ? ?Laboratory Data: ?Admission on 09/27/2021, Discharged on 09/29/2021  ?Component Date Value Ref Range Status  ? SARS Coronavirus 2 09/27/2021 NEGATIVE  NEGATIVE Final  ? Comment: (NOTE) ?SARS-CoV-2 target nucleic acids are NOT DETECTED. ? ?The SARS-CoV-2 RNA is generally detectable in upper and lower ?respiratory specimens during the acute phase of infection. The lowest ?concentration of SARS-CoV-2 viral copies this assay can detect is 250 ?copies / mL. A negative result does not preclude SARS-CoV-2 infection ?and should not be used as the sole basis for treatment or other ?patient management decisions.  A negative result may occur with ?improper specimen collection / handling, submission of specimen other ?than nasopharyngeal swab, presence of viral mutation(s) within the ?areas targeted by this assay, and inadequate number of viral copies ?(<250 copies / mL). A negative result must be combined with clinical ?observations, patient history, and epidemiological information. ? ?Fact Sheet for Patients:   ?StrictlyIdeas.no ? ?Fact Sheet for Healthcare Providers: ?BankingDealers.co.za ? ?This test is not yet approved or  ?                         cleared by the [place] FDA and ?has been authorized for detection and/or diagnosis of SARS-CoV-2 by ?FDA under  an Emergency Use Authorization (EUA).  This EUA will remain ?in effect (meaning this test can be used) for the duration of the ?COVID-19 declaration under Section 564(b)(1) of the Act, 21 U.S.C. ?section 360bbb-3(b)(1), unless the authorization is terminated or ?revoked sooner. ? ?Performed at [facility] [address] ?[place], [place] [address] ?  ? WBC 09/28/2021 9.3  4.0 - 10.5 K/uL Final  ? RBC 09/28/2021 3.15 (L)  3.87 - 5.11 MIL/uL Final  ? Hemoglobin 09/28/2021 10.1 (L)  12.0 - 15.0 g/dL  Final  ? HCT 09/28/2021 31.6 (L)  36.0 - 46.0 % Final  ? MCV 09/28/2021 100.3 (H)  80.0 - 100.0 fL Final  ? MCH 09/28/2021 32.1  26.0 - 34.0 pg Final  ? MCHC 09/28/2021 32.0  30.0 - 36.0 g/dL Final  ? RDW 09/28/2021 12.6  11.5 - 15.5 % Final  ? Platelets 09/28/2021 197  150 - 400 K/uL Final  ? nRBC 09/28/2021 0.0  0.0 - 0.2 % Final  ? Performed at [facility], [place] [address] [place], [place] [address]  ? Sodium 09/28/2021 136  135 - 145 mmol/L Final  ? Potassium 09/28/2021 4.7  3.5 - 5.1 mmol/L Final  ? Chloride 09/28/2021 106  98 - 111 mmol/L Final  ? CO2 09/28/2021 26  22 - 32 mmol/L Final  ? Glucose, Bld 09/28/2021 99  70 - 99 mg/dL Final  ? Glucose reference range applies only to samples taken after fasting for at least 8 hours.  ? BUN 09/28/2021 18  8 - 23 mg/dL Final  ? Creatinine, Ser 09/28/2021 0.82  0.44 - 1.00 mg/dL Final  ? Calcium 09/28/2021 8.4 (L)  8.9 - 10.3 mg/dL Final  ? GFR, Estimated 09/28/2021 >60  >60 mL/min Final  ? Comment: (NOTE) ?Calculated using the CKD-EPI Creatinine Equation (2021) ?  ? Anion gap 09/28/2021 4 (L)  5 - 15 Final  ? Performed at [facility], [place] [address] [place], [place] [address]  ? WBC 09/29/2021 10.0  4.0 - 10.5 K/uL Final  ? RBC 09/29/2021 3.30 (L)  3.87 - 5.11 MIL/uL Final  ? Hemoglobin 09/29/2021 10.8 (L)  12.0 - 15.0 g/dL Final  ? HCT 09/29/2021 32.7 (L)  36.0 - 46.0 % Final  ? MCV 09/29/2021 99.1  80.0 - 100.0 fL Final  ? MCH 09/29/2021 32.7  26.0 - 34.0 pg Final  ? MCHC 09/29/2021 33.0  30.0 - 36.0 g/dL Final  ? RDW 09/29/2021 12.6  11.5 - 15.5 % Final  ? Platelets 09/29/2021 211  150 - 400 K/uL Final  ? nRBC 09/29/2021 0.0  0.0 - 0.2 % Final  ? Performed at [facility], [place] [address], [place], [place] [address]  ?Hospital Outpatient Visit on 09/16/2021  ?Component Date Value Ref Range Status  ? MRSA, PCR 09/16/2021 NEGATIVE  NEGATIVE Final  ? Staphylococcus aureus 09/16/2021 NEGATIVE  NEGATIVE Final   ? Comment: (NOTE) ?The Xpert SA Assay (FDA approved for NASAL specimens in patients 71 ?years of age and older), is one component of a comprehensive ?surveillance program. It is not intended to diagnose infection nor to ?guide or monitor treatment. ?Performed at [facility], [address] ?[place], [place] [address] ?  ? WBC 09/16/2021 6.5  4.0 - 10.5 K/uL Final  ? RBC 09/16/2021 3.95  3.87 - 5.11 MIL/uL Final  ? Hemoglobin 09/16/2021 12.7  12.0 - 15.0 g/dL Final  ? HCT 09/16/2021 39.1  36.0 - 46.0 % Final  ? MCV 09/16/2021 99.0  80.0 - 100.0 fL Final  ? MCH 09/16/2021 32.2  26.0 - 34.0 pg Final  ? MCHC 09/16/2021 32.5  30.0 - 36.0 g/dL Final  ? RDW 09/16/2021 12.8  11.5 - 15.5 % Final  ? Platelets 09/16/2021 284  150 - 400 K/uL Final  ? nRBC 09/16/2021 0.0  0.0 - 0.2 % Final  ? Performed at [facility], [place] [address] [place], [place] [address]  ? Sodium 09/16/2021 135  135 - 145 mmol/L Final  ? Potassium 09/16/2021 4.6  3.5 - 5.1 mmol/L Final  ? Chloride 09/16/2021 101  98 - 111 mmol/L Final  ? CO2 09/16/2021 28  22 - 32 mmol/L Final  ? Glucose, Bld 09/16/2021 80  70 - 99 mg/dL Final  ? Glucose reference range applies only to samples taken after fasting for at least 8 hours.  ? BUN 09/16/2021 22  8 - 23 mg/dL Final  ? Creatinine, Ser 09/16/2021 0.82  0.44 - 1.00 mg/dL Final  ? Calcium 09/16/2021 9.7  8.9 - 10.3 mg/dL Final  ? Total Protein 09/16/2021 7.9  6.5 - 8.1 g/dL Final  ? Albumin 09/16/2021 4.6  3.5 - 5.0 g/dL Final  ? AST 09/16/2021 24  15 - 41 U/L Final  ? ALT 09/16/2021 11  0 - 44 U/L Final  ? Alkaline Phosphatase 09/16/2021 77  38 - 126 U/L Final  ? Total Bilirubin 09/16/2021 0.5  0.3 - 1.2 mg/dL Final  ? GFR, Estimated 09/16/2021 >60  >60 mL/min Final  ? Comment: (NOTE) ?Calculated using the CKD-EPI Creatinine Equation (2021) ?  ? Anion gap 09/16/2021 6  5 - 15 Final  ? Performed at [facility], [place] [address], [place], [place] [address]   ? ABO/RH(D) 09/16/2021 A NEG   Final  ? Antibody Screen 09/16/2021 NEG   Final  ? Sample Expiration 09/16/2021 09/30/2021,2359   Final  ? Extend sample reason 09/16/2021    Final  ?                 Value:NO TRANSFUS

## 2021-10-14 DIAGNOSIS — M25562 Pain in left knee: Secondary | ICD-10-CM | POA: Diagnosis not present

## 2021-10-14 DIAGNOSIS — M1712 Unilateral primary osteoarthritis, left knee: Secondary | ICD-10-CM | POA: Diagnosis not present

## 2021-10-14 DIAGNOSIS — Z96652 Presence of left artificial knee joint: Secondary | ICD-10-CM | POA: Diagnosis not present

## 2021-10-17 DIAGNOSIS — M1712 Unilateral primary osteoarthritis, left knee: Secondary | ICD-10-CM | POA: Diagnosis not present

## 2021-10-17 DIAGNOSIS — M25562 Pain in left knee: Secondary | ICD-10-CM | POA: Diagnosis not present

## 2021-10-17 DIAGNOSIS — Z96652 Presence of left artificial knee joint: Secondary | ICD-10-CM | POA: Diagnosis not present

## 2021-10-19 DIAGNOSIS — M1712 Unilateral primary osteoarthritis, left knee: Secondary | ICD-10-CM | POA: Diagnosis not present

## 2021-10-19 DIAGNOSIS — Z96652 Presence of left artificial knee joint: Secondary | ICD-10-CM | POA: Diagnosis not present

## 2021-10-19 DIAGNOSIS — M25562 Pain in left knee: Secondary | ICD-10-CM | POA: Diagnosis not present

## 2021-10-26 DIAGNOSIS — M25562 Pain in left knee: Secondary | ICD-10-CM | POA: Diagnosis not present

## 2021-10-26 DIAGNOSIS — M1712 Unilateral primary osteoarthritis, left knee: Secondary | ICD-10-CM | POA: Diagnosis not present

## 2021-10-26 DIAGNOSIS — Z96652 Presence of left artificial knee joint: Secondary | ICD-10-CM | POA: Diagnosis not present

## 2021-10-31 DIAGNOSIS — M25562 Pain in left knee: Secondary | ICD-10-CM | POA: Diagnosis not present

## 2021-10-31 DIAGNOSIS — M1712 Unilateral primary osteoarthritis, left knee: Secondary | ICD-10-CM | POA: Diagnosis not present

## 2021-10-31 DIAGNOSIS — Z96652 Presence of left artificial knee joint: Secondary | ICD-10-CM | POA: Diagnosis not present

## 2021-11-02 DIAGNOSIS — M25562 Pain in left knee: Secondary | ICD-10-CM | POA: Diagnosis not present

## 2021-11-02 DIAGNOSIS — M1712 Unilateral primary osteoarthritis, left knee: Secondary | ICD-10-CM | POA: Diagnosis not present

## 2021-11-02 DIAGNOSIS — Z96652 Presence of left artificial knee joint: Secondary | ICD-10-CM | POA: Diagnosis not present

## 2021-11-07 DIAGNOSIS — Z96652 Presence of left artificial knee joint: Secondary | ICD-10-CM | POA: Diagnosis not present

## 2021-11-07 DIAGNOSIS — M25562 Pain in left knee: Secondary | ICD-10-CM | POA: Diagnosis not present

## 2021-11-07 DIAGNOSIS — M1712 Unilateral primary osteoarthritis, left knee: Secondary | ICD-10-CM | POA: Diagnosis not present

## 2021-11-09 DIAGNOSIS — M25562 Pain in left knee: Secondary | ICD-10-CM | POA: Diagnosis not present

## 2021-11-09 DIAGNOSIS — Z96652 Presence of left artificial knee joint: Secondary | ICD-10-CM | POA: Diagnosis not present

## 2021-11-09 DIAGNOSIS — M1712 Unilateral primary osteoarthritis, left knee: Secondary | ICD-10-CM | POA: Diagnosis not present

## 2021-11-14 DIAGNOSIS — M25562 Pain in left knee: Secondary | ICD-10-CM | POA: Diagnosis not present

## 2021-11-14 DIAGNOSIS — Z96652 Presence of left artificial knee joint: Secondary | ICD-10-CM | POA: Diagnosis not present

## 2021-11-14 DIAGNOSIS — M1712 Unilateral primary osteoarthritis, left knee: Secondary | ICD-10-CM | POA: Diagnosis not present

## 2021-11-16 DIAGNOSIS — Z4789 Encounter for other orthopedic aftercare: Secondary | ICD-10-CM | POA: Diagnosis not present

## 2021-11-21 DIAGNOSIS — M1712 Unilateral primary osteoarthritis, left knee: Secondary | ICD-10-CM | POA: Diagnosis not present

## 2021-11-21 DIAGNOSIS — Z96652 Presence of left artificial knee joint: Secondary | ICD-10-CM | POA: Diagnosis not present

## 2021-11-21 DIAGNOSIS — M25562 Pain in left knee: Secondary | ICD-10-CM | POA: Diagnosis not present

## 2021-11-23 DIAGNOSIS — M25562 Pain in left knee: Secondary | ICD-10-CM | POA: Diagnosis not present

## 2021-11-23 DIAGNOSIS — M1712 Unilateral primary osteoarthritis, left knee: Secondary | ICD-10-CM | POA: Diagnosis not present

## 2021-11-23 DIAGNOSIS — Z96652 Presence of left artificial knee joint: Secondary | ICD-10-CM | POA: Diagnosis not present

## 2022-02-07 ENCOUNTER — Ambulatory Visit: Payer: Medicare Other | Admitting: Licensed Clinical Social Worker

## 2022-02-07 ENCOUNTER — Ambulatory Visit: Payer: Self-pay | Admitting: Licensed Clinical Social Worker

## 2022-02-07 NOTE — Patient Outreach (Signed)
  Care Coordination  Initial Visit Note   02/07/2022 Name: Isabella Valencia MRN: [id number] DOB: [birth year]  Isabella Valencia is a 81 y.o. year old female who sees Burns, Claudina Lick, MD for primary care. I spoke with  Toshika E Kaseman by phone today  What matters to the patients health and wellness today?  Patient reports no concerns or needs with health and wellness related to physical or mental heath. .   Recommendation: Patient may benefit from, and is in agreement to To schedule AWV when office calls to schedule .    Goals Addressed             This Visit's Progress    COMPLETED: Care Coordination Activities / No Follow up Required       Care Coordination Interventions: Made referral to office to schedule AWV          SDOH assessments and interventions completed:  Yes  SDOH Interventions Today    Flowsheet Row Most Recent Value  SDOH Interventions   Food Insecurity Interventions Intervention Not Indicated  Housing Interventions Intervention Not Indicated  Transportation Interventions Intervention Not Indicated        Care Coordination Interventions Activated:  Yes  Care Coordination Interventions:  Yes, provided   Follow up plan: No further intervention required. Referral made to get AWV shceduled    Encounter Outcome:  Pt. Visit Completed   Casimer Lanius, [place] [phone]

## 2022-02-07 NOTE — Patient Instructions (Signed)
Visit Information  Thank you for taking time to visit with me today. Please don't hesitate to contact me if I can be of assistance to you.   Following are the goals we discussed today:   Goals Addressed             This Visit's Progress    COMPLETED: Care Coordination Activities / No Follow up Required       Care Coordination Interventions: Made referral to office to schedule AWV         Please call the care guide team at [phone] if you need to cancel or reschedule your appointment.     The patient verbalized understanding of instructions, educational materials, and care plan provided today and DECLINED offer to receive copy of patient instructions, educational materials, and care plan.   No further follow up required: [place], [place] [phone]

## 2022-02-07 NOTE — Patient Outreach (Signed)
  Care Coordination   02/07/2022 Name: Isabella Valencia MRN: [id number] DOB: 17-Jun-1941   Care Coordination Outreach Attempts:  An unsuccessful telephone outreach was attempted today to offer the patient information about available care coordination services as a benefit of their health plan.   Follow Up Plan:  Additional outreach attempts will be made to offer the patient care coordination information and services.   Encounter Outcome:  No Answer  Care Coordination Interventions Activated:  No   Care Coordination Interventions:  No, not indicated    Casimer Lanius, [place] [phone]

## 2022-02-27 ENCOUNTER — Ambulatory Visit: Payer: Medicare Other

## 2022-03-07 DIAGNOSIS — H43813 Vitreous degeneration, bilateral: Secondary | ICD-10-CM | POA: Diagnosis not present

## 2022-03-07 DIAGNOSIS — H26492 Other secondary cataract, left eye: Secondary | ICD-10-CM | POA: Diagnosis not present

## 2022-03-07 DIAGNOSIS — Z961 Presence of intraocular lens: Secondary | ICD-10-CM | POA: Diagnosis not present

## 2022-03-07 DIAGNOSIS — H35371 Puckering of macula, right eye: Secondary | ICD-10-CM | POA: Diagnosis not present

## 2022-04-12 ENCOUNTER — Ambulatory Visit (INDEPENDENT_AMBULATORY_CARE_PROVIDER_SITE_OTHER): Payer: Medicare Other

## 2022-04-12 VITALS — BP 122/60 | HR 78 | Temp 97.4°F | Ht 64.0 in | Wt 163.2 lb

## 2022-04-12 DIAGNOSIS — Z0000 Encounter for general adult medical examination without abnormal findings: Secondary | ICD-10-CM

## 2022-04-12 DIAGNOSIS — Z23 Encounter for immunization: Secondary | ICD-10-CM

## 2022-04-12 NOTE — Progress Notes (Signed)
Subjective:   Isabella Valencia is a 81 y.o. female who presents for an Initial Medicare Annual Wellness Visit.  Review of Systems     Cardiac Risk Factors include: advanced age (>40mn, >>81women)     Objective:    Today's Vitals   04/12/22 1302  BP: 122/60  Pulse: 78  Temp: (!) 97.4 F (36.3 C)  SpO2: 96%  Weight: 163 lb 3.2 oz (74 kg)  Height: '5\' 4"'$  (1.626 m)  PainSc: 0-No pain   Body mass index is 28.01 kg/m.     04/12/2022    1:12 PM 09/27/2021    5:48 AM 09/16/2021    1:26 PM 10/25/2020    6:58 AM 12/10/2018    5:55 AM 12/03/2018    2:47 PM 02/26/2016   10:57 PM  Advanced Directives  Does Patient Have a Medical Advance Directive? Yes No Yes No Yes Yes Yes  Type of [place]  [place]  Living will Living will Living will;Healthcare Power of Attorney  Does patient want to make changes to medical advance directive?   No - Patient declined  No - Patient declined No - Patient declined No - Patient declined  Copy of [place]? No - copy requested No - copy requested No - copy requested    Yes  Would patient like information on creating a medical advance directive?  No - Patient declined  No - Patient declined       Current Medications (verified) Outpatient Encounter Medications as of 04/12/2022  Medication Sig   acetaminophen (TYLENOL) 325 MG tablet Take 3 tablets (975 mg total) by mouth every 6 (six) hours.   celecoxib (CELEBREX) 200 MG capsule Take 1 capsule (200 mg total) by mouth daily.   diclofenac sodium (VOLTAREN) 1 % GEL Apply 1 application. topically daily as needed (knee pain).   docusate sodium (COLACE) 100 MG capsule Take 1 capsule (100 mg total) by mouth 2 (two) times daily.   hydroxypropyl methylcellulose / hypromellose (ISOPTO TEARS / GONIOVISC) 2.5 % ophthalmic solution Place 1 drop into both eyes 3 (three) times daily as needed for dry eyes.    methocarbamol (ROBAXIN) 500 MG tablet Take 1 tablet (500 mg total) by mouth every 6 (six) hours as needed for muscle spasms.   Multiple Minerals-Vitamins (CALCIUM 600+D3 PLUS MINERALS) TABS Take 1 tablet by mouth in the morning.   Multiple Vitamin (MULTIVITAMIN WITH MINERALS) TABS tablet Take 1 tablet by mouth in the morning.   ondansetron (ZOFRAN) 4 MG tablet Take 1 tablet (4 mg total) by mouth every 6 (six) hours as needed for nausea.   oxyCODONE (OXY IR/ROXICODONE) 5 MG immediate release tablet Take 1-2 tablets (5-10 mg total) by mouth every 4 (four) hours as needed for severe pain.   polyethylene glycol (MIRALAX / GLYCOLAX) 17 g packet Take 17 g by mouth daily as needed for mild constipation.   psyllium (METAMUCIL SMOOTH TEXTURE) 28 % packet Take 1 packet by mouth in the morning.   TURMERIC PO Take 550 mg by mouth daily.   No facility-administered encounter medications on file as of 04/12/2022.    Allergies (verified) Triamcinolone acetonide, Morphine and related, and Adhesive [tape]   History: Past Medical History:  Diagnosis Date   Anemia    in her younger years (esp during pregnancy)   Arthritis 2004   neg ANA & RA   Asthma    [place] - upper arm    Cervical disc disease    Complication of anesthesia    reports had toruble waking up   Difficulty sleeping    due to knee pain   Heart murmur    mild   History of skin cancer    Hyperlipidemia    Loose bowel movements    gassy, onset after hip surgery   MVP (mitral valve prolapse)    reports this is the cause of her murmur. denies any cardiac sx related to  MVP . does not have a cardiologist . reporst she gets her annual check ups of her heart at her pcp office    Pneumonia    in her 44's   Past Surgical History:  Procedure Laterality Date   ABDOMINAL HYSTERECTOMY     AUGMENTATION MAMMAPLASTY     BREAST ENHANCEMENT SURGERY Bilateral    BREAST IMPLANT REMOVAL Bilateral 10/25/2020   Procedure: REMOVAL  BREAST IMPLANTS;  Surgeon: Wallace Going, DO;  Location: [place];  Service: Plastics;  Laterality: Bilateral;  90 min, please   CAPSULECTOMY Bilateral 10/25/2020   Procedure: CAPSULECTOMY;  Surgeon: Wallace Going, DO;  Location: [place];  Service: Plastics;  Laterality: Bilateral;   CATARACT EXTRACTION     COLONOSCOPY     every 5 years ; Dr Earlean Shawl   cosmetic eye surgery     ELBOW SURGERY      X3   KNEE ARTHROSCOPY     rt knee   TOTAL HIP ARTHROPLASTY Right 12/10/2018   Procedure: TOTAL HIP ARTHROPLASTY ANTERIOR APPROACH;  Surgeon: Paralee Cancel, MD;  Location: [facility];  Service: Orthopedics;  Laterality: Right;  70 mins   TOTAL KNEE ARTHROPLASTY Right 10/06/2014   Procedure: RIGHT TOTAL KNEE ARTHROPLASTY;  Surgeon: Paralee Cancel, MD;  Location: [facility];  Service: Orthopedics;  Laterality: Right;   TOTAL KNEE ARTHROPLASTY Left 09/27/2021   Procedure: TOTAL KNEE ARTHROPLASTY;  Surgeon: Paralee Cancel, MD;  Location: [facility];  Service: Orthopedics;  Laterality: Left;   TUBAL LIGATION     Family History  Problem Relation Age of Onset   Arthritis Mother    Colon cancer Mother    Stroke Father    Colon cancer Maternal Aunt    Colon cancer Maternal Grandmother    Colon polyps Brother    Social History   Socioeconomic History   Marital status: Divorced    Spouse name: Not on file   Number of children: Not on file   Years of education: Not on file   Highest education level: Not on file  Occupational History   Not on file  Tobacco Use   Smoking status: Never   Smokeless tobacco: Never  Vaping Use   Vaping Use: Never used  Substance and Sexual Activity   Alcohol use: Not Currently    Alcohol/week: 0.0 standard drinks of alcohol    Comment:  wine socially;  none since fall 2019   Drug use: No   Sexual activity: Not Currently  Other Topics Concern   Not on file  Social History Narrative   No regular exercise   Social Determinants of Health   Financial Resource Strain: Not on  file  Food Insecurity: No Food Insecurity (02/07/2022)   Hunger Vital Sign    Worried About Running Out of Food in the Last Year: Never true    Ran Out of Food in the Last Year: Never true  Transportation Needs: No Transportation Needs (02/07/2022)   [place] -  Hydrologist (Medical): No    Lack of Transportation (Non-Medical): No  Physical Activity: Not on file  Stress: Not on file  Social Connections: Not on file    Tobacco Counseling Counseling given: Not Answered   Clinical Intake:  Pre-visit preparation completed: Yes  Pain : No/denies pain Pain Score: 0-No pain     BMI - recorded: 28.01 Nutritional Status: BMI 25 -29 Overweight Nutritional Risks: None Diabetes: No  How often do you need to have someone help you when you read instructions, pamphlets, or other written materials from your doctor or pharmacy?: 1 - Never What is the last grade level you completed in school?: HSG  Diabetic? no  Interpreter Needed?: No  Information entered by :: Lisette Abu, LPN.   Activities of Daily Living    04/12/2022    1:24 PM 09/27/2021   11:27 AM  In your present state of health, do you have any difficulty performing the following activities:  Hearing? 0 0  Vision? 0 0  Difficulty concentrating or making decisions? 0 0  Walking or climbing stairs? 0 1  Dressing or bathing? 0 0  Doing errands, shopping? 0 0  Preparing Food and eating ? N   Using the Toilet? N   In the past six months, have you accidently leaked urine? N   Do you have problems with loss of bowel control? N   Managing your Medications? N   Managing your Finances? N   Housekeeping or managing your Housekeeping? N     Patient Care Team: Binnie Rail, MD as PCP - General (Internal Medicine)  Indicate any recent Medical Services you may have received from other than [facility] providers in the past year (date may be approximate).     Assessment:   This is a routine wellness  examination for Nahima.  Hearing/Vision screen Vision Screening   Right eye Left eye Both eyes  Without correction     With correction   20/20  Comments: Wears rx glasses - up to date with routine eye exams with Daryel Gerald, OD.   Hearing Screening - Comments:: Denies hearing difficulties     Dietary issues and exercise activities discussed: Current Exercise Habits: Home exercise routine, Type of exercise: walking, Time (Minutes): 30, Frequency (Times/Week): 5, Weekly Exercise (Minutes/Week): 150, Exercise limited by: orthopedic condition(s)   Goals Addressed   None   Depression Screen    07/25/2021    2:51 PM 07/07/2020   10:45 AM 11/22/2018    2:37 PM 02/18/2016    3:33 PM  PHQ 2/9 Scores  PHQ - 2 Score 0 0 0 0  PHQ- 9 Score 1       Fall Risk    04/12/2022    1:12 PM 07/25/2021    1:20 PM 07/07/2020   10:40 AM 11/22/2018    2:36 PM 02/18/2016    3:33 PM  Fall Risk   Falls in the past year? 0 0 0 0 No  Number falls in past yr: 0 0 0 0   Injury with Fall? 0 0 0    Risk for fall due to : No Fall Risks No Fall Risks No Fall Risks    Follow up Falls prevention discussed Falls evaluation completed Falls evaluation completed      [place]:  Any stairs in or around the home? Yes  If so, are there any without handrails? No  Home free of loose  throw rugs in walkways, pet beds, electrical cords, etc? Yes  Adequate lighting in your home to reduce risk of falls? Yes   ASSISTIVE DEVICES UTILIZED TO PREVENT FALLS:  Life alert? No  Use of a cane, walker or w/c? No  Grab bars in the bathroom? Yes  Shower chair or bench in shower? Yes  Elevated toilet seat or a handicapped toilet? Yes   TIMED UP AND GO:  Was the test performed? Yes .  Length of time to ambulate 10 feet: 6 sec.   Gait steady and fast without use of assistive device  Cognitive Function:        04/12/2022    1:24 PM  6CIT Screen  What Year? 0 points  What month? 0  points  What time? 0 points  Count back from 20 0 points  Months in reverse 0 points  Repeat phrase 0 points  Total Score 0 points    Immunizations Immunization History  Administered Date(s) Administered   Fluad Quad(high Dose 65+) 04/22/2019, 07/07/2020, 07/25/2021   Influenza Split 03/28/2012   Influenza, High Dose Seasonal PF 05/13/2014, 05/09/2016, 06/07/2018   Influenza,inj,quad, With Preservative 06/07/2018   Moderna Sars-Covid-2 Vaccination 09/10/2019, 10/08/2019   Pneumococcal Conjugate-13 02/18/2016   Pneumococcal Polysaccharide-23 11/22/2018    TDAP status: Due, Education has been provided regarding the importance of this vaccine. Advised may receive this vaccine at local pharmacy or Health Dept. Aware to provide a copy of the vaccination record if obtained from local pharmacy or Health Dept. Verbalized acceptance and understanding.  Flu Vaccine status: Completed at today's visit  Pneumococcal vaccine status: Up to date  Covid-19 vaccine status: Completed vaccines  Qualifies for Shingles Vaccine? Yes   Zostavax completed No   Shingrix Completed?: No.    Education has been provided regarding the importance of this vaccine. Patient has been advised to call insurance company to determine out of pocket expense if they have not yet received this vaccine. Advised may also receive vaccine at local pharmacy or Health Dept. Verbalized acceptance and understanding.  Screening Tests Health Maintenance  Topic Date Due   TETANUS/TDAP  Never done   Zoster Vaccines- Shingrix (1 of 2) Never done   DEXA SCAN  Never done   COVID-19 Vaccine (3 - Moderna series) 12/03/2019   INFLUENZA VACCINE  01/31/2022   Pneumonia Vaccine 32+ Years old  Completed   HPV VACCINES  Aged Out    Health Maintenance  Health Maintenance Due  Topic Date Due   TETANUS/TDAP  Never done   Zoster Vaccines- Shingrix (1 of 2) Never done   DEXA SCAN  Never done   COVID-19 Vaccine (3 - Moderna series)  12/03/2019   INFLUENZA VACCINE  01/31/2022    Colorectal cancer screening: No longer required.   Mammogram status: Completed 09/08/2020. Repeat every year  Bone density status: never done  Lung Cancer Screening: (Low Dose CT Chest recommended if Age 63-80 years, 30 pack-year currently smoking OR have quit w/in 15years.) does not qualify.   Lung Cancer Screening Referral: no  Additional Screening:  Hepatitis C Screening: does not qualify; Completed no  Vision Screening: Recommended annual ophthalmology exams for early detection of glaucoma and other disorders of the eye. Is the patient up to date with their annual eye exam?  Yes  Who is the provider or what is the name of the office in which the patient attends annual eye exams? Daryel Gerald, OD. If pt is not established with a provider, would they like  to be referred to a provider to establish care? No .   Dental Screening: Recommended annual dental exams for proper oral hygiene  Community Resource Referral / Chronic Care Management: CRR required this visit?  No   CCM required this visit?  No      Plan:     I have personally reviewed and noted the following in the patient's chart:   Medical and social history Use of alcohol, tobacco or illicit drugs  Current medications and supplements including opioid prescriptions. Patient is currently taking opioid prescriptions. Information provided to patient regarding non-opioid alternatives. Patient advised to discuss non-opioid treatment plan with their provider. Functional ability and status Nutritional status Physical activity Advanced directives List of other physicians Hospitalizations, surgeries, and ER visits in previous 12 months Vitals Screenings to include cognitive, depression, and falls Referrals and appointments  In addition, I have reviewed and discussed with patient certain preventive protocols, quality metrics, and best practice recommendations. A written  personalized care plan for preventive services as well as general preventive health recommendations were provided to patient.     Sheral Flow, LPN   [id number]   Nurse Notes: N/A

## 2022-04-12 NOTE — Patient Instructions (Addendum)
Isabella Valencia , Thank you for taking time to come for your Medicare Wellness Visit. I appreciate your ongoing commitment to your health goals. Please review the following plan we discussed and let me know if I can assist you in the future.   These are the goals we discussed:  Goals      My goal is to maintain my health and eat healthy.        This is a list of the screening recommended for you and due dates:  Health Maintenance  Topic Date Due   Tetanus Vaccine  Never done   Zoster (Shingles) Vaccine (1 of 2) Never done   DEXA scan (bone density measurement)  Never done   COVID-19 Vaccine (3 - Moderna series) 12/03/2019   Pneumonia Vaccine  Completed   Flu Shot  Completed   HPV Vaccine  Aged Out    Advanced directives: Yes  Conditions/risks identified: Yes  Next appointment: Follow up in one year for your annual wellness visit.   Preventive Care 17 Years and Older, Female Preventive care refers to lifestyle choices and visits with your health care provider that can promote health and wellness. What does preventive care include? A yearly physical exam. This is also called an annual well check. Dental exams once or twice a year. Routine eye exams. Ask your health care provider how often you should have your eyes checked. Personal lifestyle choices, including: Daily care of your teeth and gums. Regular physical activity. Eating a healthy diet. Avoiding tobacco and drug use. Limiting alcohol use. Practicing safe sex. Taking low-dose aspirin every day. Taking vitamin and mineral supplements as recommended by your health care provider. What happens during an annual well check? The services and screenings done by your health care provider during your annual well check will depend on your age, overall health, lifestyle risk factors, and family history of disease. Counseling  Your health care provider may ask you questions about your: Alcohol use. Tobacco use. Drug use. Emotional  well-being. Home and relationship well-being. Sexual activity. Eating habits. History of falls. Memory and ability to understand (cognition). Work and work Statistician. Reproductive health. Screening  You may have the following tests or measurements: Height, weight, and BMI. Blood pressure. Lipid and cholesterol levels. These may be checked every 5 years, or more frequently if you are over 19 years old. Skin check. Lung cancer screening. You may have this screening every year starting at age 45 if you have a 30-pack-year history of smoking and currently smoke or have quit within the past 15 years. Fecal occult blood test (FOBT) of the stool. You may have this test every year starting at age 69. Flexible sigmoidoscopy or colonoscopy. You may have a sigmoidoscopy every 5 years or a colonoscopy every 10 years starting at age 66. Hepatitis C blood test. Hepatitis B blood test. Sexually transmitted disease (STD) testing. Diabetes screening. This is done by checking your blood sugar (glucose) after you have not eaten for a while (fasting). You may have this done every 1-3 years. Bone density scan. This is done to screen for osteoporosis. You may have this done starting at age 39. Mammogram. This may be done every 1-2 years. Talk to your health care provider about how often you should have regular mammograms. Talk with your health care provider about your test results, treatment options, and if necessary, the need for more tests. Vaccines  Your health care provider may recommend certain vaccines, such as: Influenza vaccine. This is recommended  every year. Tetanus, diphtheria, and acellular pertussis (Tdap, Td) vaccine. You may need a Td booster every 10 years. Zoster vaccine. You may need this after age 51. Pneumococcal 13-valent conjugate (PCV13) vaccine. One dose is recommended after age 27. Pneumococcal polysaccharide (PPSV23) vaccine. One dose is recommended after age 33. Talk to your  health care provider about which screenings and vaccines you need and how often you need them. This information is not intended to replace advice given to you by your health care provider. Make sure you discuss any questions you have with your health care provider. Document Released: 07/16/2015 Document Revised: 03/08/2016 Document Reviewed: 04/20/2015 Elsevier Interactive Patient Education  2017 [employer] Prevention in the Home Falls can cause injuries. They can happen to people of all ages. There are many things you can do to make your home safe and to help prevent falls. What can I do on the outside of my home? Regularly fix the edges of walkways and driveways and fix any cracks. Remove anything that might make you trip as you walk through a door, such as a raised step or threshold. Trim any bushes or trees on the path to your home. Use bright outdoor lighting. Clear any walking paths of anything that might make someone trip, such as rocks or tools. Regularly check to see if handrails are loose or broken. Make sure that both sides of any steps have handrails. Any raised decks and porches should have guardrails on the edges. Have any leaves, snow, or ice cleared regularly. Use sand or salt on walking paths during winter. Clean up any spills in your garage right away. This includes oil or grease spills. What can I do in the bathroom? Use night lights. Install grab bars by the toilet and in the tub and shower. Do not use towel bars as grab bars. Use non-skid mats or decals in the tub or shower. If you need to sit down in the shower, use a plastic, non-slip stool. Keep the floor dry. Clean up any water that spills on the floor as soon as it happens. Remove soap buildup in the tub or shower regularly. Attach bath mats securely with double-sided non-slip rug tape. Do not have throw rugs and other things on the floor that can make you trip. What can I do in the bedroom? Use night  lights. Make sure that you have a light by your bed that is easy to reach. Do not use any sheets or blankets that are too big for your bed. They should not hang down onto the floor. Have a firm chair that has side arms. You can use this for support while you get dressed. Do not have throw rugs and other things on the floor that can make you trip. What can I do in the kitchen? Clean up any spills right away. Avoid walking on wet floors. Keep items that you use a lot in easy-to-reach places. If you need to reach something above you, use a strong step stool that has a grab bar. Keep electrical cords out of the way. Do not use floor polish or wax that makes floors slippery. If you must use wax, use non-skid floor wax. Do not have throw rugs and other things on the floor that can make you trip. What can I do with my stairs? Do not leave any items on the stairs. Make sure that there are handrails on both sides of the stairs and use them. Fix handrails that are broken  or loose. Make sure that handrails are as long as the stairways. Check any carpeting to make sure that it is firmly attached to the stairs. Fix any carpet that is loose or worn. Avoid having throw rugs at the top or bottom of the stairs. If you do have throw rugs, attach them to the floor with carpet tape. Make sure that you have a light switch at the top of the stairs and the bottom of the stairs. If you do not have them, ask someone to add them for you. What else can I do to help prevent falls? Wear shoes that: Do not have high heels. Have rubber bottoms. Are comfortable and fit you well. Are closed at the toe. Do not wear sandals. If you use a stepladder: Make sure that it is fully opened. Do not climb a closed stepladder. Make sure that both sides of the stepladder are locked into place. Ask someone to hold it for you, if possible. Clearly mark and make sure that you can see: Any grab bars or handrails. First and last  steps. Where the edge of each step is. Use tools that help you move around (mobility aids) if they are needed. These include: Canes. Walkers. Scooters. Crutches. Turn on the lights when you go into a dark area. Replace any light bulbs as soon as they burn out. Set up your furniture so you have a clear path. Avoid moving your furniture around. If any of your floors are uneven, fix them. If there are any pets around you, be aware of where they are. Review your medicines with your doctor. Some medicines can make you feel dizzy. This can increase your chance of falling. Ask your doctor what other things that you can do to help prevent falls. This information is not intended to replace advice given to you by your health care provider. Make sure you discuss any questions you have with your health care provider. Document Released: 04/15/2009 Document Revised: 11/25/2015 Document Reviewed: 07/24/2014 Elsevier Interactive Patient Education  2017 [employer].

## 2022-07-10 ENCOUNTER — Telehealth: Payer: Self-pay

## 2022-07-10 NOTE — Telephone Encounter (Signed)
Pts daughter has asked if Dr. Quay Burow will take her on as a new pt.   Pts daughter information is Jenniger Figiel, DOB: 07/30/1958 and her TEL: [phone].

## 2022-09-12 DIAGNOSIS — H43813 Vitreous degeneration, bilateral: Secondary | ICD-10-CM | POA: Diagnosis not present

## 2022-09-12 DIAGNOSIS — H1789 Other corneal scars and opacities: Secondary | ICD-10-CM | POA: Diagnosis not present

## 2022-09-12 DIAGNOSIS — Z961 Presence of intraocular lens: Secondary | ICD-10-CM | POA: Diagnosis not present

## 2022-09-12 DIAGNOSIS — H26492 Other secondary cataract, left eye: Secondary | ICD-10-CM | POA: Diagnosis not present

## 2022-09-12 DIAGNOSIS — H35371 Puckering of macula, right eye: Secondary | ICD-10-CM | POA: Diagnosis not present

## 2023-03-18 ENCOUNTER — Encounter: Payer: Self-pay | Admitting: Internal Medicine

## 2023-03-18 NOTE — Progress Notes (Unsigned)
Subjective:    Patient ID: Isabella Valencia, female    DOB: 1941/03/15, 82 y.o.   MRN: [id number]      HPI Isabella Valencia is here for a Physical exam and her chronic medical problems.   She fell two days ago - she was coming down the stairs and miss two steps.  She went flying forward.  She hit her right side of her face, right elbow.  She had nose bleeding. She did call EMS and they evaluated her.  She still has a headache.  She tenderness on the right side of her face.  The back of her head hurts.  Has some blurriness in the right eye - this is new.  Pain below eye.  She sees her eye doctor in 2 days.   Left achilles pain.  Has been applying Voltaren gel which seems to be helping.  Medications and allergies reviewed with patient and updated if appropriate.  Current Outpatient Medications on File Prior to Visit  Medication Sig Dispense Refill   acetaminophen (TYLENOL) 325 MG tablet Take 3 tablets (975 mg total) by mouth every 6 (six) hours.     diclofenac sodium (VOLTAREN) 1 % GEL Apply 1 application. topically daily as needed (knee pain).     docusate sodium (COLACE) 100 MG capsule Take 1 capsule (100 mg total) by mouth 2 (two) times daily. 10 capsule 0   hydroxypropyl methylcellulose / hypromellose (ISOPTO TEARS / GONIOVISC) 2.5 % ophthalmic solution Place 1 drop into both eyes 3 (three) times daily as needed for dry eyes.     Multiple Minerals-Vitamins (CALCIUM 600+D3 PLUS MINERALS) TABS Take 1 tablet by mouth in the morning.     Multiple Vitamin (MULTIVITAMIN WITH MINERALS) TABS tablet Take 1 tablet by mouth in the morning.     polyethylene glycol (MIRALAX / GLYCOLAX) 17 g packet Take 17 g by mouth daily as needed for mild constipation. 14 each 0   psyllium (METAMUCIL SMOOTH TEXTURE) 28 % packet Take 1 packet by mouth in the morning.     TURMERIC PO Take 550 mg by mouth daily.     No current facility-administered medications on file prior to visit.    Review of Systems   Constitutional:  Negative for fever.  HENT:  Positive for hearing loss (L > R).   Eyes:  Positive for visual disturbance.  Respiratory:  Negative for cough, shortness of breath and wheezing.   Cardiovascular:  Negative for chest pain, palpitations and leg swelling.  Gastrointestinal:  Negative for abdominal pain, blood in stool, constipation and diarrhea.       No gerd  Genitourinary:  Negative for dysuria.  Musculoskeletal:  Positive for arthralgias. Negative for back pain.  Skin:  Negative for rash.  Neurological:  Positive for headaches (right posterior headache from recent). Negative for light-headedness.  Psychiatric/Behavioral:  Negative for dysphoric mood. The patient is not nervous/anxious.        Objective:   Vitals:   03/19/23 1422  BP: 106/74  Pulse: 72  Temp: 97.9 F (36.6 C)  SpO2: 98%   There were no vitals filed for this visit. Body mass index is 28.01 kg/m.  BP Readings from Last 3 Encounters:  03/19/23 106/74  04/12/22 122/60  09/29/21 116/72    Wt Readings from Last 3 Encounters:  04/12/22 163 lb 3.2 oz (74 kg)  09/27/21 163 lb (73.9 kg)  09/16/21 163 lb (73.9 kg)       Physical Exam Constitutional: She appears  well-developed and well-nourished. No distress.  HENT:  Head: Normocephalic and atraumatic.  Right Ear: External ear normal. Normal ear canal and TM Left Ear: External ear normal.  Normal ear canal and TM Mouth/Throat: Oropharynx is clear and moist.  Eyes: Conjunctivae normal.  Neck: Neck supple. No tracheal deviation present. No thyromegaly present.  No carotid bruit  Cardiovascular: Normal rate, regular rhythm and normal heart sounds.   No murmur heard.  No edema. Pulmonary/Chest: Effort normal and breath sounds normal. No respiratory distress. She has no wheezes. She has no rales.  Breast: deferred   Abdominal: Soft. She exhibits no distension. There is no tenderness.  Lymphadenopathy: She has no cervical adenopathy.  Skin: Skin  is warm and dry. She is not diaphoretic.  Some mild bruising around her right orbit and right side of nasal bridge.  Bruising right elbow Psychiatric: She has a normal mood and affect. Her behavior is normal.     Lab Results  Component Value Date   WBC 10.0 09/29/2021   HGB 10.8 (L) 09/29/2021   HCT 32.7 (L) 09/29/2021   PLT 211 09/29/2021   GLUCOSE 99 09/28/2021   CHOL 207 (H) 02/28/2016   TRIG 77 02/28/2016   HDL 75 02/28/2016   LDLCALC 117 (H) 02/28/2016   ALT 11 09/16/2021   AST 24 09/16/2021   NA 136 09/28/2021   K 4.7 09/28/2021   CL 106 09/28/2021   CREATININE 0.82 09/28/2021   BUN 18 09/28/2021   CO2 26 09/28/2021   TSH 1.27 04/22/2019   INR 0.9 07/25/2021   HGBA1C 5.6 07/25/2021         Assessment & Plan:   Physical exam: Screening blood work  ordered Exercise  none Weight  is good Substance abuse  none   Reviewed recommended immunizations.   Health Maintenance  Topic Date Due   DTaP/Tdap/Td (1 - Tdap) Never done   Zoster Vaccines- Shingrix (1 of 2) Never done   DEXA SCAN  Never done   INFLUENZA VACCINE  02/01/2023   Medicare Annual Wellness (AWV)  04/13/2023   COVID-19 Vaccine (3 - 2023-24 season) 04/04/2023 (Originally 03/04/2023)   Pneumonia Vaccine 76+ Years old  Completed   HPV VACCINES  Aged Out          See Problem List for Assessment and Plan of chronic medical problems.

## 2023-03-18 NOTE — Patient Instructions (Addendum)
Flu immunization administered today.      Blood work was ordered.   The lab is on the first floor.    Medications changes include :   none     Return in about 1 year (around 03/18/2024) for Physical Exam.   Health Maintenance, Female Adopting a healthy lifestyle and getting preventive care are important in promoting health and wellness. Ask your health care provider about: The right schedule for you to have regular tests and exams. Things you can do on your own to prevent diseases and keep yourself healthy. What should I know about diet, weight, and exercise? Eat a healthy diet  Eat a diet that includes plenty of vegetables, fruits, low-fat dairy products, and lean protein. Do not eat a lot of foods that are high in solid fats, added sugars, or sodium. Maintain a healthy weight Body mass index (BMI) is used to identify weight problems. It estimates body fat based on height and weight. Your health care provider can help determine your BMI and help you achieve or maintain a healthy weight. Get regular exercise Get regular exercise. This is one of the most important things you can do for your health. Most adults should: Exercise for at least 150 minutes each week. The exercise should increase your heart rate and make you sweat (moderate-intensity exercise). Do strengthening exercises at least twice a week. This is in addition to the moderate-intensity exercise. Spend less time sitting. Even light physical activity can be beneficial. Watch cholesterol and blood lipids Have your blood tested for lipids and cholesterol at 82 years of age, then have this test every 5 years. Have your cholesterol levels checked more often if: Your lipid or cholesterol levels are high. You are older than 82 years of age. You are at high risk for heart disease. What should I know about cancer screening? Depending on your health history and family history, you may need to have cancer screening at  various ages. This may include screening for: Breast cancer. Cervical cancer. Colorectal cancer. Skin cancer. Lung cancer. What should I know about heart disease, diabetes, and high blood pressure? Blood pressure and heart disease High blood pressure causes heart disease and increases the risk of stroke. This is more likely to develop in people who have high blood pressure readings or are overweight. Have your blood pressure checked: Every 3-5 years if you are 33-23 years of age. Every year if you are 77 years old or older. Diabetes Have regular diabetes screenings. This checks your fasting blood sugar level. Have the screening done: Once every three years after age 61 if you are at a normal weight and have a low risk for diabetes. More often and at a younger age if you are overweight or have a high risk for diabetes. What should I know about preventing infection? Hepatitis B If you have a higher risk for hepatitis B, you should be screened for this virus. Talk with your health care provider to find out if you are at risk for hepatitis B infection. Hepatitis C Testing is recommended for: Everyone born from [birth year] through 1965. Anyone with known risk factors for hepatitis C. Sexually transmitted infections (STIs) Get screened for STIs, including gonorrhea and chlamydia, if: You are sexually active and are younger than 82 years of age. You are older than 82 years of age and your health care provider tells you that you are at risk for this type of infection. Your sexual activity has changed since you  were last screened, and you are at increased risk for chlamydia or gonorrhea. Ask your health care provider if you are at risk. Ask your health care provider about whether you are at high risk for HIV. Your health care provider may recommend a prescription medicine to help prevent HIV infection. If you choose to take medicine to prevent HIV, you should first get tested for HIV. You should then be  tested every 3 months for as long as you are taking the medicine. Pregnancy If you are about to stop having your period (premenopausal) and you may become pregnant, seek counseling before you get pregnant. Take 400 to 800 micrograms (mcg) of folic acid every day if you become pregnant. Ask for birth control (contraception) if you want to prevent pregnancy. Osteoporosis and menopause Osteoporosis is a disease in which the bones lose minerals and strength with aging. This can result in bone fractures. If you are 21 years old or older, or if you are at risk for osteoporosis and fractures, ask your health care provider if you should: Be screened for bone loss. Take a calcium or vitamin D supplement to lower your risk of fractures. Be given hormone replacement therapy (HRT) to treat symptoms of menopause. Follow these instructions at home: Alcohol use Do not drink alcohol if: Your health care provider tells you not to drink. You are pregnant, may be pregnant, or are planning to become pregnant. If you drink alcohol: Limit how much you have to: 0-1 drink a day. Know how much alcohol is in your drink. In the [place] one drink equals one 12 oz bottle of beer (355 mL), one 5 oz glass of wine (148 mL), or one 1 oz glass of hard liquor (44 mL). Lifestyle Do not use any products that contain nicotine or tobacco. These products include cigarettes, chewing tobacco, and vaping devices, such as e-cigarettes. If you need help quitting, ask your health care provider. Do not use street drugs. Do not share needles. Ask your health care provider for help if you need support or information about quitting drugs. General instructions Schedule regular health, dental, and eye exams. Stay current with your vaccines. Tell your health care provider if: You often feel depressed. You have ever been abused or do not feel safe at home. Summary Adopting a healthy lifestyle and getting preventive care are important in  promoting health and wellness. Follow your health care provider's instructions about healthy diet, exercising, and getting tested or screened for diseases. Follow your health care provider's instructions on monitoring your cholesterol and blood pressure. This information is not intended to replace advice given to you by your health care provider. Make sure you discuss any questions you have with your health care provider. Document Revised: 11/08/2020 Document Reviewed: 11/08/2020 Elsevier Patient Education  2024 [employer].

## 2023-03-19 ENCOUNTER — Ambulatory Visit (INDEPENDENT_AMBULATORY_CARE_PROVIDER_SITE_OTHER): Payer: Medicare Other | Admitting: Internal Medicine

## 2023-03-19 VITALS — BP 106/74 | HR 72 | Temp 97.9°F | Ht 64.0 in

## 2023-03-19 DIAGNOSIS — Z0000 Encounter for general adult medical examination without abnormal findings: Secondary | ICD-10-CM

## 2023-03-19 DIAGNOSIS — Y92009 Unspecified place in unspecified non-institutional (private) residence as the place of occurrence of the external cause: Secondary | ICD-10-CM | POA: Diagnosis not present

## 2023-03-19 DIAGNOSIS — W19XXXA Unspecified fall, initial encounter: Secondary | ICD-10-CM | POA: Diagnosis not present

## 2023-03-19 DIAGNOSIS — R739 Hyperglycemia, unspecified: Secondary | ICD-10-CM | POA: Diagnosis not present

## 2023-03-19 DIAGNOSIS — Z13820 Encounter for screening for osteoporosis: Secondary | ICD-10-CM

## 2023-03-19 DIAGNOSIS — E7849 Other hyperlipidemia: Secondary | ICD-10-CM

## 2023-03-19 LAB — CBC WITH DIFFERENTIAL/PLATELET
Basophils Absolute: 0.1 10*3/uL (ref 0.0–0.1)
Basophils Relative: 0.9 % (ref 0.0–3.0)
Eosinophils Absolute: 0.1 10*3/uL (ref 0.0–0.7)
Eosinophils Relative: 1.7 % (ref 0.0–5.0)
HCT: 39.6 % (ref 36.0–46.0)
Hemoglobin: 12.7 g/dL (ref 12.0–15.0)
Lymphocytes Relative: 17.8 % (ref 12.0–46.0)
Lymphs Abs: 1.4 10*3/uL (ref 0.7–4.0)
MCHC: 32.2 g/dL (ref 30.0–36.0)
MCV: 99 fl (ref 78.0–100.0)
Monocytes Absolute: 0.7 10*3/uL (ref 0.1–1.0)
Monocytes Relative: 9.5 % (ref 3.0–12.0)
Neutro Abs: 5.4 10*3/uL (ref 1.4–7.7)
Neutrophils Relative %: 70.1 % (ref 43.0–77.0)
Platelets: 285 10*3/uL (ref 150.0–400.0)
RBC: 4 Mil/uL (ref 3.87–5.11)
RDW: 14 % (ref 11.5–15.5)
WBC: 7.6 10*3/uL (ref 4.0–10.5)

## 2023-03-19 LAB — COMPREHENSIVE METABOLIC PANEL
ALT: 10 U/L (ref 0–35)
AST: 22 U/L (ref 0–37)
Albumin: 4.2 g/dL (ref 3.5–5.2)
Alkaline Phosphatase: 73 U/L (ref 39–117)
BUN: 17 mg/dL (ref 6–23)
CO2: 31 meq/L (ref 19–32)
Calcium: 9.8 mg/dL (ref 8.4–10.5)
Chloride: 101 meq/L (ref 96–112)
Creatinine, Ser: 1.2 mg/dL (ref 0.40–1.20)
GFR: 42.12 mL/min — ABNORMAL LOW (ref >60.00)
Glucose, Bld: 74 mg/dL (ref 70–99)
Potassium: 5 meq/L (ref 3.5–5.1)
Sodium: 139 meq/L (ref 135–145)
Total Bilirubin: 0.4 mg/dL (ref 0.2–1.2)
Total Protein: 7.2 g/dL (ref 6.0–8.3)

## 2023-03-19 LAB — HEMOGLOBIN A1C: Hgb A1c MFr Bld: 5.7 % (ref 4.6–6.5)

## 2023-03-19 LAB — LIPID PANEL
Cholesterol: 281 mg/dL — ABNORMAL HIGH (ref 0–200)
HDL: 75 mg/dL (ref >39.00)
LDL Cholesterol: 164 mg/dL — ABNORMAL HIGH (ref 0–99)
NonHDL: 206.24
Total CHOL/HDL Ratio: 4
Triglycerides: 209 mg/dL — ABNORMAL HIGH (ref 0.0–149.0)
VLDL: 41.8 mg/dL — ABNORMAL HIGH (ref 0.0–40.0)

## 2023-03-19 LAB — TSH: TSH: 2.08 u[IU]/mL (ref 0.35–5.50)

## 2023-03-19 NOTE — Assessment & Plan Note (Signed)
Acute 2 days ago she fell at home-missed the last 2 steps going down the stairs She did hit her head, right elbow and right face near her orbit EMS was called and she was evaluated No confusion, but mild headache and pain in right face where she landed Discussed possible imaging, but she is doing well and it has been 2 days so we will hold off for now She will see her eye doctor in 2 days, sooner if vision gets worse. Discussed fall prevention

## 2023-03-19 NOTE — Assessment & Plan Note (Addendum)
Chronic Check a1c Low sugar / carb diet

## 2023-03-19 NOTE — Assessment & Plan Note (Signed)
Chronic Check lipids, cmp, tsh, cbc Regular exercise, healthy diet encouraged Diet controlled

## 2023-03-21 DIAGNOSIS — H35371 Puckering of macula, right eye: Secondary | ICD-10-CM | POA: Diagnosis not present

## 2023-03-21 DIAGNOSIS — Z961 Presence of intraocular lens: Secondary | ICD-10-CM | POA: Diagnosis not present

## 2023-03-21 DIAGNOSIS — H1789 Other corneal scars and opacities: Secondary | ICD-10-CM | POA: Diagnosis not present

## 2023-03-21 DIAGNOSIS — H524 Presbyopia: Secondary | ICD-10-CM | POA: Diagnosis not present

## 2023-03-21 DIAGNOSIS — H43813 Vitreous degeneration, bilateral: Secondary | ICD-10-CM | POA: Diagnosis not present

## 2023-03-28 ENCOUNTER — Inpatient Hospital Stay: Admission: RE | Admit: 2023-03-28 | Payer: Medicare Other | Source: Ambulatory Visit

## 2023-05-17 ENCOUNTER — Ambulatory Visit: Payer: Medicare Other | Admitting: Internal Medicine

## 2023-05-17 ENCOUNTER — Encounter: Payer: Self-pay | Admitting: Internal Medicine

## 2023-05-17 VITALS — BP 122/82 | HR 90 | Temp 97.8°F | Resp 16 | Ht 64.0 in | Wt 160.2 lb

## 2023-05-17 DIAGNOSIS — Z23 Encounter for immunization: Secondary | ICD-10-CM

## 2023-05-17 DIAGNOSIS — S61411A Laceration without foreign body of right hand, initial encounter: Secondary | ICD-10-CM | POA: Diagnosis not present

## 2023-05-17 NOTE — Patient Instructions (Signed)

## 2023-05-17 NOTE — Progress Notes (Signed)
Subjective:  Patient ID: Isabella Valencia, female    DOB: 05/21/1941  Age: 82 y.o. MRN: [id number]  CC: Hand Injury (PT had undone umbrella and had impaled right hand with one of the metal prongs from the umbrella. No itching, notes of pain. Did not see rusting on the umbrella)   HPI Isabella Valencia presents for right hand injury ---    Discussed the use of AI scribe software for clinical note transcription with the patient, who gave verbal consent to proceed.  History of Present Illness   The patient presented with a right-hand injury sustained earlier in the day, around noon, due to an incident involving a large umbrella. The injury resulted in a skin tear, which the patient described as severe due to the thinness of her skin. The patient expressed concern about the wound and was open to the possibility of stitches to aid in healing. The patient was unsure of the date of her last tetanus shot.         Outpatient Medications Prior to Visit  Medication Sig Dispense Refill   acetaminophen (TYLENOL) 325 MG tablet Take 3 tablets (975 mg total) by mouth every 6 (six) hours.     diclofenac sodium (VOLTAREN) 1 % GEL Apply 1 application. topically daily as needed (knee pain).     hydroxypropyl methylcellulose / hypromellose (ISOPTO TEARS / GONIOVISC) 2.5 % ophthalmic solution Place 1 drop into both eyes 3 (three) times daily as needed for dry eyes.     Multiple Minerals-Vitamins (CALCIUM 600+D3 PLUS MINERALS) TABS Take 1 tablet by mouth in the morning.     Multiple Vitamin (MULTIVITAMIN WITH MINERALS) TABS tablet Take 1 tablet by mouth in the morning.     TURMERIC PO Take 550 mg by mouth daily.     docusate sodium (COLACE) 100 MG capsule Take 1 capsule (100 mg total) by mouth 2 (two) times daily. (Patient not taking: Reported on 05/17/2023) 10 capsule 0   polyethylene glycol (MIRALAX / GLYCOLAX) 17 g packet Take 17 g by mouth daily as needed for mild constipation. (Patient not taking: Reported on  05/17/2023) 14 each 0   psyllium (METAMUCIL SMOOTH TEXTURE) 28 % packet Take 1 packet by mouth in the morning. (Patient not taking: Reported on 05/17/2023)     No facility-administered medications prior to visit.    ROS Review of Systems  Objective:  BP 122/82   Pulse 90   Temp 97.8 F (36.6 C) (Oral)   Resp 16   Ht 5\' 4"  (1.626 m)   Wt 160 lb 3.2 oz (72.7 kg)   SpO2 98%   BMI 27.50 kg/m   BP Readings from Last 3 Encounters:  05/17/23 122/82  03/19/23 106/74  04/12/22 122/60    Wt Readings from Last 3 Encounters:  05/17/23 160 lb 3.2 oz (72.7 kg)  04/12/22 163 lb 3.2 oz (74 kg)  09/27/21 163 lb (73.9 kg)    Physical Exam Vitals reviewed.  Cardiovascular:     Rate and Rhythm: Normal rate and regular rhythm.  Pulmonary:     Effort: Pulmonary effort is normal.     Breath sounds: No stridor. No wheezing, rhonchi or rales.  Abdominal:     General: Abdomen is flat.     Palpations: There is no mass.     Tenderness: There is no abdominal tenderness. There is no guarding.     Hernia: No hernia is present.  Musculoskeletal:        General: Deformity  and signs of injury present. No swelling or tenderness.     Right hand: Deformity and laceration present. No swelling, tenderness or bony tenderness. Normal range of motion. Normal strength.     Left hand: Normal.     Cervical back: Neck supple.     Comments: Dorsum right hand ---  2.6 CM laceration with associated ecchymosis but no FB, TTP, swelling.  Skin:    General: Skin is warm.     Findings: Bruising present.  Neurological:     General: No focal deficit present.     Mental Status: She is alert.  Psychiatric:        Mood and Affect: Mood normal.     Lab Results  Component Value Date   WBC 7.6 03/19/2023   HGB 12.7 03/19/2023   HCT 39.6 03/19/2023   PLT 285.0 03/19/2023   GLUCOSE 74 03/19/2023   CHOL 281 (H) 03/19/2023   TRIG 209.0 (H) 03/19/2023   HDL 75.00 03/19/2023   LDLCALC 164 (H) 03/19/2023   ALT  10 03/19/2023   AST 22 03/19/2023   NA 139 03/19/2023   K 5.0 03/19/2023   CL 101 03/19/2023   CREATININE 1.20 03/19/2023   BUN 17 03/19/2023   CO2 31 03/19/2023   TSH 2.08 03/19/2023   INR 0.9 07/25/2021   HGBA1C 5.7 03/19/2023    After informed verbal consent was obtained the area of laceration on the right hand was cleaned with Betadine and prepped and draped in sterile fashion.  Local anesthesia was obtained with the instillation of 2 cc of 2% lidocaine with epi.  3 simple interrupted sutures were placed using 4-0 nylon on a PC 3.  The closure affect is good.  She tolerated this well.  Neosporin and a dressing were applied.  Assessment & Plan:  Flu vaccine need -     Flu Vaccine Trivalent High Dose (Fluad)  Laceration of right hand without foreign body, initial encounter- Repaired with 3 sutures. -     Td vaccine greater than or equal to 7yo preservative free IM     Follow-up: Return in about 8 days (around 05/25/2023).  Sanda Linger, MD

## 2023-05-25 ENCOUNTER — Ambulatory Visit (INDEPENDENT_AMBULATORY_CARE_PROVIDER_SITE_OTHER): Payer: Medicare Other | Admitting: Internal Medicine

## 2023-05-25 ENCOUNTER — Encounter: Payer: Self-pay | Admitting: Internal Medicine

## 2023-05-25 VITALS — BP 126/86 | HR 77 | Temp 97.7°F | Resp 16 | Ht 64.0 in | Wt 156.8 lb

## 2023-05-25 DIAGNOSIS — Z5189 Encounter for other specified aftercare: Secondary | ICD-10-CM | POA: Diagnosis not present

## 2023-05-25 NOTE — Patient Instructions (Signed)

## 2023-05-25 NOTE — Progress Notes (Signed)
Subjective:  Patient ID: Isabella Valencia, female    DOB: [birth year]  Age: 82 y.o. MRN: [id number]  CC: Wound Check   HPI Isabella Valencia presents for f/up ----  Discussed the use of AI scribe software for clinical note transcription with the patient, who gave verbal consent to proceed.  History of Present Illness   The patient presents with a recent wound, which is currently sutured. She reports no redness, pain, or drainage from the wound site, and believes it to be healing well. Despite initial concerns about the thinness of the skin around the wound, the patient has not experienced any complications. She denies any associated pain or swelling in the hand or wrist, and has not required any analgesics. Systemically, she denies fevers, chills, or night sweats. Neurologically, she reports no numbness, weakness, or tingling.       Outpatient Medications Prior to Visit  Medication Sig Dispense Refill   acetaminophen (TYLENOL) 325 MG tablet Take 3 tablets (975 mg total) by mouth every 6 (six) hours.     diclofenac sodium (VOLTAREN) 1 % GEL Apply 1 application. topically daily as needed (knee pain).     hydroxypropyl methylcellulose / hypromellose (ISOPTO TEARS / GONIOVISC) 2.5 % ophthalmic solution Place 1 drop into both eyes 3 (three) times daily as needed for dry eyes.     Multiple Minerals-Vitamins (CALCIUM 600+D3 PLUS MINERALS) TABS Take 1 tablet by mouth in the morning.     Multiple Vitamin (MULTIVITAMIN WITH MINERALS) TABS tablet Take 1 tablet by mouth in the morning.     docusate sodium (COLACE) 100 MG capsule Take 1 capsule (100 mg total) by mouth 2 (two) times daily. (Patient not taking: Reported on 05/17/2023) 10 capsule 0   polyethylene glycol (MIRALAX / GLYCOLAX) 17 g packet Take 17 g by mouth daily as needed for mild constipation. (Patient not taking: Reported on 05/17/2023) 14 each 0   psyllium (METAMUCIL SMOOTH TEXTURE) 28 % packet Take 1 packet by mouth in the morning. (Patient not  taking: Reported on 05/17/2023)     TURMERIC PO Take 550 mg by mouth daily.     No facility-administered medications prior to visit.    ROS Review of Systems  All other systems reviewed and are negative.   Objective:  BP 126/86 (BP Location: Left Arm, Patient Position: Sitting, Cuff Size: Normal)   Pulse 77   Temp 97.7 F (36.5 C) (Oral)   Resp 16   Ht 5\' 4"  (1.626 m)   Wt 156 lb 12.8 oz (71.1 kg)   SpO2 95%   BMI 26.91 kg/m   BP Readings from Last 3 Encounters:  05/25/23 126/86  05/17/23 122/82  03/19/23 106/74    Wt Readings from Last 3 Encounters:  05/25/23 156 lb 12.8 oz (71.1 kg)  05/17/23 160 lb 3.2 oz (72.7 kg)  04/12/22 163 lb 3.2 oz (74 kg)    Physical Exam Vitals reviewed.  HENT:     Mouth/Throat:     Mouth: Mucous membranes are moist.  Eyes:     General: No scleral icterus.    Conjunctiva/sclera: Conjunctivae normal.  Cardiovascular:     Rate and Rhythm: Normal rate and regular rhythm.     Heart sounds: No murmur heard. Pulmonary:     Effort: Pulmonary effort is normal.     Breath sounds: No stridor. No wheezing, rhonchi or rales.  Abdominal:     General: Abdomen is flat.     Palpations: There is no mass.  Tenderness: There is no abdominal tenderness. There is no guarding.     Hernia: No hernia is present.  Musculoskeletal:        General: Normal range of motion.     Cervical back: Neck supple.     Right lower leg: No edema.     Left lower leg: No edema.  Skin:    General: Skin is warm and dry.  Psychiatric:        Mood and Affect: Mood normal.        Behavior: Behavior normal.     Lab Results  Component Value Date   WBC 7.6 03/19/2023   HGB 12.7 03/19/2023   HCT 39.6 03/19/2023   PLT 285.0 03/19/2023   GLUCOSE 74 03/19/2023   CHOL 281 (H) 03/19/2023   TRIG 209.0 (H) 03/19/2023   HDL 75.00 03/19/2023   LDLCALC 164 (H) 03/19/2023   ALT 10 03/19/2023   AST 22 03/19/2023   NA 139 03/19/2023   K 5.0 03/19/2023   CL 101  03/19/2023   CREATININE 1.20 03/19/2023   BUN 17 03/19/2023   CO2 31 03/19/2023   TSH 2.08 03/19/2023   INR 0.9 07/25/2021   HGBA1C 5.7 03/19/2023    No results found.  Assessment & Plan:  Encounter for wound re-check     Follow-up: Return if symptoms worsen or fail to improve.  Sanda Linger, MD

## 2023-07-11 ENCOUNTER — Ambulatory Visit: Payer: Medicare Other

## 2023-07-11 VITALS — Ht 64.0 in | Wt 156.0 lb

## 2023-07-11 DIAGNOSIS — Z0000 Encounter for general adult medical examination without abnormal findings: Secondary | ICD-10-CM | POA: Diagnosis not present

## 2023-07-11 NOTE — Patient Instructions (Signed)
 Isabella Valencia , Thank you for taking time to come for your Medicare Wellness Visit. I appreciate your ongoing commitment to your health goals. Please review the following plan we discussed and let me know if I can assist you in the future.   Referrals/Orders/Follow-Ups/Clinician Recommendations: You are due for Shingles vaccine and also a Bone Density.  It was nice talking to you today and Happy early Birthday.  Keep up the good work.  This is a list of the screening recommended for you and due dates:  Health Maintenance  Topic Date Due   DEXA scan (bone density measurement)  Never done   COVID-19 Vaccine (3 - 2024-25 season) 07/27/2023*   Zoster (Shingles) Vaccine (1 of 2) 10/31/2023*   Medicare Annual Wellness Visit  07/10/2024   DTaP/Tdap/Td vaccine (2 - Tdap) 05/16/2033   Pneumonia Vaccine  Completed   Flu Shot  Completed   HPV Vaccine  Aged Out  *Topic was postponed. The date shown is not the original due date.    Advanced directives: (Copy Requested) Please bring a copy of your health care power of attorney and living will to the office to be added to your chart at your convenience.  Next Medicare Annual Wellness Visit scheduled for next year: Yes

## 2023-07-11 NOTE — Progress Notes (Signed)
 Subjective:   Isabella Valencia is a 83 y.o. female who presents for Medicare Annual (Subsequent) preventive examination.  Visit Complete: Virtual I connected with  Isabella Valencia on 07/11/23 by a audio enabled telemedicine application and verified that I am speaking with the correct person using two identifiers.  Patient Location: Home  Provider Location: Office/Clinic  I discussed the limitations of evaluation and management by telemedicine. The patient expressed understanding and agreed to proceed.  Vital Signs: Because this visit was a virtual/telehealth visit, some criteria may be missing or patient reported. Any vitals not documented were not able to be obtained and vitals that have been documented are patient reported.   Cardiac Risk Factors include: advanced age (>95men, >74 women);dyslipidemia     Objective:    Today's Vitals   07/11/23 1342  Weight: 156 lb (70.8 kg)  Height: 5' 4 (1.626 m)   Body mass index is 26.78 kg/m.     07/11/2023    1:54 PM 04/12/2022    1:12 PM 09/27/2021    5:48 AM 09/16/2021    1:26 PM 10/25/2020    6:58 AM 12/10/2018    5:55 AM 12/03/2018    2:47 PM  Advanced Directives  Does Patient Have a Medical Advance Directive? Yes Yes No Yes No Yes Yes  Type of Estate Agent of [place];Living will Healthcare Power of [place];Living will  Healthcare Power of [place];Living will  Living will Living will  Does patient want to make changes to medical advance directive?    No - Patient declined  No - Patient declined No - Patient declined  Copy of Healthcare Power of Attorney in Chart? No - copy requested No - copy requested No - copy requested No - copy requested     Would patient like information on creating a medical advance directive?   No - Patient declined  No - Patient declined      Current Medications (verified) Outpatient Encounter Medications as of 07/11/2023  Medication Sig   acetaminophen  (TYLENOL ) 325 MG tablet Take 3  tablets (975 mg total) by mouth every 6 (six) hours.   diclofenac sodium (VOLTAREN) 1 % GEL Apply 1 application. topically daily as needed (knee pain).   hydroxypropyl methylcellulose / hypromellose (ISOPTO TEARS / GONIOVISC) 2.5 % ophthalmic solution Place 1 drop into both eyes 3 (three) times daily as needed for dry eyes.   Multiple Minerals-Vitamins (CALCIUM  600+D3 PLUS MINERALS) TABS Take 1 tablet by mouth in the morning.   Multiple Vitamin (MULTIVITAMIN WITH MINERALS) TABS tablet Take 1 tablet by mouth in the morning.   No facility-administered encounter medications on file as of 07/11/2023.    Allergies (verified) Triamcinolone acetonide, Morphine  and codeine, and Adhesive [tape]   History: Past Medical History:  Diagnosis Date   Anemia    in her younger years (esp during pregnancy)   Arthritis 2004   neg ANA & RA   Asthma    Cancer (HCC)    melanoma - upper arm    Cervical disc disease    Complication of anesthesia    reports had toruble waking up   Difficulty sleeping    due to knee pain   Heart murmur    mild   History of skin cancer    Hyperlipidemia    Loose bowel movements    gassy, onset after hip surgery   MVP (mitral valve prolapse)    reports this is the cause of her murmur. denies any cardiac sx  related to  MVP . does not have a cardiologist . reporst she gets her annual check ups of her heart at her pcp office    Pneumonia    in her 55's   Past Surgical History:  Procedure Laterality Date   ABDOMINAL HYSTERECTOMY     AUGMENTATION MAMMAPLASTY     BREAST ENHANCEMENT SURGERY Bilateral    BREAST IMPLANT REMOVAL Bilateral 10/25/2020   Procedure: REMOVAL BREAST IMPLANTS;  Surgeon: Lowery Estefana RAMAN, DO;  Location: [facility] OR;  Service: Plastics;  Laterality: Bilateral;  90 min, please   CAPSULECTOMY Bilateral 10/25/2020   Procedure: CAPSULECTOMY;  Surgeon: Lowery Estefana RAMAN, DO;  Location: [facility] OR;  Service: Plastics;  Laterality: Bilateral;   CATARACT EXTRACTION      COLONOSCOPY     every 5 years ; Dr Luis   cosmetic eye surgery     ELBOW SURGERY      X3   KNEE ARTHROSCOPY     rt knee   TOTAL HIP ARTHROPLASTY Right 12/10/2018   Procedure: TOTAL HIP ARTHROPLASTY ANTERIOR APPROACH;  Surgeon: Ernie Cough, MD;  Location: [facility];  Service: Orthopedics;  Laterality: Right;  70 mins   TOTAL KNEE ARTHROPLASTY Right 10/06/2014   Procedure: RIGHT TOTAL KNEE ARTHROPLASTY;  Surgeon: Cough Ernie, MD;  Location: [facility];  Service: Orthopedics;  Laterality: Right;   TOTAL KNEE ARTHROPLASTY Left 09/27/2021   Procedure: TOTAL KNEE ARTHROPLASTY;  Surgeon: Ernie Cough, MD;  Location: [facility];  Service: Orthopedics;  Laterality: Left;   TUBAL LIGATION     Family History  Problem Relation Age of Onset   Arthritis Mother    Colon cancer Mother    Stroke Father    Colon cancer Maternal Aunt    Colon cancer Maternal Grandmother    Colon polyps Brother    Social History   Socioeconomic History   Marital status: Divorced    Spouse name: Not on file   Number of children: Not on file   Years of education: Not on file   Highest education level: Not on file  Occupational History   Occupation: RETIRED  Tobacco Use   Smoking status: Never   Smokeless tobacco: Never  Vaping Use   Vaping status: Never Used  Substance and Sexual Activity   Alcohol  use: Not Currently    Alcohol /week: 0.0 standard drinks of alcohol     Comment:  wine socially;  none since fall 2019   Drug use: No   Sexual activity: Not Currently  Other Topics Concern   Not on file  Social History Narrative   No regular exercise   Lives with a female friend.   Social Drivers of Corporate Investment Banker Strain: Low Risk  (07/11/2023)   Overall Financial Resource Strain (CARDIA)    Difficulty of Paying Living Expenses: Not hard at all  Food Insecurity: No Food Insecurity (07/11/2023)   Hunger Vital Sign    Worried About Running Out of Food in the Last Year: Never true    Ran Out of Food in  the Last Year: Never true  Transportation Needs: No Transportation Needs (07/11/2023)   PRAPARE - Administrator, Civil Service (Medical): No    Lack of Transportation (Non-Medical): No  Physical Activity: Inactive (07/11/2023)   Exercise Vital Sign    Days of Exercise per Week: 0 days    Minutes of Exercise per Session: 0 min  Stress: No Stress Concern Present (07/11/2023)   [employer] of Occupational Health -  Occupational Stress Questionnaire    Feeling of Stress : Not at all  Social Connections: Socially Isolated (07/11/2023)   Social Connection and Isolation Panel [NHANES]    Frequency of Communication with Friends and Family: More than three times a week    Frequency of Social Gatherings with Friends and Family: Once a week    Attends Religious Services: Never    Database Administrator or Organizations: No    Attends Engineer, Structural: Never    Marital Status: Divorced    Tobacco Counseling Counseling given: Not Answered   Clinical Intake:  Pre-visit preparation completed: Yes  Pain : No/denies pain     BMI - recorded: 26.78 Nutritional Status: BMI 25 -29 Overweight Nutritional Risks: None Diabetes: No  How often do you need to have someone help you when you read instructions, pamphlets, or other written materials from your doctor or pharmacy?: 1 - Never  Interpreter Needed?: No  Information entered by :: Sevag Shearn, RMA   Activities of Daily Living    07/11/2023    1:49 PM  In your present state of health, do you have any difficulty performing the following activities:  Hearing? 1  Comment some hearing loss in lt ear  Vision? 0  Difficulty concentrating or making decisions? 0  Walking or climbing stairs? 0  Dressing or bathing? 0  Doing errands, shopping? 0  Preparing Food and eating ? N  Using the Toilet? N  In the past six months, have you accidently leaked urine? N  Do you have problems with loss of bowel control? N   Managing your Medications? N  Managing your Finances? N  Housekeeping or managing your Housekeeping? N    Patient Care Team: Geofm Glade PARAS, MD as PCP - General (Internal Medicine)  Indicate any recent Medical Services you may have received from other than [facility] providers in the past year (date may be approximate).     Assessment:   This is a routine wellness examination for Isabella Valencia.  Hearing/Vision screen Hearing Screening - Comments:: Some issues with lt ear Vision Screening - Comments:: Denies vision issues.    Goals Addressed             This Visit's Progress    My goal is to maintain my health and eat healthy.   On track     Depression Screen    07/11/2023    2:03 PM 04/17/2022    8:38 AM 07/25/2021    2:51 PM 07/07/2020   10:45 AM 11/22/2018    2:37 PM 02/18/2016    3:33 PM  PHQ 2/9 Scores  PHQ - 2 Score 0 0 0 0 0 0  PHQ- 9 Score 0  1       Fall Risk    07/11/2023    1:55 PM 04/12/2022    1:12 PM 07/25/2021    1:20 PM 07/07/2020   10:40 AM 11/22/2018    2:36 PM  Fall Risk   Falls in the past year? 0 0 0 0 0  Number falls in past yr:  0 0 0 0  Injury with Fall?  0 0 0   Risk for fall due to :  No Fall Risks No Fall Risks No Fall Risks   Follow up Falls evaluation completed;Falls prevention discussed Falls prevention discussed Falls evaluation completed Falls evaluation completed     MEDICARE RISK AT HOME: Medicare Risk at Home Any stairs in or around the home?: Yes If so,  are there any without handrails?: Yes Home free of loose throw rugs in walkways, pet beds, electrical cords, etc?: Yes Adequate lighting in your home to reduce risk of falls?: Yes Life alert?: No Use of a cane, walker or w/c?: No Grab bars in the bathroom?: Yes Shower chair or bench in shower?: Yes Elevated toilet seat or a handicapped toilet?: Yes  TIMED UP AND GO:  Was the test performed?  No    Cognitive Function:        07/11/2023    1:43 PM 04/12/2022    1:24 PM  6CIT  Screen  What Year? 0 points 0 points  What month? 0 points 0 points  What time? 0 points 0 points  Count back from 20 0 points 0 points  Months in reverse 0 points 0 points  Repeat phrase 2 points 0 points  Total Score 2 points 0 points    Immunizations Immunization History  Administered Date(s) Administered   Fluad Quad(high Dose 65+) 04/22/2019, 07/07/2020, 07/25/2021, 04/12/2022   Fluad Trivalent(High Dose 65+) 05/17/2023   Influenza Split 03/28/2012   Influenza, High Dose Seasonal PF 05/13/2014, 05/09/2016, 06/07/2018   Influenza,inj,quad, With Preservative 06/07/2018   Moderna Sars-Covid-2 Vaccination 09/10/2019, 10/08/2019   Pneumococcal Conjugate-13 02/18/2016   Pneumococcal Polysaccharide-23 11/22/2018   Td 05/17/2023    TDAP status: Up to date  Flu Vaccine status: Up to date  Pneumococcal vaccine status: Up to date  Covid-19 vaccine status: Declined, Education has been provided regarding the importance of this vaccine but patient still declined. Advised may receive this vaccine at local pharmacy or Health Dept.or vaccine clinic. Aware to provide a copy of the vaccination record if obtained from local pharmacy or Health Dept. Verbalized acceptance and understanding.  Qualifies for Shingles Vaccine? Yes   Zostavax completed No   Shingrix Completed?: No.    Education has been provided regarding the importance of this vaccine. Patient has been advised to call insurance company to determine out of pocket expense if they have not yet received this vaccine. Advised may also receive vaccine at local pharmacy or Health Dept. Verbalized acceptance and understanding.  Screening Tests Health Maintenance  Topic Date Due   DEXA SCAN  Never done   COVID-19 Vaccine (3 - 2024-25 season) 07/27/2023 (Originally 03/04/2023)   Zoster Vaccines- Shingrix (1 of 2) 10/31/2023 (Originally 07/27/1990)   Medicare Annual Wellness (AWV)  07/10/2024   DTaP/Tdap/Td (2 - Tdap) 05/16/2033   Pneumonia  Vaccine 29+ Years old  Completed   INFLUENZA VACCINE  Completed   HPV VACCINES  Aged Out    Health Maintenance  Health Maintenance Due  Topic Date Due   DEXA SCAN  Never done    Colorectal cancer screening: No longer required.   Mammogram status: No longer required due to age.  Bone Density status: Ordered 03/19/2023. Pt provided with contact info and advised to call to schedule appt.  Lung Cancer Screening: (Low Dose CT Chest recommended if Age 45-80 years, 20 pack-year currently smoking OR have quit w/in 15years.) does not qualify.   Lung Cancer Screening Referral:   Additional Screening:  Hepatitis C Screening: does not qualify;  Vision Screening: Recommended annual ophthalmology exams for early detection of glaucoma and other disorders of the eye. Is the patient up to date with their annual eye exam?  Yes  Who is the provider or what is the name of the office in which the patient attends annual eye exams? Dr. Crisoforo office (Dr. Kennyth) If pt is  not established with a provider, would they like to be referred to a provider to establish care? No .   Dental Screening: Recommended annual dental exams for proper oral hygiene  Community Resource Referral / Chronic Care Management: CRR required this visit?  No   CCM required this visit?  No     Plan:     I have personally reviewed and noted the following in the patient's chart:   Medical and social history Use of alcohol , tobacco or illicit drugs  Current medications and supplements including opioid prescriptions. Patient is not currently taking opioid prescriptions. Functional ability and status Nutritional status Physical activity Advanced directives List of other physicians Hospitalizations, surgeries, and ER visits in previous 12 months Vitals Screenings to include cognitive, depression, and falls Referrals and appointments  In addition, I have reviewed and discussed with patient certain preventive  protocols, quality metrics, and best practice recommendations. A written personalized care plan for preventive services as well as general preventive health recommendations were provided to patient.     Morgan Keinath L Andreas Sobolewski, CMA   07/11/2023   After Visit Summary: (Mail) Due to this being a telephonic visit, the after visit summary with patients personalized plan was offered to patient via mail   Nurse Notes: Patient is due for a Shingrix vaccine.  She is due for a DEXA and has one ordered but will wait until the weather before she schedule's the screening.  She had no concerns to address today.

## 2023-08-08 ENCOUNTER — Ambulatory Visit: Payer: Medicare Other | Admitting: Internal Medicine

## 2023-08-08 ENCOUNTER — Encounter: Payer: Self-pay | Admitting: Internal Medicine

## 2023-08-08 VITALS — BP 124/70 | HR 60 | Temp 97.7°F | Ht 64.0 in | Wt 158.0 lb

## 2023-08-08 DIAGNOSIS — R251 Tremor, unspecified: Secondary | ICD-10-CM | POA: Diagnosis not present

## 2023-08-08 DIAGNOSIS — H6122 Impacted cerumen, left ear: Secondary | ICD-10-CM | POA: Diagnosis not present

## 2023-08-08 DIAGNOSIS — R0981 Nasal congestion: Secondary | ICD-10-CM

## 2023-08-08 MED ORDER — FLUTICASONE PROPIONATE 50 MCG/ACT NA SUSP: 2.0000 | Freq: Every day | NASAL | 6 refills | Status: AC

## 2023-08-08 NOTE — Assessment & Plan Note (Signed)
 New Started to have a tremor - esp with writing and eating Her mom had this as well Likely essential tremor  Monitor only

## 2023-08-08 NOTE — Assessment & Plan Note (Addendum)
 

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
## 2023-08-08 NOTE — Patient Instructions (Addendum)
       Medications changes include :   flonase  nasal spray, also use saline spray a few times a day.  Try breath right strips for the nose    A referral was ordered [facility] and someone will call you to schedule an appointment.

## 2023-08-08 NOTE — Assessment & Plan Note (Signed)
 Acute Near impaction of left ear Her pain that she had recently and decreased hearing was likely related to impaction and then applying those eardrops may have decreased the wax just enough so that her symptoms improved Would recommend ENT remove the wax-referral ordered

## 2023-09-19 DIAGNOSIS — H35371 Puckering of macula, right eye: Secondary | ICD-10-CM | POA: Diagnosis not present

## 2023-09-19 DIAGNOSIS — H1789 Other corneal scars and opacities: Secondary | ICD-10-CM | POA: Diagnosis not present

## 2023-09-19 DIAGNOSIS — H26492 Other secondary cataract, left eye: Secondary | ICD-10-CM | POA: Diagnosis not present

## 2024-02-14 ENCOUNTER — Ambulatory Visit: Admitting: Internal Medicine

## 2024-03-04 ENCOUNTER — Ambulatory Visit (INDEPENDENT_AMBULATORY_CARE_PROVIDER_SITE_OTHER): Admitting: Internal Medicine

## 2024-03-04 ENCOUNTER — Encounter: Payer: Self-pay | Admitting: Internal Medicine

## 2024-03-04 VITALS — BP 120/74 | HR 60 | Temp 98.0°F | Ht 64.0 in | Wt 156.0 lb

## 2024-03-04 DIAGNOSIS — L989 Disorder of the skin and subcutaneous tissue, unspecified: Secondary | ICD-10-CM | POA: Diagnosis not present

## 2024-03-04 NOTE — Progress Notes (Signed)
    Subjective:    Patient ID: Isabella Valencia, female    DOB: March 17, 1941, 83 y.o.   MRN: [id number]      HPI Isabella Valencia is here for  Chief Complaint  Patient presents with   Spots on face    Has a rough place on her left cheek - it has been there for months.    Has a few spots on b/l anterior lower legs - itchy, keep recurring - present for months.    Doing well overall.     Medications and allergies reviewed with patient and updated if appropriate.  Current Outpatient Medications on File Prior to Visit  Medication Sig Dispense Refill   acetaminophen  (TYLENOL ) 325 MG tablet Take 3 tablets (975 mg total) by mouth every 6 (six) hours.     diclofenac sodium (VOLTAREN) 1 % GEL Apply 1 application. topically daily as needed (knee pain).     fluticasone  (FLONASE ) 50 MCG/ACT nasal spray Place 2 sprays into both nostrils daily. 16 g 6   hydroxypropyl methylcellulose / hypromellose (ISOPTO TEARS / GONIOVISC) 2.5 % ophthalmic solution Place 1 drop into both eyes 3 (three) times daily as needed for dry eyes.     Multiple Minerals-Vitamins (CALCIUM  600+D3 PLUS MINERALS) TABS Take 1 tablet by mouth in the morning.     Multiple Vitamin (MULTIVITAMIN WITH MINERALS) TABS tablet Take 1 tablet by mouth in the morning.     No current facility-administered medications on file prior to visit.    Review of Systems     Objective:   Vitals:   03/04/24 1330  BP: 120/74  Pulse: 60  Temp: 98 F (36.7 C)  SpO2: 99%   BP Readings from Last 3 Encounters:  03/04/24 120/74  08/08/23 124/70  05/25/23 126/86   Wt Readings from Last 3 Encounters:  03/04/24 156 lb (70.8 kg)  08/08/23 158 lb (71.7 kg)  07/11/23 156 lb (70.8 kg)   Body mass index is 26.78 kg/m.    Physical Exam Constitutional:      General: She is not in acute distress.    Appearance: Normal appearance. She is not ill-appearing.  HENT:     Head: Normocephalic and atraumatic.  Skin:    General: Skin is warm and dry.      Comments: Small dry patch on left upper cheek - presents for months. A few spots on b/l legs - not healing - presents for months  Neurological:     Mental Status: She is alert.  Psychiatric:        Judgment: Judgment normal.            Assessment & Plan:    See Problem List for Assessment and Plan of chronic medical problems.

## 2024-03-04 NOTE — Patient Instructions (Addendum)
     A referral was ordered [facility] and someone will call you to schedule an appointment.

## 2024-03-04 NOTE — Assessment & Plan Note (Signed)
 Subacute Present for months One dry patch on left cheek - present for month - concern for skin cancer A few lesion on b/l lower legs - ? Precancer Referral to dermatology

## 2024-03-18 ENCOUNTER — Encounter: Payer: Self-pay | Admitting: Internal Medicine

## 2024-03-18 DIAGNOSIS — L820 Inflamed seborrheic keratosis: Secondary | ICD-10-CM | POA: Diagnosis not present

## 2024-03-18 DIAGNOSIS — D225 Melanocytic nevi of trunk: Secondary | ICD-10-CM | POA: Diagnosis not present

## 2024-03-18 DIAGNOSIS — R944 Abnormal results of kidney function studies: Secondary | ICD-10-CM | POA: Insufficient documentation

## 2024-03-18 DIAGNOSIS — D692 Other nonthrombocytopenic purpura: Secondary | ICD-10-CM | POA: Diagnosis not present

## 2024-03-18 DIAGNOSIS — L308 Other specified dermatitis: Secondary | ICD-10-CM | POA: Diagnosis not present

## 2024-03-18 DIAGNOSIS — L821 Other seborrheic keratosis: Secondary | ICD-10-CM | POA: Diagnosis not present

## 2024-03-18 DIAGNOSIS — L570 Actinic keratosis: Secondary | ICD-10-CM | POA: Diagnosis not present

## 2024-03-18 DIAGNOSIS — L814 Other melanin hyperpigmentation: Secondary | ICD-10-CM | POA: Diagnosis not present

## 2024-03-18 DIAGNOSIS — D1801 Hemangioma of skin and subcutaneous tissue: Secondary | ICD-10-CM | POA: Diagnosis not present

## 2024-03-18 DIAGNOSIS — Z85828 Personal history of other malignant neoplasm of skin: Secondary | ICD-10-CM | POA: Diagnosis not present

## 2024-03-18 DIAGNOSIS — D2261 Melanocytic nevi of right upper limb, including shoulder: Secondary | ICD-10-CM | POA: Diagnosis not present

## 2024-03-18 NOTE — Progress Notes (Signed)
 Subjective:    Patient ID: Isabella Valencia, female    DOB: 1941-05-04, 83 y.o.   MRN: [id number]      HPI Isabella Valencia is here for a Physical exam and her chronic medical problems.      Medications and allergies reviewed with patient and updated if appropriate.  Current Outpatient Medications on File Prior to Visit  Medication Sig Dispense Refill  . acetaminophen  (TYLENOL ) 325 MG tablet Take 3 tablets (975 mg total) by mouth every 6 (six) hours.    . diclofenac sodium (VOLTAREN) 1 % GEL Apply 1 application. topically daily as needed (knee pain).    . fluticasone  (FLONASE ) 50 MCG/ACT nasal spray Place 2 sprays into both nostrils daily. 16 g 6  . hydroxypropyl methylcellulose / hypromellose (ISOPTO TEARS / GONIOVISC) 2.5 % ophthalmic solution Place 1 drop into both eyes 3 (three) times daily as needed for dry eyes.    . Multiple Minerals-Vitamins (CALCIUM  600+D3 PLUS MINERALS) TABS Take 1 tablet by mouth in the morning.    . Multiple Vitamin (MULTIVITAMIN WITH MINERALS) TABS tablet Take 1 tablet by mouth in the morning.     No current facility-administered medications on file prior to visit.    Review of Systems     Objective:  There were no vitals filed for this visit. There were no vitals filed for this visit. There is no height or weight on file to calculate BMI.  BP Readings from Last 3 Encounters:  03/04/24 120/74  08/08/23 124/70  05/25/23 126/86    Wt Readings from Last 3 Encounters:  03/04/24 156 lb (70.8 kg)  08/08/23 158 lb (71.7 kg)  07/11/23 156 lb (70.8 kg)       Physical Exam Constitutional: She appears well-developed and well-nourished. No distress.  HENT:  Head: Normocephalic and atraumatic.  Right Ear: External ear normal. Normal ear canal and TM Left Ear: External ear normal.  Normal ear canal and TM Mouth/Throat: Oropharynx is clear and moist.  Eyes: Conjunctivae normal.  Neck: Neck supple. No tracheal deviation present. No thyromegaly present.   No carotid bruit  Cardiovascular: Normal rate, regular rhythm and normal heart sounds.   No murmur heard.  No edema. Pulmonary/Chest: Effort normal and breath sounds normal. No respiratory distress. She has no wheezes. She has no rales.  Breast: deferred   Abdominal: Soft. She exhibits no distension. There is no tenderness.  Lymphadenopathy: She has no cervical adenopathy.  Skin: Skin is warm and dry. She is not diaphoretic.  Psychiatric: She has a normal mood and affect. Her behavior is normal.     Lab Results  Component Value Date   WBC 7.6 03/19/2023   HGB 12.7 03/19/2023   HCT 39.6 03/19/2023   PLT 285.0 03/19/2023   GLUCOSE 74 03/19/2023   CHOL 281 (H) 03/19/2023   TRIG 209.0 (H) 03/19/2023   HDL 75.00 03/19/2023   LDLCALC 164 (H) 03/19/2023   ALT 10 03/19/2023   AST 22 03/19/2023   NA 139 03/19/2023   K 5.0 03/19/2023   CL 101 03/19/2023   CREATININE 1.20 03/19/2023   BUN 17 03/19/2023   CO2 31 03/19/2023   TSH 2.08 03/19/2023   INR 0.9 07/25/2021   HGBA1C 5.7 03/19/2023         Assessment & Plan:   Physical exam: Screening blood work  ordered Exercise   Weight   Substance abuse  none   Reviewed recommended immunizations.   Health Maintenance  Topic Date Due  .  Zoster Vaccines- Shingrix (1 of 2) Never done  . DEXA SCAN  Never done  . COVID-19 Vaccine (3 - 2025-26 season) 03/03/2024  . Influenza Vaccine  09/30/2024 (Originally 02/01/2024)  . Medicare Annual Wellness (AWV)  07/10/2024  . DTaP/Tdap/Td (2 - Tdap) 05/16/2033  . Pneumococcal Vaccine: 50+ Years  Completed  . Meningococcal B Vaccine  Aged Out          See Problem List for Assessment and Plan of chronic medical problems.     This encounter was created in error - please disregard.

## 2024-03-18 NOTE — Patient Instructions (Addendum)
 Blood work was ordered.       Medications changes include :   None    A referral was ordered and someone will call you to schedule an appointment.     Return in about 1 year (around 03/19/2025) for Physical Exam.    Health Maintenance, Female Adopting a healthy lifestyle and getting preventive care are important in promoting health and wellness. Ask your health care provider about: The right schedule for you to have regular tests and exams. Things you can do on your own to prevent diseases and keep yourself healthy. What should I know about diet, weight, and exercise? Eat a healthy diet  Eat a diet that includes plenty of vegetables, fruits, low-fat dairy products, and lean protein. Do not eat a lot of foods that are high in solid fats, added sugars, or sodium. Maintain a healthy weight Body mass index (BMI) is used to identify weight problems. It estimates body fat based on height and weight. Your health care provider can help determine your BMI and help you achieve or maintain a healthy weight. Get regular exercise Get regular exercise. This is one of the most important things you can do for your health. Most adults should: Exercise for at least 150 minutes each week. The exercise should increase your heart rate and make you sweat (moderate-intensity exercise). Do strengthening exercises at least twice a week. This is in addition to the moderate-intensity exercise. Spend less time sitting. Even light physical activity can be beneficial. Watch cholesterol and blood lipids Have your blood tested for lipids and cholesterol at 83 years of age, then have this test every 5 years. Have your cholesterol levels checked more often if: Your lipid or cholesterol levels are high. You are older than 83 years of age. You are at high risk for heart disease. What should I know about cancer screening? Depending on your health history and family history, you may need to have cancer  screening at various ages. This may include screening for: Breast cancer. Cervical cancer. Colorectal cancer. Skin cancer. Lung cancer. What should I know about heart disease, diabetes, and high blood pressure? Blood pressure and heart disease High blood pressure causes heart disease and increases the risk of stroke. This is more likely to develop in people who have high blood pressure readings or are overweight. Have your blood pressure checked: Every 3-5 years if you are 67-50 years of age. Every year if you are 4 years old or older. Diabetes Have regular diabetes screenings. This checks your fasting blood sugar level. Have the screening done: Once every three years after age 8 if you are at a normal weight and have a low risk for diabetes. More often and at a younger age if you are overweight or have a high risk for diabetes. What should I know about preventing infection? Hepatitis B If you have a higher risk for hepatitis B, you should be screened for this virus. Talk with your health care provider to find out if you are at risk for hepatitis B infection. Hepatitis C Testing is recommended for: Everyone born from [birth year] through 1965. Anyone with known risk factors for hepatitis C. Sexually transmitted infections (STIs) Get screened for STIs, including gonorrhea and chlamydia, if: You are sexually active and are younger than 83 years of age. You are older than 83 years of age and your health care provider tells you that you are at risk for this type of infection. Your sexual activity  has changed since you were last screened, and you are at increased risk for chlamydia or gonorrhea. Ask your health care provider if you are at risk. Ask your health care provider about whether you are at high risk for HIV. Your health care provider may recommend a prescription medicine to help prevent HIV infection. If you choose to take medicine to prevent HIV, you should first get tested for HIV. You  should then be tested every 3 months for as long as you are taking the medicine. Pregnancy If you are about to stop having your period (premenopausal) and you may become pregnant, seek counseling before you get pregnant. Take 400 to 800 micrograms (mcg) of folic acid every day if you become pregnant. Ask for birth control (contraception) if you want to prevent pregnancy. Osteoporosis and menopause Osteoporosis is a disease in which the bones lose minerals and strength with aging. This can result in bone fractures. If you are 24 years old or older, or if you are at risk for osteoporosis and fractures, ask your health care provider if you should: Be screened for bone loss. Take a calcium  or vitamin D supplement to lower your risk of fractures. Be given hormone replacement therapy (HRT) to treat symptoms of menopause. Follow these instructions at home: Alcohol  use Do not drink alcohol  if: Your health care provider tells you not to drink. You are pregnant, may be pregnant, or are planning to become pregnant. If you drink alcohol : Limit how much you have to: 0-1 drink a day. Know how much alcohol  is in your drink. In the [place] one drink equals one 12 oz bottle of beer (355 mL), one 5 oz glass of wine (148 mL), or one 1 oz glass of hard liquor (44 mL). Lifestyle Do not use any products that contain nicotine or tobacco. These products include cigarettes, chewing tobacco, and vaping devices, such as e-cigarettes. If you need help quitting, ask your health care provider. Do not use street drugs. Do not share needles. Ask your health care provider for help if you need support or information about quitting drugs. General instructions Schedule regular health, dental, and eye exams. Stay current with your vaccines. Tell your health care provider if: You often feel depressed. You have ever been abused or do not feel safe at home. Summary Adopting a healthy lifestyle and getting preventive care are  important in promoting health and wellness. Follow your health care provider's instructions about healthy diet, exercising, and getting tested or screened for diseases. Follow your health care provider's instructions on monitoring your cholesterol and blood pressure. This information is not intended to replace advice given to you by your health care provider. Make sure you discuss any questions you have with your health care provider. Document Revised: 11/08/2020 Document Reviewed: 11/08/2020 Elsevier Patient Education  2024 [employer].

## 2024-03-19 ENCOUNTER — Encounter: Payer: Medicare Other | Admitting: Internal Medicine

## 2024-03-19 DIAGNOSIS — E7849 Other hyperlipidemia: Secondary | ICD-10-CM

## 2024-03-19 DIAGNOSIS — Z0000 Encounter for general adult medical examination without abnormal findings: Secondary | ICD-10-CM

## 2024-03-19 DIAGNOSIS — R944 Abnormal results of kidney function studies: Secondary | ICD-10-CM

## 2024-03-19 DIAGNOSIS — R739 Hyperglycemia, unspecified: Secondary | ICD-10-CM

## 2024-03-25 DIAGNOSIS — Z961 Presence of intraocular lens: Secondary | ICD-10-CM | POA: Diagnosis not present

## 2024-03-25 DIAGNOSIS — H43813 Vitreous degeneration, bilateral: Secondary | ICD-10-CM | POA: Diagnosis not present

## 2024-03-25 DIAGNOSIS — H26492 Other secondary cataract, left eye: Secondary | ICD-10-CM | POA: Diagnosis not present

## 2024-03-25 DIAGNOSIS — H35371 Puckering of macula, right eye: Secondary | ICD-10-CM | POA: Diagnosis not present

## 2024-03-25 DIAGNOSIS — H524 Presbyopia: Secondary | ICD-10-CM | POA: Diagnosis not present

## 2024-05-27 ENCOUNTER — Encounter: Payer: Self-pay | Admitting: Internal Medicine
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
## 2024-05-28 ENCOUNTER — Ambulatory Visit (INDEPENDENT_AMBULATORY_CARE_PROVIDER_SITE_OTHER): Admitting: Internal Medicine

## 2024-05-28 VITALS — BP 128/82 | HR 84 | Temp 98.3°F | Wt 153.0 lb

## 2024-05-28 DIAGNOSIS — R519 Headache, unspecified: Secondary | ICD-10-CM | POA: Diagnosis not present

## 2024-05-28 DIAGNOSIS — M79645 Pain in left finger(s): Secondary | ICD-10-CM | POA: Diagnosis not present

## 2024-05-28 NOTE — Patient Instructions (Addendum)
   Your headaches are likely related to the neck.    Medications changes include :   None    A CT scan of your head was ordered and someone will call you to schedule an appointment.    See orthopedics about your thumb arthritis.
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
## 2024-05-28 NOTE — Assessment & Plan Note (Signed)
 Subacute Having pain at the base of her left thumb.  It is slightly swollen.  It does affect her ability to do certain things and she wondered what could be done about this. Discussed this is likely related to osteoarthritis She was thinking about seeing her orthopedic, which I did advise-she will let me know if she needs a referral

## 2024-06-06 ENCOUNTER — Other Ambulatory Visit

## 2024-06-10 ENCOUNTER — Inpatient Hospital Stay: Admission: RE | Admit: 2024-06-10 | Discharge: 2024-06-10 | Attending: Internal Medicine

## 2024-06-10 DIAGNOSIS — R519 Headache, unspecified: Secondary | ICD-10-CM

## 2024-06-14 ENCOUNTER — Ambulatory Visit: Payer: Self-pay | Admitting: Internal Medicine

## 2024-07-15 ENCOUNTER — Encounter

## 2024-08-07 ENCOUNTER — Ambulatory Visit: Payer: Self-pay

## 2024-08-07 NOTE — Telephone Encounter (Signed)
 FYI Only or Action Required?: FYI only for provider: recommended UC.  Patient was last seen in primary care on 05/28/2024 by Geofm Glade PARAS, MD.  Called Nurse Triage reporting Rash.  Symptoms began several days ago.  Interventions attempted: OTC medications: caladrill lotion.  Symptoms are: gradually worsening.  Triage Disposition: See HCP Within 4 Hours (Or PCP Triage)  Patient/caregiver understands and will follow disposition?: Yes    Message from [place] G sent at 08/07/2024  5:15 PM EST  Reason for Triage: PT has itchy spots on stomach and discovered she has red itchy spots on back that has gotten bigger (pus filled) *looks like poison ivy     Reason for Disposition  Large or small blisters on skin (i.e., fluid filled bubbles or sacs)  Answer Assessment - Initial Assessment Questions Pt called to report worsening itchy, red rash with pustules on her L side of core onto her back x 2 days. Pt states pain is mild but itching especially on her core is moderate to severe. Pt treating itching with caladrill lotion as rash looks similar to poison oak. Discussed shingles and pt unsure if she received shingles vaccine. Discussed d/t shingles being contagious and symptoms being in full swing, recommended UC as pt lives 2 miles from [facility] and no appts in clinic until next week. Pt voiced understanding and appreciation. Pt to go to UC at this time.      1. APPEARANCE of RASH: What does the rash look like? (e.g., blisters, dry flaky skin, red spots, redness, sores)     Pus filled blisters with red itchy rash on L side front core and onto L side of back   2. SIZE: How big are the spots? (e.g., tip of pen, eraser, coin; inches, centimeters)     Rash does appear semi connect, space between front and back   3. LOCATION: Where is the rash located?     Core/back   4. COLOR: What color is the rash? (Note: It is difficult to assess rash color in people with darker-colored skin. When  this situation occurs, simply ask the caller to describe what they see.)     Red  5. ONSET: When did the rash begin?     Tuesday   6. FEVER: Do you have a fever? If Yes, ask: What is your temperature, how was it measured, and when did it start?     No   7. ITCHING: Does the rash itch? If Yes, ask: How bad is the itch? (Scale 1-10; or mild, moderate, severe)     Core around stomach is the worst, moderate   8. CAUSE: What do you think is causing the rash?     Pt unsure of shingles; pt states rash looks like poison oak, unsure of harold patch   9. MEDICINE FACTORS: Have you started any new medicines within the last 2 weeks? (e.g., antibiotics)      No   10. OTHER SYMPTOMS: Do you have any other symptoms? (e.g., dizziness, headache, sore throat, joint pain)       None  Protocols used: Rash or Redness - [facility]
